# Patient Record
Sex: Male | Born: 1965 | Race: White | Hispanic: No | Marital: Married | Smoking: Never smoker
Health system: Southern US, Community
[De-identification: ages and names within clinical notes are randomized; demographics above are authoritative.]

## PROBLEM LIST (undated history)

## (undated) DIAGNOSIS — I4891 Unspecified atrial fibrillation: Secondary | ICD-10-CM

## (undated) DIAGNOSIS — I639 Cerebral infarction, unspecified: Secondary | ICD-10-CM

## (undated) HISTORY — PX: APPENDECTOMY: SHX54

## (undated) HISTORY — PX: ELBOW FRACTURE SURGERY: SHX616

## (undated) HISTORY — PX: FEMUR FRACTURE SURGERY: SHX633

---

## 2008-03-05 ENCOUNTER — Encounter: Payer: Self-pay | Admitting: Emergency Medicine

## 2008-03-05 ENCOUNTER — Observation Stay (HOSPITAL_COMMUNITY): Admission: EM | Admit: 2008-03-05 | Discharge: 2008-03-06 | Payer: Self-pay | Admitting: Cardiology

## 2010-09-21 NOTE — Discharge Summary (Signed)
Carlos Nolan, FOXWORTH NO.:  [id number]   MEDICAL RECORD NO.:  [id number]          PATIENT TYPE:  INP   LOCATION:  4703                         FACILITY:  [facility]   PHYSICIAN:  Cristy Hilts. Jacinto Halim, MD       DATE OF BIRTH:  [birth year]   DATE OF ADMISSION:  03/05/2008  DATE OF DISCHARGE:  03/06/2008                               DISCHARGE SUMMARY   DISCHARGE DIAGNOSES:  1. Paroxysmal atrial fibrillation, direct current cardioversion to      sinus rhythm this admission.  2. Dyslipidemia with an HDL of 23 and LDL of 138.  3. 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   HOSPITAL COURSE:  The patient is a 45 year old who had a history of  atrial fibrillation related to a virus when he was 45 years old.  He  has had no recurrence.  He has had no other medical problems or serious  cardiac issues.  On Wednesday March 04, 2008, around 10:00 p.m., he  felt that his heart go out of rhythm.  He presented on March 05, 2008  to Urgent Care at [facility].  He was admitted and started on IV  heparin.  He did not require medicines for rate control as his rate was  controlled.  The patient was seen by Dr. Garen Lah on March 06, 2008.  He was cardioverted in the cath lab to sinus rhythm.  It was decided he  did not need a transesophageal echo.  He will be discharged on March 06, 2008, and will have an echocardiogram as an outpatient and then see  Dr. Garen Lah back.   His discharge medications were coated aspirin once a day.   LABORATORY DATA:  TSH is pending.  White count 7.7, hemoglobin 16.2,  hematocrit 46.3, and platelets 212.  Troponin is negative.  D-dimers  0.36.  Sodium 143, potassium 4.0, BUN 19, creatinine 0.9, HDL 23, and  LDL 138.  EKG shows atrial fibrillation, controlled ventricular  response.   DISPOSITION:  The patient is discharged in stable condition and I will  have an  echo next week and follow up with Dr. Garen Lah after that.      Abelino Derrick, P.A.      Cristy Hilts. Jacinto Halim, MD  Electronically Signed    LKK/MEDQ  D:  03/06/2008  T:  03/07/2008  Job:  [id number]   cc:   Sheliah Mends, MD

## 2010-09-24 NOTE — H&P (Signed)
Carlos Nolan, Carlos Nolan NO.:  [id number]   MEDICAL RECORD NO.:  [id number]          PATIENT TYPE:  INP   LOCATION:  4703                         FACILITY:  [facility]   PHYSICIAN:  Sheliah Mends, MD      DATE OF BIRTH:  [birth year]   DATE OF ADMISSION:  03/05/2008  DATE OF DISCHARGE:  03/06/2008                              HISTORY & PHYSICAL   INDICATION:  Paroxysmal atrial fibrillation.   PROCEDURE:  After informed consent was obtained, the patient was  scheduled for cardioversion.  He was started on conscious sedation using  fentanyl and Versed.  Subsequently, he received a direct synchronized  biphasic shock at 120 joules resulting in successful restoration of  sinus rhythm.   The procedure was completed without complications.      Sheliah Mends, MD  Electronically Signed     JE/MEDQ  D:  04/17/2008  T:  04/17/2008  Job:  [id number]

## 2011-02-08 LAB — DIFFERENTIAL
Basophils Absolute: 0
Basophils Absolute: 0.1
Basophils Relative: 1
Eosinophils Absolute: 0.6
Eosinophils Relative: 7 — ABNORMAL HIGH
Eosinophils Relative: 8 — ABNORMAL HIGH
Lymphocytes Relative: 24
Lymphocytes Relative: 32
Lymphs Abs: 1.9
Lymphs Abs: 2.3
Monocytes Absolute: 0.6
Monocytes Relative: 8
Neutro Abs: 3.6
Neutro Abs: 4.5
Neutrophils Relative %: 60

## 2011-02-08 LAB — BASIC METABOLIC PANEL
BUN: 19
CO2: 26
Sodium: 143

## 2011-02-08 LAB — COMPREHENSIVE METABOLIC PANEL
AST: 24
Albumin: 3.4 — ABNORMAL LOW
BUN: 13
CO2: 29
Calcium: 9.2
Creatinine, Ser: 0.99
GFR calc Af Amer: >60
GFR calc non Af Amer: >60

## 2011-02-08 LAB — POCT CARDIAC MARKERS
CKMB, poc: 1.2
Myoglobin, poc: 45.2
Troponin i, poc: <0.05

## 2011-02-08 LAB — LIPID PANEL
Cholesterol: 171
HDL: 23 — ABNORMAL LOW

## 2011-02-08 LAB — CBC
HCT: 44.6
HCT: 46.3
Hemoglobin: 16.2
MCHC: 34.7
MCHC: 35.1
MCV: 88.6
MCV: 90.6
Platelets: 175
Platelets: 212
RBC: 5.23
RDW: 12
WBC: 7.7

## 2011-02-08 LAB — CARDIAC PANEL(CRET KIN+CKTOT+MB+TROPI)
CK, MB: 1.2
Relative Index: INVALID
Relative Index: INVALID
Total CK: 43
Troponin I: 0.01
Troponin I: 0.01

## 2011-02-08 LAB — B-NATRIURETIC PEPTIDE (CONVERTED LAB): Pro B Natriuretic peptide (BNP): 160 — ABNORMAL HIGH

## 2011-02-08 LAB — BASIC METABOLIC PANEL WITH GFR
Calcium: 10.2
Chloride: 104
Creatinine, Ser: 0.9
GFR calc Af Amer: >60
GFR calc non Af Amer: >60
Glucose, Bld: 94
Potassium: 4

## 2011-02-08 LAB — TSH: TSH: 4.73 — ABNORMAL HIGH

## 2011-02-08 LAB — APTT: aPTT: 152 — ABNORMAL HIGH

## 2011-02-08 LAB — PROTIME-INR
INR: 1.2
Prothrombin Time: 15.1

## 2011-02-08 LAB — D-DIMER, QUANTITATIVE: D-Dimer, Quant: 0.36

## 2011-02-08 LAB — HEPARIN LEVEL (UNFRACTIONATED): Heparin Unfractionated: 0.59

## 2012-11-16 ENCOUNTER — Emergency Department: Payer: Self-pay | Admitting: Emergency Medicine

## 2012-11-16 LAB — CBC
HGB: 16.1 g/dL (ref 13.0–18.0)
MCHC: 34.4 g/dL (ref 32.0–36.0)
MCV: 90 fL (ref 80–100)
Platelet: 169 10*3/uL (ref 150–440)
RDW: 12.7 % (ref 11.5–14.5)

## 2012-11-16 LAB — BASIC METABOLIC PANEL
Chloride: 105 mmol/L (ref 98–107)
EGFR (African American): >60
EGFR (Non-African Amer.): >60
Glucose: 98 mg/dL (ref 65–99)

## 2012-11-16 LAB — TROPONIN I: Troponin-I: <0.02 ng/mL

## 2012-11-16 LAB — CK TOTAL AND CKMB (NOT AT ARMC): CK, Total: 71 U/L (ref 35–232)

## 2012-12-27 ENCOUNTER — Ambulatory Visit: Payer: Self-pay | Admitting: Cardiology

## 2012-12-27 LAB — PROTIME-INR: INR: 2.3

## 2013-01-14 ENCOUNTER — Ambulatory Visit: Payer: Self-pay | Admitting: Cardiology

## 2013-08-23 ENCOUNTER — Other Ambulatory Visit: Payer: Self-pay | Admitting: Cardiology

## 2015-04-24 HISTORY — PX: CARDIAC ELECTROPHYSIOLOGY STUDY AND ABLATION: SHX1294

## 2016-01-10 ENCOUNTER — Emergency Department
Admission: EM | Admit: 2016-01-10 | Discharge: 2016-01-10 | Payer: 59 | Attending: Emergency Medicine | Admitting: Emergency Medicine

## 2016-01-10 ENCOUNTER — Ambulatory Visit (HOSPITAL_COMMUNITY)
Admission: EM | Admit: 2016-01-10 | Discharge: 2016-01-11 | Disposition: A | Discharge status: Home / Self Care | Payer: 59 | Attending: Gastroenterology | Admitting: Gastroenterology

## 2016-01-10 ENCOUNTER — Encounter: Payer: Self-pay | Admitting: Emergency Medicine

## 2016-01-10 DIAGNOSIS — Y999 Unspecified external cause status: Secondary | ICD-10-CM | POA: Diagnosis not present

## 2016-01-10 DIAGNOSIS — I4891 Unspecified atrial fibrillation: Secondary | ICD-10-CM | POA: Insufficient documentation

## 2016-01-10 DIAGNOSIS — Y9389 Activity, other specified: Secondary | ICD-10-CM | POA: Insufficient documentation

## 2016-01-10 DIAGNOSIS — Y92511 Restaurant or cafe as the place of occurrence of the external cause: Secondary | ICD-10-CM | POA: Diagnosis not present

## 2016-01-10 DIAGNOSIS — T18128A Food in esophagus causing other injury, initial encounter: Secondary | ICD-10-CM | POA: Diagnosis not present

## 2016-01-10 DIAGNOSIS — K222 Esophageal obstruction: Secondary | ICD-10-CM | POA: Diagnosis not present

## 2016-01-10 DIAGNOSIS — X58XXXA Exposure to other specified factors, initial encounter: Secondary | ICD-10-CM | POA: Insufficient documentation

## 2016-01-10 DIAGNOSIS — Z79899 Other long term (current) drug therapy: Secondary | ICD-10-CM | POA: Diagnosis not present

## 2016-01-10 DIAGNOSIS — T189XXA Foreign body of alimentary tract, part unspecified, initial encounter: Secondary | ICD-10-CM | POA: Diagnosis present

## 2016-01-10 HISTORY — DX: Unspecified atrial fibrillation: I48.91

## 2016-01-10 LAB — CBC WITH DIFFERENTIAL/PLATELET
Basophils Absolute: 0 10*3/uL (ref 0–0.1)
Basophils Relative: 1 %
EOS ABS: 0.5 10*3/uL (ref 0–0.7)
EOS PCT: 8 %
HCT: 43.8 % (ref 40.0–52.0)
Hemoglobin: 15.5 g/dL (ref 13.0–18.0)
LYMPHS ABS: 1.7 10*3/uL (ref 1.0–3.6)
Lymphocytes Relative: 24 %
MCH: 31.8 pg (ref 26.0–34.0)
MCHC: 35.4 g/dL (ref 32.0–36.0)
MCV: 89.8 fL (ref 80.0–100.0)
MONOS PCT: 7 %
Monocytes Absolute: 0.5 10*3/uL (ref 0.2–1.0)
Neutro Abs: 4.4 10*3/uL (ref 1.4–6.5)
Neutrophils Relative %: 62 %
PLATELETS: 164 10*3/uL (ref 150–440)
RBC: 4.88 MIL/uL (ref 4.40–5.90)
RDW: 12.7 % (ref 11.5–14.5)
WBC: 7.2 10*3/uL (ref 3.8–10.6)

## 2016-01-10 LAB — BASIC METABOLIC PANEL
Anion gap: 5 (ref 5–15)
BUN: 21 mg/dL — AB (ref 6–20)
CO2: 28 mmol/L (ref 22–32)
CREATININE: 1.04 mg/dL (ref 0.61–1.24)
Calcium: 9.2 mg/dL (ref 8.9–10.3)
Chloride: 107 mmol/L (ref 101–111)
GFR calc Af Amer: >60 mL/min (ref >60)
Glucose, Bld: 150 mg/dL — ABNORMAL HIGH (ref 65–99)
Potassium: 3.6 mmol/L (ref 3.5–5.1)
SODIUM: 140 mmol/L (ref 135–145)

## 2016-01-10 MED ORDER — NITROGLYCERIN 0.4 MG SL SUBL
0.4000 mg | SUBLINGUAL_TABLET | Freq: Once | SUBLINGUAL | Status: AC
  Administered 2016-01-10: 0.4 mg via SUBLINGUAL
  Filled 2016-01-10: qty 1

## 2016-01-10 MED ORDER — GLUCAGON HCL RDNA (DIAGNOSTIC) 1 MG IJ SOLR
1.0000 mg | Freq: Once | INTRAMUSCULAR | Status: AC
  Administered 2016-01-10: 1 mg via INTRAVENOUS
  Filled 2016-01-10: qty 1

## 2016-01-10 NOTE — ED Notes (Signed)
Pt transported to [facility]

## 2016-01-10 NOTE — ED Notes (Signed)
Bed: [id number] Expected date:  Expected time:  Means of arrival:  Comments: Tx from [facility] impaction

## 2016-01-10 NOTE — ED Notes (Signed)
Pt states was eating at a restaurant and believes he has either a piece of steak or shrimp in his throat. Dr attempted to have pt drink warm coke to help it go down without success. Iv ordered.

## 2016-01-10 NOTE — ED Provider Notes (Signed)
[facility] Emergency Department Provider Note   ____________________________________________   First MD Initiated Contact with Patient 01/10/16 2044     (approximate)  I have reviewed the triage vital signs and the nursing notes.   HISTORY  Chief Complaint Foreign Body   HPI NAFTALI BIELENBERG is a 50 y.o. male with a history of atrial fibrillation who is presenting to the emergency department today after feeling of a steak chunk being stuck in his throat. He says he was eating at the [employer] in the hospital when he was swallowing a piece of steak and he felt like he became lodged in his neck. He says that he has tried water as well as cold Coca-Cola and has vomited both up.  Denies being on a blood thinner despite being a patient with atrial fibrillation. Says that he has vomited every time he tries to drink something.   Past Medical History:  Diagnosis Date  . Atrial fibrillation ([place])     There are no active problems to display for this patient.   Past Surgical History:  Procedure Laterality Date  . APPENDECTOMY    . ELBOW FRACTURE SURGERY    . FEMUR FRACTURE SURGERY      Prior to Admission medications   Not on File    Allergies Penicillins  No family history on file.  Social History Social History  Substance Use Topics  . Smoking status: Never Smoker  . Smokeless tobacco: Never Used  . Alcohol use Yes    Review of Systems Constitutional: No fever/chills Eyes: No visual changes. ENT: No sore throat. Cardiovascular: Denies chest pain. Respiratory: Denies shortness of breath. Gastrointestinal: No abdominal pain.   No diarrhea.  No constipation. Genitourinary: Negative for dysuria. Musculoskeletal: Negative for back pain. Skin: Negative for rash. Neurological: Negative for headaches, focal weakness or numbness.  10-point ROS otherwise negative.  ____________________________________________   PHYSICAL  EXAM:  VITAL SIGNS: ED Triage Vitals  Enc Vitals Group     BP 01/10/16 2028 128/85     Pulse Rate 01/10/16 2028 72     Resp 01/10/16 2028 18     Temp 01/10/16 2028 98 F (36.7 C)     Temp Source 01/10/16 2028 Oral     SpO2 01/10/16 2028 97 %     Weight 01/10/16 2028 235 lb (106.6 kg)     Height 01/10/16 2028 6\' 1"  (1.854 m)     Head Circumference --      Peak Flow --      Pain Score 01/10/16 2032 6     Pain Loc --      Pain Edu? --      Excl. in [place]? --     Constitutional: Alert and oriented. Well appearing and in no acute distress.Patient is controlling his secretions. Eyes: Conjunctivae are normal. PERRL. EOMI. Head: Atraumatic. Nose: No congestion/rhinnorhea. Mouth/Throat: Mucous membranes are moist.  Oropharynx non-erythematous. Neck: No stridor.   Cardiovascular: Normal rate, regular rhythm. Grossly normal heart sounds.   Respiratory: Normal respiratory effort.  No retractions. Lungs CTAB. Gastrointestinal: Soft and nontender. No distention.  Musculoskeletal: No lower extremity tenderness nor edema.  No joint effusions. Neurologic:  Normal speech and language. No gross focal neurologic deficits are appreciated. No gait instability. Skin:  Skin is warm, dry and intact. No rash noted. Psychiatric: Mood and affect are normal. Speech and behavior are normal.  ____________________________________________   LABS (all labs ordered are listed, but only abnormal results are displayed)  Labs Reviewed  CBC WITH DIFFERENTIAL/PLATELET  BASIC METABOLIC PANEL  TYPE AND SCREEN   ____________________________________________  EKG   ____________________________________________  RADIOLOGY   ____________________________________________   PROCEDURES  Procedure(s) performed:   Procedures  Critical Care performed:   ____________________________________________   INITIAL IMPRESSION / ASSESSMENT AND PLAN / ED COURSE  Pertinent labs & imaging results that were  available during my care of the patient were reviewed by me and considered in my medical decision making (see chart for details).  ----------------------------------------- 8:56 PM on 01/10/2016 -----------------------------------------  Patient admitted to drink warm Coca-Cola without success. Vomited back up forcefully. No GI coverage here at [facility] today. Discussed the case with the ENT doctor Dr. Linna Darner, who says that the patient would have greater benefit with GI and did procedure that he would have to do for the patient would incur much more risk and would be under general anesthesia. ----------------------------------------- 9:48 PM on 01/10/2016 -----------------------------------------  After glucagon and nitroglycerin the patient is still having the sensation of the foreign bite. I also tried having a repeat drinking diet: After the glucose and nitroglycerin without any resolution of the symptoms. He again forcefully vomited up this liquid. I discussed the case with Dr. Arvid Right at [facility] in [place] who says that the unassigned gastroenterologist for the evening for transfer is Dr. Silverio Decamp.  The latter document except for the patient for transfer to the [facility] emergency department. Dr. Leonette Monarch of the [facility] emergency department except the patient for transfer. I splinted the need for transfer to the patient as well as his wife was in the room as well. They understand the plan and are willing to comply. [place] the need for transfer. He spent them that we do not have gastric neurology at [facility] at this time.  Clinical Course     ____________________________________________   FINAL CLINICAL IMPRESSION(S) / ED DIAGNOSES  Esophageal impaction.    NEW MEDICATIONS STARTED DURING THIS VISIT:  New Prescriptions   No medications on file     Note:  This document was prepared using Dragon voice recognition software and may include unintentional dictation  errors.    Orbie Pyo, MD 01/10/16 2149

## 2016-01-10 NOTE — ED Notes (Signed)
Medications have not relieved symptoms. Pt to be transferred.

## 2016-01-10 NOTE — ED Triage Notes (Signed)
Pt presents to ED with possible foreign body in his throat. Pt states he was eating dinner at [facility] around 1800 and feels like there is a piece of steak stuck in his throat. Pt states he attempted to drink water to wash it down but had difficulty swallowing the water and ended up vomiting instead. Pt states he has vomited 4-5 times since onset. Pt states piece of meat has not moved. Pt currently speaking clearly with no increased work of breathing noted at this time. States he has to spit out his saliva. Appears uncomfortable.

## 2016-01-10 NOTE — ED Notes (Signed)
Report called to [place] - awaiting transport

## 2016-01-11 ENCOUNTER — Encounter (HOSPITAL_COMMUNITY): Payer: Self-pay | Admitting: Emergency Medicine

## 2016-01-11 ENCOUNTER — Encounter (HOSPITAL_COMMUNITY): Admission: EM | Disposition: A | Discharge status: Home / Self Care | Payer: Self-pay | Source: Home / Self Care

## 2016-01-11 DIAGNOSIS — T18128A Food in esophagus causing other injury, initial encounter: Secondary | ICD-10-CM | POA: Diagnosis not present

## 2016-01-11 DIAGNOSIS — W44F3XA Food entering into or through a natural orifice, initial encounter: Secondary | ICD-10-CM | POA: Insufficient documentation

## 2016-01-11 HISTORY — PX: ESOPHAGOGASTRODUODENOSCOPY: SHX5428

## 2016-01-11 SURGERY — EGD (ESOPHAGOGASTRODUODENOSCOPY)
Anesthesia: Moderate Sedation

## 2016-01-11 MED ORDER — MIDAZOLAM HCL 5 MG/ML IJ SOLN
INTRAMUSCULAR | Status: AC
Start: 2016-01-11 — End: 2016-01-11
  Filled 2016-01-11: qty 2

## 2016-01-11 MED ORDER — DIPHENHYDRAMINE HCL 50 MG/ML IJ SOLN
INTRAMUSCULAR | Status: AC
  Filled 2016-01-11: qty 1

## 2016-01-11 MED ORDER — BUTAMBEN-TETRACAINE-BENZOCAINE 2-2-14 % EX AERO
INHALATION_SPRAY | CUTANEOUS | Status: DC | PRN
  Administered 2016-01-11: 2 via TOPICAL

## 2016-01-11 MED ORDER — FENTANYL CITRATE (PF) 100 MCG/2ML IJ SOLN
INTRAMUSCULAR | Status: AC
  Filled 2016-01-11: qty 2

## 2016-01-11 MED ORDER — MIDAZOLAM HCL 10 MG/2ML IJ SOLN
INTRAMUSCULAR | Status: DC | PRN
  Administered 2016-01-11: 1 mg via INTRAVENOUS
  Administered 2016-01-11 (×3): 2 mg via INTRAVENOUS
  Administered 2016-01-11: 1 mg via INTRAVENOUS

## 2016-01-11 MED ORDER — DIPHENHYDRAMINE HCL 50 MG/ML IJ SOLN
INTRAMUSCULAR | Status: DC | PRN
  Administered 2016-01-11: 12.5 mg via INTRAVENOUS

## 2016-01-11 MED ORDER — FENTANYL CITRATE (PF) 100 MCG/2ML IJ SOLN
INTRAMUSCULAR | Status: DC | PRN
  Administered 2016-01-11 (×4): 25 ug via INTRAVENOUS

## 2016-01-11 NOTE — ED Triage Notes (Signed)
Pt transferred from [facility] for further evaluation and treatment of his food impaction in throat.  Pt reports that he has not been able to take anything down since "food" is stuck in his throat.  Arrived to ED A/Ox4, in no s/s acute respiratory distress noted.

## 2016-01-11 NOTE — ED Notes (Signed)
Upper Endoscopy completed at this time.

## 2016-01-11 NOTE — H&P (Signed)
Consultation  Referring Provider:     ER physician Primary Care Physician:  No PCP Per Patient Primary Gastroenterologist:     [facility]    Reason for Consultation:     Food impaction            HPI:   Carlos Nolan is a 50 y.o. male is transferred here from [facility] ER after he presented with episode of food impaction after eating steak this evening around 6 PM. He is not able to swallow anything since then. .Denies chest pain, abd pain, nausea or vomiting. No prior episodes  Past Medical History:  Diagnosis Date  . Atrial fibrillation [facility])     Past Surgical History:  Procedure Laterality Date  . APPENDECTOMY    . ELBOW FRACTURE SURGERY    . FEMUR FRACTURE SURGERY      History reviewed. No pertinent family history.   Social History  Substance Use Topics  . Smoking status: Never Smoker  . Smokeless tobacco: Never Used  . Alcohol use Yes    Prior to Admission medications   Medication Sig Start Date End Date Taking? Authorizing Provider  cefdinir (OMNICEF) 300 MG capsule Take 300 mg by mouth 2 (two) times daily. Last Monday one dose today only 10 days 01/04/16  Yes Historical Provider, MD  dofetilide (TIKOSYN) 500 MCG capsule Take 500 mcg by mouth 2 (two) times daily. 01/10/16  Yes Historical Provider, MD  HYDROcodone-homatropine (HYCODAN) 5-1.5 MG/5ML syrup Take 5 mLs by mouth every 4 (four) hours as needed for cough. 01/04/16  Yes Historical Provider, MD  magnesium gluconate (MAGONATE) 500 MG tablet Take 500 mg by mouth 2 (two) times daily.   Yes Historical Provider, MD    No current facility-administered medications for this encounter.    Current Outpatient Prescriptions  Medication Sig Dispense Refill  . cefdinir (OMNICEF) 300 MG capsule Take 300 mg by mouth 2 (two) times daily. Last Monday one dose today only 10 days    . dofetilide (TIKOSYN) 500 MCG capsule Take 500 mcg by mouth 2 (two) times daily.    Marland Kitchen HYDROcodone-homatropine (HYCODAN) 5-1.5 MG/5ML syrup Take 5 mLs  by mouth every 4 (four) hours as needed for cough.    . magnesium gluconate (MAGONATE) 500 MG tablet Take 500 mg by mouth 2 (two) times daily.      Allergies as of 01/10/2016 - Review Complete 01/10/2016  Allergen Reaction Noted  . Penicillins Rash 01/10/2016     Review of Systems:    This is positive for those things mentioned in the HPI All other review of systems are negative.       Physical Exam:  Vital signs in last 24 hours: Temp:  [98 F (36.7 C)-98.7 F (37.1 C)] 98 F (36.7 C) (09/04 0005) Pulse Rate:  [64-78] 73 (09/04 0130) Resp:  [16-20] 20 (09/04 0005) BP: (140-155)/(78-96) 155/91 (09/04 0138) SpO2:  [96 %-100 %] 99 % (09/04 0138) Weight:  [235 lb (106.6 kg)] 235 lb (106.6 kg) (09/04 0004)    General:  Well-developed, well-nourished and in no acute distress Eyes:  anicteric. Lungs: Clear to auscultation bilaterally. Heart:  S1S2, no rubs, murmurs, gallops. Abdomen:  soft, non-tender, and BS+.  Extremities:   no edema Neuro:  A&O x 3.  Psych:  appropriate mood and  Affect.   Data Reviewed:   LAB RESULTS:  Recent Labs  01/10/16 2147  WBC 7.2  HGB 15.5  HCT 43.8  PLT 164   BMET  Recent Labs  01/10/16 2147  NA 140  K 3.6  CL 107  CO2 28  GLUCOSE 150*  BUN 21*  CREATININE 1.04  CALCIUM 9.2    PREVIOUS ENDOSCOPIES:            None    Impression / Plan:   62 yr M here for acute episodes of food impaction Will proceed with emergent EGD The risks and benefits as well as alternatives of endoscopic procedure(s) have been discussed and reviewed. All questions answered. The patient agrees to proceed.       Damaris Hippo , MD [phone] Mon-Fri 8a-5p [phone] after 5p, weekends, holidays  @  01/11/2016, 2:06 AM

## 2016-01-11 NOTE — ED Notes (Signed)
Dr. Palumbo at bedside. 

## 2016-01-11 NOTE — ED Notes (Signed)
Gastroenterologist at bedside at this time.

## 2016-01-11 NOTE — ED Provider Notes (Signed)
MSE was initiated and I personally evaluated the patient and placed orders (if any) at  12:09 AM on January 11, 2016.  The patient appears stable so that the remainder of the MSE may be completed by another provider.  NCAT PERRL MMM, no visible impaction RRR CTAB NABS FROMX4  GI called   Keene Gilkey, MD 01/11/16 0010

## 2016-01-11 NOTE — ED Notes (Signed)
Bed: [id number] Expected date:  Expected time:  Means of arrival:  Comments: Pt in endo.Marland KitchenMarland Kitchen

## 2016-01-11 NOTE — Op Note (Signed)
[facility] Patient Name: Carlos Nolan Procedure Date: 01/11/2016 MRN: [id number] Attending MD: Mauri Pole , MD Date of Birth: Jan 23, 1966 CSN: [id number] Age: 50 Admit Type: Outpatient Procedure:                Upper GI endoscopy Indications:              Dysphagia, Removal of foreign body in the esophagus Providers:                Mauri Pole, MD, Sarah Monday RN, RN, Alfonso Patten, Technician Referring MD:              Medicines:                Fentanyl 200 micrograms IV, Midazolam 8 mg IV,                            Diphenhydramine [id number] mg IV Complications:            No immediate complications. Estimated Blood Loss:     Estimated blood loss was minimal. Procedure:                Pre-Anesthesia Assessment:                           - Prior to the procedure, a History and Physical                            was performed, and patient medications and                            allergies were reviewed. The patient's tolerance of                            previous anesthesia was also reviewed. The risks                            and benefits of the procedure and the sedation                            options and risks were discussed with the patient.                            All questions were answered, and informed consent                            was obtained. Prior Anticoagulants: The patient has                            taken no previous anticoagulant or antiplatelet                            agents. ASA Grade Assessment: II - A patient with  mild systemic disease. After reviewing the risks                            and benefits, the patient was deemed in                            satisfactory condition to undergo the procedure.                           After obtaining informed consent, the endoscope was                            passed under direct vision. Throughout the                 procedure, the patient's blood pressure, pulse, and                            oxygen saturations were monitored continuously. The                            Endoscope was introduced through the mouth, and                            advanced to the body of the stomach. The upper GI                            endoscopy was accomplished without difficulty. The                            patient tolerated the procedure well. Scope In: Scope Out: Findings:      Food was found in the impacted in the distal esophagus. Was able to       remove in piecemeal with [place] and roth net and subsequently was able       to gently push some part of the bolus down into stomach. Successful       removal of food was accomplished. Noted possible stricture ~16-17 mm at       EG junction, with minimal oozing of blood      A large amount of food (residue) was found in the gastric body and scope       was not advanced further. Impression:               - Food in the distal esophagus. Removal was                            successful. Possible distal esophageal stricture                           - A large amount of food (residue) in the stomach. Moderate Sedation:      Moderate (conscious) sedation was administered by the endoscopy nurse       and supervised by the endoscopist. The following parameters were       monitored: oxygen saturation, heart rate, blood pressure, and response       to care. Total physician intraservice time was  20 minutes. Recommendation:           - Patient has a contact number available for                            emergencies. The signs and symptoms of potential                            delayed complications were discussed with the                            patient. Return to normal activities tomorrow.                            Written discharge instructions were provided to the                            patient.                           - Mechanical soft diet  for 2 weeks.                           - Chopped diet indefinitely.                           - No ibuprofen, naproxen, or other non-steroidal                            anti-inflammatory drugs.                           - Continue present medications.                           - Repeat upper endoscopy in 2-4 weeks for                            evaluation and dilation of the possible distal                            esophageal stricture.                           - Return to GI clinic PRN. Procedure Code(s):        --- Professional ---                           [phone], 50, Esophagogastroduodenoscopy, flexible,                            transoral; with removal of foreign body(s)                           G0500, Moderate sedation services provided by the  same physician or other qualified health care                            professional performing a gastrointestinal                            endoscopic service that sedation supports,                            requiring the presence of an independent trained                            observer to assist in the monitoring of the                            patient's level of consciousness and physiological                            status; initial 15 minutes of intra-service time;                            patient age 19 years or older (additional time may                            be reported with [phone], as appropriate) Diagnosis Code(s):        --- Professional ---                           [phone], Food in esophagus causing other injury,                            initial encounter                           T18.2XXA, Foreign body in stomach, initial encounter                           R13.10, Dysphagia, unspecified                           T18.108A, Unspecified foreign body in esophagus                            causing other injury, initial encounter CPT copyright 2016 [employer].  All rights reserved. The codes documented in this report are preliminary and upon coder review may  be revised to meet current compliance requirements. Mauri Pole, MD 01/11/2016 2:45:01 AM This report has been signed electronically. Number of Addenda: 0

## 2016-01-12 ENCOUNTER — Encounter (HOSPITAL_COMMUNITY): Payer: Self-pay | Admitting: Gastroenterology

## 2016-01-19 ENCOUNTER — Ambulatory Visit (INDEPENDENT_AMBULATORY_CARE_PROVIDER_SITE_OTHER): Payer: 59 | Admitting: Gastroenterology

## 2016-01-19 ENCOUNTER — Encounter: Payer: Self-pay | Admitting: Gastroenterology

## 2016-01-19 VITALS — BP 120/80 | HR 60 | Ht 73.0 in | Wt 238.2 lb

## 2016-01-19 DIAGNOSIS — R1310 Dysphagia, unspecified: Secondary | ICD-10-CM

## 2016-01-19 DIAGNOSIS — T18128D Food in esophagus causing other injury, subsequent encounter: Secondary | ICD-10-CM | POA: Diagnosis not present

## 2016-01-19 NOTE — Patient Instructions (Signed)
You have been scheduled for an endoscopy. Please follow written instructions given to you at your visit today. If you use inhalers (even only as needed), please bring them with you on the day of your procedure. Your physician has requested that you go to www.startemmi.com and enter the access code given to you at your visit today. This web site gives a general overview about your procedure. However, you should still follow specific instructions given to you by our office regarding your preparation for the procedure.  Follow up as needed

## 2016-01-19 NOTE — Progress Notes (Signed)
Carlos Nolan    [id number]    [birth year]  Primary Care Physician:No PCP Per Patient  Referring Physician: No referring provider defined for this encounter.  Chief complaint:  H/o food impaction  HPI: 50 year old male here for follow-up visit after recent episode of acute food impaction on September 4 that was removed from distal esophagus,  noted to have a possible distal esophageal stricture near the EG junction. He has been very careful with his diet since that episode and has been avoiding meat. Denies any further episodes, no heartburn, regurgitation or reflux symptoms. Denies any nausea, vomiting, abdominal pain, melena or bright red blood per rectum     Outpatient Encounter Prescriptions as of 01/19/2016  Medication Sig  . dofetilide (TIKOSYN) 500 MCG capsule Take 500 mcg by mouth 2 (two) times daily.  . magnesium gluconate (MAGONATE) 500 MG tablet Take 500 mg by mouth 2 (two) times daily.  . [DISCONTINUED] cefdinir (OMNICEF) 300 MG capsule Take 300 mg by mouth 2 (two) times daily. Last Monday one dose today only 10 days  . [DISCONTINUED] HYDROcodone-homatropine (HYCODAN) 5-1.5 MG/5ML syrup Take 5 mLs by mouth every 4 (four) hours as needed for cough.   No facility-administered encounter medications on file as of 01/19/2016.     Allergies as of 01/19/2016 - Review Complete 01/11/2016  Allergen Reaction Noted  . Penicillins Rash 01/10/2016  . Quinidine Rash 03/26/2015    Past Medical History:  Diagnosis Date  . Atrial fibrillation [facility])     Past Surgical History:  Procedure Laterality Date  . APPENDECTOMY    . ELBOW FRACTURE SURGERY    . ESOPHAGOGASTRODUODENOSCOPY N/A 01/11/2016   Procedure: ESOPHAGOGASTRODUODENOSCOPY (EGD);  Surgeon: Mauri Pole, MD;  Location: [facility];  Service: Endoscopy;  Laterality: N/A;  . FEMUR FRACTURE SURGERY      History reviewed. No pertinent family history.  Social History   Social History  . Marital  status: Married    Spouse name: N/A  . Number of children: N/A  . Years of education: N/A   Occupational History  . Not on file.   Social History Main Topics  . Smoking status: Never Smoker  . Smokeless tobacco: Never Used  . Alcohol use Yes  . Drug use: No  . Sexual activity: Not on file   Other Topics Concern  . Not on file   Social History Narrative  . No narrative on file      Review of systems: Review of Systems  Constitutional: Negative for fever and chills.  HENT: Negative.   Eyes: Negative for blurred vision.  Respiratory: Negative for cough, shortness of breath and wheezing.   Cardiovascular: Negative for chest pain and palpitations.  Gastrointestinal: as per HPI Genitourinary: Negative for dysuria, urgency, frequency and hematuria.  Musculoskeletal: Negative for myalgias, back pain and joint pain.  Skin: Negative for itching and rash.  Neurological: Negative for dizziness, tremors, focal weakness, seizures and loss of consciousness.  Endo/Heme/Allergies: Positive for seasonal allergies.  Psychiatric/Behavioral: Negative for depression, suicidal ideas and hallucinations.  All other systems reviewed and are negative.   Physical Exam: Vitals:   01/19/16 1533  BP: 120/80  Pulse: 60   Body mass index is 31.43 kg/m. Gen:      No acute distress HEENT:  EOMI, sclera anicteric Neck:     No masses; no thyromegaly Lungs:    Clear to auscultation bilaterally; normal respiratory effort CV:  Regular rate and rhythm; no murmurs Abd:      + bowel sounds; soft, non-tender; no palpable masses, no distension Ext:    No edema; adequate peripheral perfusion Skin:      Warm and dry; no rash Neuro: alert and oriented x 3 Psych: normal mood and affect  Data Reviewed:  Reviewed chart in epic   Assessment and Plan/Recommendations:  50 year old male here for follow-up status post recent episode of acute food impaction that was dislodged with EGD. We will  proceed with scheduling repeat EGD with biopsies and possible dilation. Will need to exclude eosinophilic esophagitis Advise patient to continue to avoid meat, chew food well and small bites He is up-to-date with screening colonoscopy, normal per patient, was done with his local gastroenterologist last year 15 minutes was spent face-to-face with the patient. Greater than 50% of the time used for counseling as well as treatment plan and follow-up. She had multiple questions which were answered to her satisfaction  K. Denzil Magnuson , MD [phone] Mon-Fri 8a-5p [phone] after 5p, weekends, holidays  CC: No ref. provider found

## 2016-01-21 ENCOUNTER — Encounter: Payer: Self-pay | Admitting: Gastroenterology

## 2016-01-25 ENCOUNTER — Encounter: Payer: 59 | Admitting: Gastroenterology

## 2016-01-27 ENCOUNTER — Encounter: Payer: Self-pay | Admitting: Gastroenterology

## 2016-01-27 ENCOUNTER — Ambulatory Visit (AMBULATORY_SURGERY_CENTER): Payer: 59 | Admitting: Gastroenterology

## 2016-01-27 VITALS — BP 144/90 | HR 58 | Temp 98.7°F | Resp 15 | Ht 73.0 in | Wt 238.0 lb

## 2016-01-27 DIAGNOSIS — R1310 Dysphagia, unspecified: Secondary | ICD-10-CM

## 2016-01-27 DIAGNOSIS — K200 Eosinophilic esophagitis: Secondary | ICD-10-CM | POA: Diagnosis not present

## 2016-01-27 DIAGNOSIS — K222 Esophageal obstruction: Secondary | ICD-10-CM | POA: Diagnosis not present

## 2016-01-27 MED ORDER — LIDOCAINE VISCOUS 2 % MT SOLN: 20.0000 mL | OROMUCOSAL | 1 refills | Status: DC | PRN

## 2016-01-27 MED ORDER — SODIUM CHLORIDE 0.9 % IV SOLN: 500.0000 mL | INTRAVENOUS | Status: DC

## 2016-01-27 MED ORDER — OMEPRAZOLE 40 MG PO CPDR: 40.0000 mg | DELAYED_RELEASE_CAPSULE | Freq: Every day | ORAL | 11 refills | Status: DC

## 2016-01-27 NOTE — Progress Notes (Signed)
Called to room to assist during endoscopic procedure.  Patient ID and intended procedure confirmed with present staff. Received instructions for my participation in the procedure from the performing physician.  

## 2016-01-27 NOTE — Patient Instructions (Addendum)
Biopsies taken today. Result letter in your mail in 2-3 weeks. NO aspirin, ibuprofen, naproxen, or any other non-steriodal anti-inflammatory medications. Clear liquids for 1 hour, then soft diet rest of today. May resume your previous diet tomorrow. Call us with any questions or concerns. Thank you!!   YOU HAD AN ENDOSCOPIC PROCEDURE TODAY AT [facility]:   Refer to the procedure report that was given to you for any specific questions about what was found during the examination.  If the procedure report does not answer your questions, please call your gastroenterologist to clarify.  If you requested that your care partner not be given the details of your procedure findings, then the procedure report has been included in a sealed envelope for you to review at your convenience later.  YOU SHOULD EXPECT: Some feelings of bloating in the abdomen. Passage of more gas than usual.  Walking can help get rid of the air that was put into your GI tract during the procedure and reduce the bloating. If you had a lower endoscopy (such as a colonoscopy or flexible sigmoidoscopy) you may notice spotting of blood in your stool or on the toilet paper. If you underwent a bowel prep for your procedure, you may not have a normal bowel movement for a few days.  Please Note:  You might notice some irritation and congestion in your nose or some drainage.  This is from the oxygen used during your procedure.  There is no need for concern and it should clear up in a day or so.  SYMPTOMS TO REPORT IMMEDIATELY:    Following upper endoscopy (EGD)  Vomiting of blood or coffee ground material  New chest pain or pain under the shoulder blades  Painful or persistently difficult swallowing  New shortness of breath  Fever of 100F or higher  Black, tarry-looking stools  For urgent or emergent issues, a gastroenterologist can be reached at any hour by calling [phone].   DIET: dilation diet today, see  handout given. Drink plenty of fluids but you should avoid alcoholic beverages for 24 hours.  ACTIVITY:  You should plan to take it easy for the rest of today and you should NOT DRIVE or use heavy machinery until tomorrow (because of the sedation medicines used during the test).    FOLLOW UP: Our staff will call the number listed on your records the next business day following your procedure to check on you and address any questions or concerns that you may have regarding the information given to you following your procedure. If we do not reach you, we will leave a message.  However, if you are feeling well and you are not experiencing any problems, there is no need to return our call.  We will assume that you have returned to your regular daily activities without incident.  If any biopsies were taken you will be contacted by phone or by letter within the next 1-3 weeks.  Please call us at [phone] if you have not heard about the biopsies in 3 weeks.    SIGNATURES/CONFIDENTIALITY: You and/or your care partner have signed paperwork which will be entered into your electronic medical record.  These signatures attest to the fact that that the information above on your After Visit Summary has been reviewed and is understood.  Full responsibility of the confidentiality of this discharge information lies with you and/or your care-partner.

## 2016-01-27 NOTE — Op Note (Addendum)
[place] Patient Name: Carlos Nolan Procedure Date: 01/27/2016 1:15 PM MRN: [id number] Endoscopist: Mauri Pole , MD Age: 50 Referring MD:  Date of Birth: [birth year] Gender: Male Account #: [id number] Procedure:                Upper GI endoscopy Indications:              Dysphagia Medicines:                Monitored Anesthesia Care Procedure:                Pre-Anesthesia Assessment:                           - Prior to the procedure, a History and Physical                            was performed, and patient medications and                            allergies were reviewed. The patient's tolerance of                            previous anesthesia was also reviewed. The risks                            and benefits of the procedure and the sedation                            options and risks were discussed with the patient.                            All questions were answered, and informed consent                            was obtained. Prior Anticoagulants: The patient has                            taken no previous anticoagulant or antiplatelet                            agents. ASA Grade Assessment: II - A patient with                            mild systemic disease. After reviewing the risks                            and benefits, the patient was deemed in                            satisfactory condition to undergo the procedure.                           After obtaining informed consent, the endoscope was  passed under direct vision. Throughout the                            procedure, the patient's blood pressure, pulse, and                            oxygen saturations were monitored continuously. The                            Model GIF-HQ190 [id number] scope was introduced                            through the mouth, and advanced to the second part                            of duodenum. The upper GI endoscopy was                             accomplished without difficulty. The patient                            tolerated the procedure well. Scope In: Scope Out: Findings:                 Mucosal changes including ringed esophagus,                            longitudinal furrows and white plaques were found                            in the entire esophagus. Esophageal findings were                            graded using the Eosinophilic Esophagitis                            Endoscopic Reference Score (EoE-EREFS) as: Edema                            Grade 1 Present (decreased clarity or absence of                            vascular markings), Rings Grade 1 Mild (subtle                            circumferential ridges seen on esophageal                            distension), Exudates Grade 1 Mild (scattered white                            lesions involving less than 10 percent of the                            esophageal surface area), Furrows  Grade 1 Present                            (vertical lines with or without visible depth) and                            Stricture present (15 mm luminal diameter).                            Biopsies were obtained from the proximal and distal                            esophagus with cold forceps for histology of                            suspected eosinophilic esophagitis. A TTS dilator                            was passed through the scope. Dilation with an                            18-19-20 mm balloon dilator was performed to 20 mm.                            The dilation site was examined following endoscope                            reinsertion and showed moderate improvement in                            luminal narrowing. Estimated blood loss was minimal.                           The stomach was normal.                           The examined duodenum was normal. Complications:            No immediate complications. Estimated Blood Loss:      Estimated blood loss was minimal. Impression:               - Esophageal mucosal changes suspicious for                            eosinophilic esophagitis. Biopsied. Dilated.                           - Normal stomach.                           - Normal examined duodenum. Recommendation:           - Patient has a contact number available for                            emergencies. The signs and symptoms of potential  delayed complications were discussed with the                            patient. Return to normal activities tomorrow.                            Written discharge instructions were provided to the                            patient.                           - Resume previous diet.                           - Continue present medications.                           - Await pathology results.                           - No aspirin, ibuprofen, naproxen, or other                            non-steroidal anti-inflammatory drugs.                           - Repeat upper endoscopy for surveillance based on                            pathology results.                           -Omeprazole 40mg  daily, 30 minutes before breakfast                           - Return to GI clinic PRN. Mauri Pole, MD 01/27/2016 1:51:27 PM This report has been signed electronically.

## 2016-01-28 ENCOUNTER — Telehealth: Payer: Self-pay | Admitting: *Deleted

## 2016-01-28 NOTE — Telephone Encounter (Signed)
  Follow up Call-  Call back number 01/27/2016  Post procedure Call Back phone  # [phone]  Permission to leave phone message Yes  Some recent data might be hidden     Patient questions:  Do you have a fever, pain , or abdominal swelling? No. Pain Score  0 *  Have you tolerated food without any problems? Yes.    Have you been able to return to your normal activities? Yes.    Do you have any questions about your discharge instructions: Diet   No. Medications  No. Follow up visit  No.  Do you have questions or concerns about your Care? No.  Actions: * If pain score is 4 or above: No action needed, pain <4.  Patient c/o "soreness to upper stomach meets rib cage area". Patient denies any vomiting, or coughing up blood,pain or any other problems. Encouraged patient to call us back if symptoms don't improve. Pt understands.

## 2016-02-15 ENCOUNTER — Other Ambulatory Visit: Payer: Self-pay

## 2016-02-15 ENCOUNTER — Telehealth: Payer: Self-pay | Admitting: Gastroenterology

## 2016-02-15 MED ORDER — OMEPRAZOLE 40 MG PO CPDR: 40.0000 mg | DELAYED_RELEASE_CAPSULE | Freq: Two times a day (BID) | ORAL | 3 refills | Status: DC

## 2016-02-15 MED ORDER — FLUTICASONE PROPIONATE HFA 220 MCG/ACT IN AERO: INHALATION_SPRAY | RESPIRATORY_TRACT | 3 refills | Status: DC

## 2016-02-15 NOTE — Telephone Encounter (Signed)
Called pharmacy back, They wanted verification that the omeprazole script was increased to 40 mg twice a day, Called Beth to verify and she said from Path report Omeprazole was increased to 40 mg twice a day

## 2016-04-09 ENCOUNTER — Encounter: Payer: Self-pay | Admitting: Emergency Medicine

## 2016-04-09 ENCOUNTER — Emergency Department
Admission: EM | Admit: 2016-04-09 | Discharge: 2016-04-10 | Disposition: A | Discharge status: Home / Self Care | Payer: 59 | Attending: Student in an Organized Health Care Education/Training Program | Admitting: Student in an Organized Health Care Education/Training Program

## 2016-04-09 DIAGNOSIS — Z79899 Other long term (current) drug therapy: Secondary | ICD-10-CM | POA: Insufficient documentation

## 2016-04-09 DIAGNOSIS — M25511 Pain in right shoulder: Secondary | ICD-10-CM | POA: Insufficient documentation

## 2016-04-09 MED ORDER — KETOROLAC TROMETHAMINE 60 MG/2ML IM SOLN
60.0000 mg | Freq: Once | INTRAMUSCULAR | Status: AC
  Administered 2016-04-09: 60 mg via INTRAMUSCULAR
  Filled 2016-04-09: qty 2

## 2016-04-09 NOTE — ED Triage Notes (Signed)
Pt presents to ED c/o R shoulder pain. Has had similar 3-4 weeks ago and was successfully treated with steroids. States pain has returned and is worse today. Joint is tender to palpation, no injury that pt is aware of. 10/10 pain.

## 2016-04-09 NOTE — ED Provider Notes (Signed)
[facility] Emergency Department Provider Note  ____________________________________________  Time seen: Approximately 10:55 PM  I have reviewed the triage vital signs and the nursing notes.   HISTORY  Chief Complaint Shoulder Pain    HPI Carlos Nolan is a 50 y.o. male that presents with right shoulder pain that worsened today. Patient states he was having similar pain 1 month ago and went to urgent care. He was given steroids and felt significantly better. Patient states that he is unable to move his arm in any direction and pain as a 10 out of 10. Patient denies trauma. She denies any swelling over her shoulder. Patient denies any sensation changes in fingers. Patient works in an office at a computer.   Past Medical History:  Diagnosis Date  . Atrial fibrillation [facility])     Patient Active Problem List   Diagnosis Date Noted  . Food impaction of esophagus     Past Surgical History:  Procedure Laterality Date  . APPENDECTOMY    . ELBOW FRACTURE SURGERY    . ESOPHAGOGASTRODUODENOSCOPY N/A 01/11/2016   Procedure: ESOPHAGOGASTRODUODENOSCOPY (EGD);  Surgeon: Mauri Pole, MD;  Location: [facility];  Service: Endoscopy;  Laterality: N/A;  . FEMUR FRACTURE SURGERY      Prior to Admission medications   Medication Sig Start Date End Date Taking? Authorizing Provider  dofetilide (TIKOSYN) 500 MCG capsule Take 500 mcg by mouth 2 (two) times daily. 01/10/16   Historical Provider, MD  fluticasone (FLOVENT HFA) 220 MCG/ACT inhaler Use 2 puffs twice daily . Do not breathe in until the medicine is swallowed 02/15/16   Mauri Pole, MD  lidocaine (XYLOCAINE) 2 % solution Use as directed 20 mLs in the mouth or throat every 4 (four) hours as needed for mouth pain. 01/27/16   Mauri Pole, MD  magnesium gluconate (MAGONATE) 500 MG tablet Take 500 mg by mouth 2 (two) times daily.    Historical Provider, MD  omeprazole (PRILOSEC) 40 MG capsule Take 1  capsule (40 mg total) by mouth 2 (two) times daily. 02/15/16   Mauri Pole, MD    Allergies Penicillins and Quinidine  History reviewed. No pertinent family history.  Social History Social History  Substance Use Topics  . Smoking status: Never Smoker  . Smokeless tobacco: Never Used  . Alcohol use Yes     Comment: occasionally     Review of Systems  Constitutional: No fever/chills ENT: No upper respiratory complaints. Cardiovascular: no chest pain. Respiratory: no cough. No SOB. Gastrointestinal: No abdominal pain.  No nausea, no vomiting.   Musculoskeletal: Positive for right shoulder pain. Skin: Negative for rash, abrasions, lacerations, ecchymosis.   ____________________________________________   PHYSICAL EXAM:  VITAL SIGNS: ED Triage Vitals  Enc Vitals Group     BP 04/09/16 2229 (!) 158/87     Pulse Rate 04/09/16 2229 82     Resp 04/09/16 2229 20     Temp 04/09/16 2229 99.3 F (37.4 C)     Temp Source 04/09/16 2229 Oral     SpO2 04/09/16 2229 98 %     Weight 04/09/16 2231 240 lb (108.9 kg)     Height 04/09/16 2231 6\' 1"  (1.854 m)     Head Circumference --      Peak Flow --      Pain Score 04/09/16 2230 10     Pain Loc --      Pain Edu? --      Excl. in [place]? --  Constitutional: Alert and oriented. Well appearing and in no acute distress. Eyes: Conjunctivae are normal. PERRL. EOMI. Head: Atraumatic. ENT:      Ears:       Nose: No congestion/rhinnorhea.      Mouth/Throat: Mucous membranes are moist.  Neck: No stridor.  Cardiovascular: Normal rate, regular rhythm. Good peripheral circulation. 2+ radial pulses bilaterally. Respiratory: Normal respiratory effort without tachypnea or retractions. Lungs CTAB.  Musculoskeletal: Limited ROM of right shoulder. Patient unable to actively abduct, extend, or flex shoulder. Able to passively abduct, extend, and flex shoulder. Strength limited due to pain. Tenderness to palpation over deltoid and over  biceps tendon. Full range of motion of neck. No tenderness over the cervical vertebrae. Neurologic:  Normal speech and language. No gross focal neurologic deficits are appreciated.  Skin:  Skin is warm, dry and intact. No rash noted. Psychiatric: Mood and affect are normal. Speech and behavior are normal. Patient exhibits appropriate insight and judgement.   ____________________________________________   LABS (all labs ordered are listed, but only abnormal results are displayed)  Labs Reviewed - No data to display ____________________________________________  EKG   ____________________________________________  RADIOLOGY  ____________________________________________    PROCEDURES  Procedure(s) performed:    Procedures    Medications  ketorolac (TORADOL) injection 60 mg (60 mg Intramuscular Given 04/09/16 2249)     ____________________________________________   INITIAL IMPRESSION / ASSESSMENT AND PLAN / ED COURSE  Pertinent labs & imaging results that were available during my care of the patient were reviewed by me and considered in my medical decision making (see chart for details).  Review of the [place] CSRS was performed in accordance of the [place] prior to dispensing any controlled drugs.  Clinical Course     My assessment is that patient likely has a rotator cuff tear. Patient has significant pain with range of motion. Patient also could have impingement, tendinitis, bursitis. Patient was referred to orthopedics for evaluation. Patient will be discharged home with prescriptions for tramadol. Patient was advised to use ice and ibuprofen as needed. Patient was given sling for shoulder support. Patient is given ED precautions to return to the ED for any worsening or new symptoms.     ____________________________________________  FINAL CLINICAL IMPRESSION(S) / ED DIAGNOSES  Final diagnoses:  None      NEW MEDICATIONS STARTED DURING THIS VISIT:  New  Prescriptions   No medications on file        This chart was dictated using voice recognition software/Dragon. Despite best efforts to proofread, errors can occur which can change the meaning. Any change was purely unintentional.   Laban Emperor, PA-C 04/10/16 0013    Merlyn Lot, MD 04/10/16 [phone]

## 2016-04-10 MED ORDER — TRAMADOL HCL 50 MG PO TABS: 50.0000 mg | ORAL_TABLET | Freq: Four times a day (QID) | ORAL | 0 refills | Status: DC | PRN

## 2016-04-10 NOTE — Discharge Instructions (Signed)
Take ibuprofen as needed for pain.

## 2016-04-15 ENCOUNTER — Other Ambulatory Visit: Payer: Self-pay | Admitting: Family Medicine

## 2016-04-15 DIAGNOSIS — M778 Other enthesopathies, not elsewhere classified: Secondary | ICD-10-CM

## 2016-04-15 DIAGNOSIS — M7581 Other shoulder lesions, right shoulder: Principal | ICD-10-CM

## 2016-04-26 ENCOUNTER — Ambulatory Visit
Admission: RE | Admit: 2016-04-26 | Discharge: 2016-04-26 | Disposition: A | Discharge status: Home / Self Care | Payer: 59 | Source: Ambulatory Visit | Attending: Family Medicine | Admitting: Family Medicine

## 2016-04-26 DIAGNOSIS — M7581 Other shoulder lesions, right shoulder: Principal | ICD-10-CM

## 2016-04-26 DIAGNOSIS — M778 Other enthesopathies, not elsewhere classified: Secondary | ICD-10-CM

## 2016-05-05 ENCOUNTER — Encounter: Payer: 59 | Admitting: Gastroenterology

## 2016-05-06 ENCOUNTER — Ambulatory Visit
Admission: RE | Admit: 2016-05-06 | Discharge: 2016-05-06 | Disposition: A | Discharge status: Home / Self Care | Payer: 59 | Source: Ambulatory Visit | Attending: Family Medicine | Admitting: Family Medicine

## 2016-05-06 DIAGNOSIS — M7551 Bursitis of right shoulder: Secondary | ICD-10-CM | POA: Insufficient documentation

## 2016-05-06 DIAGNOSIS — M7581 Other shoulder lesions, right shoulder: Secondary | ICD-10-CM | POA: Insufficient documentation

## 2016-05-06 DIAGNOSIS — M75101 Unspecified rotator cuff tear or rupture of right shoulder, not specified as traumatic: Secondary | ICD-10-CM | POA: Diagnosis not present

## 2016-06-07 ENCOUNTER — Encounter: Payer: Self-pay | Admitting: *Deleted

## 2016-06-15 ENCOUNTER — Ambulatory Visit: Payer: 59 | Admitting: Anesthesiology

## 2016-06-15 ENCOUNTER — Ambulatory Visit
Admission: RE | Admit: 2016-06-15 | Discharge: 2016-06-15 | Disposition: A | Discharge status: Home / Self Care | Payer: 59 | Source: Ambulatory Visit | Attending: Surgery | Admitting: Surgery

## 2016-06-15 ENCOUNTER — Encounter
Admission: RE | Disposition: A | Discharge status: Home / Self Care | Payer: Self-pay | Source: Ambulatory Visit | Attending: Surgery

## 2016-06-15 DIAGNOSIS — S43431A Superior glenoid labrum lesion of right shoulder, initial encounter: Secondary | ICD-10-CM | POA: Insufficient documentation

## 2016-06-15 DIAGNOSIS — Z79899 Other long term (current) drug therapy: Secondary | ICD-10-CM | POA: Diagnosis not present

## 2016-06-15 DIAGNOSIS — M7581 Other shoulder lesions, right shoulder: Secondary | ICD-10-CM | POA: Insufficient documentation

## 2016-06-15 DIAGNOSIS — M7541 Impingement syndrome of right shoulder: Secondary | ICD-10-CM | POA: Insufficient documentation

## 2016-06-15 DIAGNOSIS — Z791 Long term (current) use of non-steroidal anti-inflammatories (NSAID): Secondary | ICD-10-CM | POA: Insufficient documentation

## 2016-06-15 DIAGNOSIS — M75111 Incomplete rotator cuff tear or rupture of right shoulder, not specified as traumatic: Secondary | ICD-10-CM | POA: Diagnosis present

## 2016-06-15 DIAGNOSIS — X58XXXA Exposure to other specified factors, initial encounter: Secondary | ICD-10-CM | POA: Diagnosis not present

## 2016-06-15 HISTORY — PX: SHOULDER ARTHROSCOPY: SHX128

## 2016-06-15 SURGERY — ARTHROSCOPY, SHOULDER
Anesthesia: Monitor Anesthesia Care | Site: Shoulder | Laterality: Right | Wound class: Clean

## 2016-06-15 MED ORDER — FENTANYL CITRATE (PF) 100 MCG/2ML IJ SOLN
INTRAMUSCULAR | Status: DC | PRN
  Administered 2016-06-15: 100 ug via INTRAVENOUS

## 2016-06-15 MED ORDER — MIDAZOLAM HCL 2 MG/2ML IJ SOLN
INTRAMUSCULAR | Status: DC | PRN
  Administered 2016-06-15: 2 mg via INTRAVENOUS

## 2016-06-15 MED ORDER — DEXTROSE 5 % IV SOLN: 900.0000 mg | Freq: Once | INTRAVENOUS | Status: DC

## 2016-06-15 MED ORDER — BUPIVACAINE-EPINEPHRINE 0.5% -1:200000 IJ SOLN
INTRAMUSCULAR | Status: DC | PRN
  Administered 2016-06-15: 30 mL

## 2016-06-15 MED ORDER — LIDOCAINE HCL (CARDIAC) 20 MG/ML IV SOLN
INTRAVENOUS | Status: DC | PRN
  Administered 2016-06-15: 40 mg via INTRAVENOUS

## 2016-06-15 MED ORDER — SODIUM CHLORIDE 0.9 % IV SOLN
900.0000 mg | Freq: Once | INTRAVENOUS | Status: AC
  Administered 2016-06-15: 900 mg via INTRAVENOUS

## 2016-06-15 MED ORDER — LACTATED RINGERS IV SOLN
INTRAVENOUS | Status: DC
  Administered 2016-06-15: 12:00:00 via INTRAVENOUS

## 2016-06-15 MED ORDER — ROPIVACAINE HCL 5 MG/ML IJ SOLN
INTRAMUSCULAR | Status: DC | PRN
  Administered 2016-06-15: 35 mL via PERINEURAL

## 2016-06-15 MED ORDER — PROPOFOL 500 MG/50ML IV EMUL
INTRAVENOUS | Status: DC | PRN
  Administered 2016-06-15: 100 ug/kg/min via INTRAVENOUS

## 2016-06-15 MED ORDER — OXYCODONE HCL 5 MG PO TABS: 5.0000 mg | ORAL_TABLET | ORAL | 0 refills | Status: DC | PRN

## 2016-06-15 SURGICAL SUPPLY — 36 items
BIT DRILL JUGRKNT W/NDL BIT2.9 (DRILL) IMPLANT
BLADE FULL RADIUS 3.5 (BLADE) ×3 IMPLANT
BUR ACROMIONIZER 4.0 (BURR) ×3 IMPLANT
CANNULA SHAVER 8MMX76MM (CANNULA) ×3 IMPLANT
CHLORAPREP W/TINT 26ML (MISCELLANEOUS) ×6 IMPLANT
COVER LIGHT HANDLE UNIVERSAL (MISCELLANEOUS) ×6 IMPLANT
COVER MAYO STAND STRL (DRAPES) ×3 IMPLANT
DRAPE IMP U-DRAPE 54X76 (DRAPES) ×6 IMPLANT
DRILL JUGGERKNOT W/NDL BIT 2.9 (DRILL)
GAUZE PETRO XEROFOAM 1X8 (MISCELLANEOUS) ×3 IMPLANT
GAUZE SPONGE 4X4 12PLY STRL (GAUZE/BANDAGES/DRESSINGS) ×3 IMPLANT
GLOVE BIO SURGEON STRL SZ8 (GLOVE) ×6 IMPLANT
GLOVE INDICATOR 8.0 STRL GRN (GLOVE) ×3 IMPLANT
GOWN STRL REUS W/ TWL LRG LVL3 (GOWN DISPOSABLE) ×1 IMPLANT
GOWN STRL REUS W/ TWL XL LVL3 (GOWN DISPOSABLE) ×1 IMPLANT
GOWN STRL REUS W/TWL LRG LVL3 (GOWN DISPOSABLE) ×2
GOWN STRL REUS W/TWL XL LVL3 (GOWN DISPOSABLE) ×2
IV LACTATED RINGER IRRG 3000ML (IV SOLUTION) ×4
IV LR IRRIG 3000ML ARTHROMATIC (IV SOLUTION) ×2 IMPLANT
MANIFOLD 4PT FOR NEPTUNE1 (MISCELLANEOUS) ×3 IMPLANT
MAT BLUE FLOOR 46X72 FLO (MISCELLANEOUS) ×3 IMPLANT
NEEDLE HYPO 21X1.5 SAFETY (NEEDLE) ×3 IMPLANT
NEEDLE REVERSE CUT 1/2 CRC (NEEDLE) ×3 IMPLANT
PACK ARTHROSCOPY SHOULDER (MISCELLANEOUS) ×3 IMPLANT
PAD GROUND ADULT SPLIT (MISCELLANEOUS) IMPLANT
SLING ARM LRG DEEP (SOFTGOODS) ×3 IMPLANT
STAPLER SKIN PROX 35W (STAPLE) ×3 IMPLANT
STRAP BODY AND KNEE 60X3 (MISCELLANEOUS) ×6 IMPLANT
SUT ETHIBOND 0 MO6 C/R (SUTURE) ×3 IMPLANT
SUT VIC AB 2-0 CT1 27 (SUTURE) ×2
SUT VIC AB 2-0 CT1 TAPERPNT 27 (SUTURE) ×1 IMPLANT
TAPE MICROFOAM 4IN (TAPE) ×3 IMPLANT
TUBING ARTHRO INFLOW-ONLY STRL (TUBING) ×3 IMPLANT
TUBING CONNECTING 10 (TUBING) ×2 IMPLANT
TUBING CONNECTING 10' (TUBING) ×1
WAND HAND CNTRL MULTIVAC 90 (MISCELLANEOUS) ×3 IMPLANT

## 2016-06-15 NOTE — Anesthesia Procedure Notes (Signed)
Anesthesia Regional Block:  Interscalene brachial plexus block  Pre-Anesthetic Checklist: ,, timeout performed, Correct Patient, Correct Site, Correct Laterality, Correct Procedure, Correct Position, site marked, Risks and benefits discussed,  Surgical consent,  Pre-op evaluation,  At surgeon's request and post-op pain management  Laterality: Right  Prep: chloraprep       Needles:  Injection technique: Single-shot  Needle Type: Stimiplex     Needle Length: 10cm 10 cm Needle Gauge: 21 and 21 G    Additional Needles:  Procedures: ultrasound guided (picture in chart) Interscalene brachial plexus block Narrative:  Start time: 06/15/2016 12:26 PM End time: 06/15/2016 12:32 PM Injection made incrementally with aspirations every 5 mL.  Performed by: Personally  Anesthesiologist: Ronelle Nigh  Additional Notes: Functioning IV was confirmed and monitors applied. Ultrasound guidance: relevant anatomy identified, needle position confirmed, local anesthetic spread visualized around nerve(s)., vascular puncture avoided.  Image printed for medical record.  Negative aspiration and no paresthesias; incremental administration of local anesthetic. The patient tolerated the procedure well. Vitals signes recorded in RN notes.

## 2016-06-15 NOTE — Anesthesia Preprocedure Evaluation (Signed)
Anesthesia Evaluation  Patient identified by MRN, date of birth, ID band Patient awake    Reviewed: Allergy & Precautions, H&P , NPO status , Patient's Chart, lab work & pertinent test results  Airway Mallampati: II  TM Distance: >3 FB Neck ROM: full    Dental no notable dental hx.    Pulmonary    Pulmonary exam normal        Cardiovascular Normal cardiovascular exam     Neuro/Psych    GI/Hepatic   Endo/Other    Renal/GU      Musculoskeletal   Abdominal   Peds  Hematology   Anesthesia Other Findings   Reproductive/Obstetrics                             Anesthesia Physical Anesthesia Plan  ASA: II  Anesthesia Plan: MAC and Regional   Post-op Pain Management:    Induction:   Airway Management Planned:   Additional Equipment:   Intra-op Plan:   Post-operative Plan:   Informed Consent: I have reviewed the patients History and Physical, chart, labs and discussed the procedure including the risks, benefits and alternatives for the proposed anesthesia with the patient or authorized representative who has indicated his/her understanding and acceptance.     Plan Discussed with:   Anesthesia Plan Comments:         Anesthesia Quick Evaluation

## 2016-06-15 NOTE — Anesthesia Postprocedure Evaluation (Signed)
Anesthesia Post Note  Patient: Carlos Nolan  Procedure(s) Performed: Procedure(s) (LRB): Arthroscopic labral debridement, arthroscopic subacromial decompression, and mini-open bursectomy with exploration of rotator cuff, right shoulder (Right)  Patient location during evaluation: PACU Anesthesia Type: Regional Level of consciousness: awake and alert and oriented Pain management: satisfactory to patient Vital Signs Assessment: post-procedure vital signs reviewed and stable Respiratory status: spontaneous breathing, nonlabored ventilation and respiratory function stable Cardiovascular status: blood pressure returned to baseline and stable Postop Assessment: Adequate PO intake and No signs of nausea or vomiting Anesthetic complications: no    Raliegh Ip

## 2016-06-15 NOTE — Progress Notes (Signed)
Assisted Mike Stella ANMD with right, ultrasound guided, supraclavicular block. Side rails up, monitors on throughout procedure. See vital signs in flow sheet. Tolerated Procedure well.  

## 2016-06-15 NOTE — Anesthesia Procedure Notes (Signed)
Procedure Name: MAC Date/Time: 06/15/2016 2:22 PM Performed by: Janna Arch Pre-anesthesia Checklist: Patient identified, Emergency Drugs available, Suction available and Patient being monitored Patient Re-evaluated:Patient Re-evaluated prior to induction

## 2016-06-15 NOTE — Transfer of Care (Signed)
Immediate Anesthesia Transfer of Care Note  Patient: Carlos Nolan  Procedure(s) Performed: Procedure(s): ARTHROSCOPY SHOULDER debridement and decompression with possible rotator cuff repair (Right)  Patient Location: PACU  Anesthesia Type: MAC, Regional  Level of Consciousness: awake, alert  and patient cooperative  Airway and Oxygen Therapy: Patient Spontanous Breathing and Patient connected to supplemental oxygen  Post-op Assessment: Post-op Vital signs reviewed, Patient's Cardiovascular Status Stable, Respiratory Function Stable, Patent Airway and No signs of Nausea or vomiting  Post-op Vital Signs: Reviewed and stable  Complications: No apparent anesthesia complications

## 2016-06-15 NOTE — Op Note (Signed)
06/15/2016  3:39 PM  Patient:   Carlos Nolan  Pre-Op Diagnosis:   Painful/tendinopathy with partial-thickness rotator cuff tear, right shoulder.  Postoperative diagnosis: Impingement/tendinopathy with Type I SLAP tear, right shoulder.  Procedure: Arthroscopic labral debridement, arthroscopic subacromial decompression, and mini-open bursectomy with exploration of rotator cuff, right shoulder.  Anesthesia: IV sedation with interscalene block placed preoperatively by the anesthesiologist.  Surgeon:   Pascal Lux, MD  Assistant:   Ilda Foil, PA-S  Findings: As above. The rotator cuff demonstrated some mild "blistering" on the bursal surface anterior insertional fibers of the supraspinatus but otherwise was in excellent condition, as was the biceps tendon. The labrum demonstrated moderate fraying superiorly from approximately the 10:00 position to the 1:00 position without frank detachment from the glenoid. The articular surfaces of the glenoid and humerus both were in excellent condition.  Complications: None  Fluids:   700 cc  Estimated blood loss: 5 cc  Tourniquet time: None  Drains: None  Closure: Staples   Brief clinical note: The patient is a 51 year old male with a history of right shoulder pain. The patient's symptoms have progressed despite medications, activity modification, etc. The patient's history and examination are consistent with impingement/tendinopathy with a possible rotator cuff tear. An MRI scan demonstrated severe high-grade tendinopathy with a possible bursal surface tear of the anterior insertional fibers of the supraspinatus. The patient presents at this time for definitive management of these shoulder symptoms.  Procedure: The patient underwent placement of an interscalene block by the anesthesiologist in the preoperative holding area before he was brought into the operating room and lain in the supine position. The patient  then underwent IV sedation before being repositioned in the beach chair position using the beach chair positioner. The right shoulder and upper extremity were prepped with ChloraPrep solution before being draped sterilely. Preoperative antibiotics were administered. A timeout was performed to confirm the proper surgical site before the expected portal sites and incision site were injected with 0.5% Sensorcaine with epinephrine. A posterior portal was created and the glenohumeral joint thoroughly inspected with the findings as described above. An anterior portal was created using an outside-in technique. The labrum and rotator cuff were further probed, again confirming the above-noted findings. The areas of labral fraying were debrided back to stable margins using the full-radius resector. The ArthroCare wand was inserted and used to obtain hemostasis as well as to "anneal" the labrum superiorly and anteriorly. The labrum again was probed, confirming the above-noted findings. In addition, the biceps was drawn into the joint and inspected carefully to be sure that there was no tendinopathic changes which might necessitate a biceps tenodesis. None were identified, so the instruments were removed from the joint after suctioning the excess fluid.  The camera was repositioned through the posterior portal into the subacromial space. A separate lateral portal was created using an outside-in technique. The 3.5 mm full-radius resector was introduced and used to perform a subtotal bursectomy. The ArthroCare wand was then inserted and used to remove the periosteal tissue off the undersurface of the anterior third of the acromion as well as to recess the coracoacromial ligament from its attachment along the anterior and lateral margins of the acromion. The 4.0 mm acromionizing bur was introduced and used to complete the decompression by removing the undersurface of the anterior third of the acromion. The full radius resector  was reintroduced to remove any residual bony debris before the ArthroCare wand was reintroduced to obtain hemostasis. The rotator cuff was  carefully inspected arthroscopically. There appeared to be an area of blistering on the bursal side of the anterior insertional fibers of the supraspinatus. Given the MRI findings and this finding, it was felt best to proceed with an open exploration. The instruments were then removed from the subacromial space after suctioning the excess fluid.  An approximately 4-5 cm incision was made over the anterolateral aspect of the shoulder beginning at the anterolateral corner of the acromion and extending distally in line with the bicipital groove. This incision was carried down through the subcutaneous tissues to expose the deltoid fascia. The raphae between the anterior and middle thirds was identified and this plane developed to provide access into the subacromial space. Additional bursal tissues were debrided sharply using Metzenbaum scissors. The rotator cuff was carefully inspected both visually and by palpation. A small area of blistering was again identified. Rather than ellipsing it out, it was felt best to proceed with a needle fenestration of the area to stimulate healing. This was accomplished with a 25-gauge needle.  The wound was copiously irrigated with sterile saline solution before the deltoid raphae was reapproximated using 2-0 Vicryl interrupted sutures. The subcutaneous tissues were closed in two layers using 2-0 Vicryl interrupted sutures before the skin was closed using staples. The portal sites also were closed using staples. A sterile bulky dressing was applied to the shoulder before the arm was placed into a standard shoulder sling. The patient was then awakened and returned to the recovery room in satisfactory condition after tolerating the procedure well.

## 2016-06-15 NOTE — H&P (Signed)
Paper H&P to be scanned into permanent record. H&P reviewed. No changes. 

## 2016-06-16 ENCOUNTER — Encounter: Payer: Self-pay | Admitting: Surgery

## 2019-01-08 DIAGNOSIS — U071 COVID-19: Secondary | ICD-10-CM

## 2019-01-08 HISTORY — DX: COVID-19: U07.1

## 2019-01-30 ENCOUNTER — Other Ambulatory Visit: Payer: Self-pay | Admitting: *Deleted

## 2019-01-30 DIAGNOSIS — Z20822 Contact with and (suspected) exposure to covid-19: Secondary | ICD-10-CM

## 2019-01-31 LAB — NOVEL CORONAVIRUS, NAA: SARS-CoV-2, NAA: DETECTED — AB

## 2019-08-06 ENCOUNTER — Other Ambulatory Visit: Payer: Self-pay | Admitting: Surgery

## 2019-08-15 ENCOUNTER — Encounter
Admission: RE | Admit: 2019-08-15 | Discharge: 2019-08-15 | Disposition: A | Discharge status: Home / Self Care | Payer: 59 | Source: Ambulatory Visit | Attending: Surgery | Admitting: Surgery

## 2019-08-15 ENCOUNTER — Other Ambulatory Visit: Payer: Self-pay

## 2019-08-15 NOTE — Patient Instructions (Signed)
Your procedure is scheduled on: 08/22/19 Report to [place]. To find out your arrival time please call [phone] between 1PM - 3PM on 08/21/19.  Remember: Instructions that are not followed completely may result in serious medical risk, up to and including death, or upon the discretion of your surgeon and anesthesiologist your surgery may need to be rescheduled.     _X__ 1. Do not eat food after midnight the night before your procedure.                 No gum chewing or hard candies. You may drink clear liquids up to 2 hours                 before you are scheduled to arrive for your surgery- DO not drink clear                 liquids within 2 hours of the start of your surgery.                 Clear Liquids include:  water, apple juice without pulp, clear carbohydrate                 drink such as Clearfast or Gatorade, Black Coffee or Tea (Do not add                 anything to coffee or tea). Diabetics water only  __X__2.  On the morning of surgery brush your teeth with toothpaste and water, you                 may rinse your mouth with mouthwash if you wish.  Do not swallow any              toothpaste of mouthwash.     _X__ 3.  No Alcohol for 24 hours before or after surgery.   _X__ 4.  Do Not Smoke or use e-cigarettes For 24 Hours Prior to Your Surgery.                 Do not use any chewable tobacco products for at least 6 hours prior to                 surgery.  ____  5.  Bring all medications with you on the day of surgery if instructed.   __X__  6.  Notify your doctor if there is any change in your medical condition      (cold, fever, infections).     Do not wear jewelry, make-up, hairpins, clips or nail polish. Do not wear lotions, powders, or perfumes.  Do not shave 48 hours prior to surgery. Men may shave face and neck. Do not bring valuables to the hospital.    [facility] is not responsible for any belongings or  valuables.  Contacts, dentures/partials or body piercings may not be worn into surgery. Bring a case for your contacts, glasses or hearing aids, a denture cup will be supplied. Leave your suitcase in the car. After surgery it may be brought to your room. For patients admitted to the hospital, discharge time is determined by your treatment team.   Patients discharged the day of surgery will not be allowed to drive home.   Please read over the following fact sheets that you were given:   MRSA Information  __X__ Take these medicines the morning of surgery with A SIP OF WATER:  1. NONE  2.   3.   4.  5.  6.  ____ Fleet Enema (as directed)   __X__ Use CHG Soap/SAGE wipes as directed  ____ Use inhalers on the day of surgery  ____ Stop metformin/Janumet/Farxiga 2 days prior to surgery    ____ Take 1/2 of usual insulin dose the night before surgery. No insulin the morning          of surgery.   ____ Stop Blood Thinners Coumadin/Plavix/Xarelto/Pleta/Pradaxa/Eliquis/Effient/Aspirin  on   Or contact your Surgeon, Cardiologist or Medical Doctor regarding  ability to stop your blood thinners  __X__ Stop Anti-inflammatories 7 days before surgery such as Advil, Ibuprofen, Motrin,  BC or Goodies Powder, Naprosyn, Naproxen, Aleve, Aspirin    __X__ Stop all herbal supplements, fish oil or vitamin E until after surgery.    ____ Bring C-Pap to the hospital.    ENSURE PRE SURGERY DRINK TO BE COMPLETED 2 HOURS PRIOR TO YOUR ARRIVAL THE DAY OF SURGERY  REVIEW THE INCENTIVE SPIROMETER INSTRUCTIONS

## 2019-08-20 ENCOUNTER — Encounter
Admission: RE | Admit: 2019-08-20 | Discharge: 2019-08-20 | Disposition: A | Discharge status: Home / Self Care | Payer: 59 | Source: Ambulatory Visit | Attending: Surgery | Admitting: Surgery

## 2019-08-20 ENCOUNTER — Other Ambulatory Visit: Payer: Self-pay

## 2019-08-20 DIAGNOSIS — Z01818 Encounter for other preprocedural examination: Secondary | ICD-10-CM | POA: Insufficient documentation

## 2019-08-20 DIAGNOSIS — Z20822 Contact with and (suspected) exposure to covid-19: Secondary | ICD-10-CM | POA: Diagnosis not present

## 2019-08-20 DIAGNOSIS — I4891 Unspecified atrial fibrillation: Secondary | ICD-10-CM | POA: Insufficient documentation

## 2019-08-20 LAB — CBC
HCT: 42.3 % (ref 39.0–52.0)
Hemoglobin: 15 g/dL (ref 13.0–17.0)
MCH: 31.3 pg (ref 26.0–34.0)
MCHC: 35.5 g/dL (ref 30.0–36.0)
MCV: 88.3 fL (ref 80.0–100.0)
Platelets: 198 10*3/uL (ref 150–400)
RBC: 4.79 MIL/uL (ref 4.22–5.81)
RDW: 12.1 % (ref 11.5–15.5)
WBC: 6.4 10*3/uL (ref 4.0–10.5)
nRBC: 0 % (ref 0.0–0.2)

## 2019-08-20 LAB — SARS CORONAVIRUS 2 (TAT 6-24 HRS): SARS Coronavirus 2: NEGATIVE

## 2019-08-21 MED ORDER — CLINDAMYCIN PHOSPHATE 900 MG/50ML IV SOLN
900.0000 mg | INTRAVENOUS | Status: AC
  Administered 2019-08-22: 900 mg via INTRAVENOUS

## 2019-08-22 ENCOUNTER — Ambulatory Visit: Payer: 59 | Admitting: Certified Registered"

## 2019-08-22 ENCOUNTER — Encounter: Payer: Self-pay | Admitting: Surgery

## 2019-08-22 ENCOUNTER — Encounter
Admission: RE | Disposition: A | Discharge status: Home / Self Care | Payer: Self-pay | Source: Home / Self Care | Attending: Surgery

## 2019-08-22 ENCOUNTER — Ambulatory Visit
Admission: RE | Admit: 2019-08-22 | Discharge: 2019-08-22 | Disposition: A | Discharge status: Home / Self Care | Payer: 59 | Attending: Surgery | Admitting: Surgery

## 2019-08-22 ENCOUNTER — Other Ambulatory Visit: Payer: Self-pay

## 2019-08-22 DIAGNOSIS — Z833 Family history of diabetes mellitus: Secondary | ICD-10-CM | POA: Diagnosis not present

## 2019-08-22 DIAGNOSIS — T8484XA Pain due to internal orthopedic prosthetic devices, implants and grafts, initial encounter: Secondary | ICD-10-CM | POA: Insufficient documentation

## 2019-08-22 DIAGNOSIS — Z888 Allergy status to other drugs, medicaments and biological substances status: Secondary | ICD-10-CM | POA: Diagnosis not present

## 2019-08-22 DIAGNOSIS — X58XXXA Exposure to other specified factors, initial encounter: Secondary | ICD-10-CM | POA: Diagnosis not present

## 2019-08-22 DIAGNOSIS — M25522 Pain in left elbow: Secondary | ICD-10-CM | POA: Diagnosis not present

## 2019-08-22 DIAGNOSIS — Z8249 Family history of ischemic heart disease and other diseases of the circulatory system: Secondary | ICD-10-CM | POA: Insufficient documentation

## 2019-08-22 DIAGNOSIS — Z91018 Allergy to other foods: Secondary | ICD-10-CM | POA: Insufficient documentation

## 2019-08-22 DIAGNOSIS — Z79899 Other long term (current) drug therapy: Secondary | ICD-10-CM | POA: Diagnosis not present

## 2019-08-22 DIAGNOSIS — I4891 Unspecified atrial fibrillation: Secondary | ICD-10-CM | POA: Diagnosis not present

## 2019-08-22 DIAGNOSIS — Z823 Family history of stroke: Secondary | ICD-10-CM | POA: Insufficient documentation

## 2019-08-22 DIAGNOSIS — Y939 Activity, unspecified: Secondary | ICD-10-CM | POA: Diagnosis not present

## 2019-08-22 DIAGNOSIS — Z880 Allergy status to penicillin: Secondary | ICD-10-CM | POA: Diagnosis not present

## 2019-08-22 DIAGNOSIS — F419 Anxiety disorder, unspecified: Secondary | ICD-10-CM | POA: Diagnosis not present

## 2019-08-22 HISTORY — PX: HARDWARE REMOVAL: SHX979

## 2019-08-22 SURGERY — REMOVAL, HARDWARE
Anesthesia: General | Site: Elbow | Laterality: Left

## 2019-08-22 MED ORDER — DEXAMETHASONE SODIUM PHOSPHATE 10 MG/ML IJ SOLN
INTRAMUSCULAR | Status: DC | PRN
  Administered 2019-08-22: 5 mg via INTRAVENOUS

## 2019-08-22 MED ORDER — GLYCOPYRROLATE 0.2 MG/ML IJ SOLN
INTRAMUSCULAR | Status: DC | PRN
  Administered 2019-08-22: .2 mg via INTRAVENOUS

## 2019-08-22 MED ORDER — CLINDAMYCIN PHOSPHATE 900 MG/50ML IV SOLN
INTRAVENOUS | Status: AC
  Filled 2019-08-22: qty 50

## 2019-08-22 MED ORDER — BUPIVACAINE HCL (PF) 0.5 % IJ SOLN
INTRAMUSCULAR | Status: AC
  Filled 2019-08-22: qty 30

## 2019-08-22 MED ORDER — LACTATED RINGERS IV SOLN
INTRAVENOUS | Status: DC
  Administered 2019-08-22: 125 mL/h via INTRAVENOUS

## 2019-08-22 MED ORDER — MIDAZOLAM HCL 5 MG/5ML IJ SOLN
INTRAMUSCULAR | Status: DC | PRN
  Administered 2019-08-22: 2 mg via INTRAVENOUS

## 2019-08-22 MED ORDER — HYDROCODONE-ACETAMINOPHEN 5-325 MG PO TABS: 1.0000 | ORAL_TABLET | Freq: Four times a day (QID) | ORAL | 0 refills | Status: DC | PRN

## 2019-08-22 MED ORDER — GLYCOPYRROLATE 0.2 MG/ML IJ SOLN
INTRAMUSCULAR | Status: AC
  Filled 2019-08-22: qty 1

## 2019-08-22 MED ORDER — FENTANYL CITRATE (PF) 100 MCG/2ML IJ SOLN: 25.0000 ug | INTRAMUSCULAR | Status: DC | PRN

## 2019-08-22 MED ORDER — DEXMEDETOMIDINE HCL IN NACL 200 MCG/50ML IV SOLN
INTRAVENOUS | Status: DC | PRN
  Administered 2019-08-22: 12 ug via INTRAVENOUS
  Administered 2019-08-22: 8 ug via INTRAVENOUS

## 2019-08-22 MED ORDER — DEXAMETHASONE SODIUM PHOSPHATE 10 MG/ML IJ SOLN
INTRAMUSCULAR | Status: AC
  Filled 2019-08-22: qty 1

## 2019-08-22 MED ORDER — FENTANYL CITRATE (PF) 100 MCG/2ML IJ SOLN
INTRAMUSCULAR | Status: DC | PRN
  Administered 2019-08-22: 100 ug via INTRAVENOUS

## 2019-08-22 MED ORDER — ACETAMINOPHEN 10 MG/ML IV SOLN
INTRAVENOUS | Status: DC | PRN
  Administered 2019-08-22: 1000 mg via INTRAVENOUS

## 2019-08-22 MED ORDER — FENTANYL CITRATE (PF) 100 MCG/2ML IJ SOLN
INTRAMUSCULAR | Status: AC
  Filled 2019-08-22: qty 2

## 2019-08-22 MED ORDER — MIDAZOLAM HCL 2 MG/2ML IJ SOLN
INTRAMUSCULAR | Status: AC
  Filled 2019-08-22: qty 2

## 2019-08-22 MED ORDER — ONDANSETRON HCL 4 MG/2ML IJ SOLN: 4.0000 mg | Freq: Once | INTRAMUSCULAR | Status: DC | PRN

## 2019-08-22 MED ORDER — PROPOFOL 10 MG/ML IV BOLUS
INTRAVENOUS | Status: DC | PRN
  Administered 2019-08-22: 200 mg via INTRAVENOUS

## 2019-08-22 MED ORDER — ONDANSETRON HCL 4 MG/2ML IJ SOLN
INTRAMUSCULAR | Status: AC
  Filled 2019-08-22: qty 2

## 2019-08-22 MED ORDER — BUPIVACAINE HCL (PF) 0.5 % IJ SOLN
INTRAMUSCULAR | Status: DC | PRN
  Administered 2019-08-22: 10 mL

## 2019-08-22 MED ORDER — FAMOTIDINE 20 MG PO TABS: 20.0000 mg | ORAL_TABLET | Freq: Once | ORAL | Status: AC

## 2019-08-22 MED ORDER — FAMOTIDINE 20 MG PO TABS
ORAL_TABLET | ORAL | Status: AC
  Administered 2019-08-22: 20 mg via ORAL
  Filled 2019-08-22: qty 1

## 2019-08-22 MED ORDER — KETOROLAC TROMETHAMINE 30 MG/ML IJ SOLN
INTRAMUSCULAR | Status: AC
  Filled 2019-08-22: qty 1

## 2019-08-22 MED ORDER — ACETAMINOPHEN 10 MG/ML IV SOLN
INTRAVENOUS | Status: AC
  Filled 2019-08-22: qty 100

## 2019-08-22 MED ORDER — ONDANSETRON HCL 4 MG/2ML IJ SOLN
INTRAMUSCULAR | Status: DC | PRN
  Administered 2019-08-22: 4 mg via INTRAVENOUS

## 2019-08-22 SURGICAL SUPPLY — 37 items
BLADE SURG 15 STRL LF DISP TIS (BLADE) IMPLANT
BLADE SURG 15 STRL SS (BLADE) ×3
BNDG COHESIVE 4X5 TAN STRL (GAUZE/BANDAGES/DRESSINGS) ×3 IMPLANT
BNDG ELASTIC 4X5.8 VLCR STR LF (GAUZE/BANDAGES/DRESSINGS) ×3 IMPLANT
BNDG ESMARK 4X12 TAN STRL LF (GAUZE/BANDAGES/DRESSINGS) ×3 IMPLANT
CANISTER SUCT 1200ML W/VALVE (MISCELLANEOUS) ×3 IMPLANT
CHLORAPREP W/TINT 26 (MISCELLANEOUS) ×4 IMPLANT
COVER WAND RF STERILE (DRAPES) ×3 IMPLANT
CUFF TOURN SGL QUICK 18X4 (TOURNIQUET CUFF) ×2 IMPLANT
CUFF TOURN SGL QUICK 24 (TOURNIQUET CUFF)
CUFF TRNQT CYL 24X4X16.5-23 (TOURNIQUET CUFF) IMPLANT
DRAPE FLUOR MINI C-ARM 54X84 (DRAPES) ×3 IMPLANT
DRAPE INCISE IOBAN 66X45 STRL (DRAPES) ×3 IMPLANT
DRAPE U-SHAPE 47X51 STRL (DRAPES) ×5 IMPLANT
ELECT CAUTERY BLADE 6.4 (BLADE) ×3 IMPLANT
ELECT REM PT RETURN 9FT ADLT (ELECTROSURGICAL) ×3
ELECTRODE REM PT RTRN 9FT ADLT (ELECTROSURGICAL) ×1 IMPLANT
GAUZE SPONGE 4X4 12PLY STRL (GAUZE/BANDAGES/DRESSINGS) ×3 IMPLANT
GAUZE XEROFORM 1X8 LF (GAUZE/BANDAGES/DRESSINGS) ×3 IMPLANT
GLOVE BIO SURGEON STRL SZ8 (GLOVE) ×6 IMPLANT
GLOVE INDICATOR 8.0 STRL GRN (GLOVE) ×3 IMPLANT
GOWN STRL REUS W/ TWL LRG LVL3 (GOWN DISPOSABLE) ×2 IMPLANT
GOWN STRL REUS W/ TWL XL LVL3 (GOWN DISPOSABLE) ×1 IMPLANT
GOWN STRL REUS W/TWL LRG LVL3 (GOWN DISPOSABLE) ×6
GOWN STRL REUS W/TWL XL LVL3 (GOWN DISPOSABLE) ×3
KIT TURNOVER KIT A (KITS) ×3 IMPLANT
NDL FILTER BLUNT 18X1 1/2 (NEEDLE) ×1 IMPLANT
NEEDLE FILTER BLUNT 18X 1/2SAF (NEEDLE) ×2
NEEDLE FILTER BLUNT 18X1 1/2 (NEEDLE) ×1 IMPLANT
NS IRRIG 1000ML POUR BTL (IV SOLUTION) ×3 IMPLANT
PACK EXTREMITY (MISCELLANEOUS) ×3 IMPLANT
STAPLER SKIN PROX 35W (STAPLE) ×5 IMPLANT
STOCKINETTE M/LG 89821 (MISCELLANEOUS) ×3 IMPLANT
SUT PROLENE 4 0 PS 2 18 (SUTURE) ×6 IMPLANT
SUT VIC AB 2-0 SH 27 (SUTURE) ×6
SUT VIC AB 2-0 SH 27XBRD (SUTURE) ×2 IMPLANT
SYR 10ML LL (SYRINGE) ×3 IMPLANT

## 2019-08-22 NOTE — Discharge Instructions (Addendum)
Orthopedic discharge instructions: Keep dressing dry and intact. Keep hand elevated above heart level. May shower after dressing removed on postop day 4 (Monday). Cover staples with Band-Aids after drying off. Apply ice to affected area frequently. Take ibuprofen 600-800 mg TID with meals for 7-10 days, then as necessary.    TID = three times per day (every 8 hours) Take ES Tylenol or pain medication as prescribed when needed.  Return for follow-up in 10-14 days or as scheduled.    AMBULATORY SURGERY  DISCHARGE INSTRUCTIONS   1) The drugs that you were given will stay in your system until tomorrow so for the next 24 hours you should not:  A) Drive an automobile B) Make any legal decisions C) Drink any alcoholic beverage   2) You may resume regular meals tomorrow.  Today it is better to start with liquids and gradually work up to solid foods.  You may eat anything you prefer, but it is better to start with liquids, then soup and crackers, and gradually work up to solid foods.   3) Please notify your doctor immediately if you have any unusual bleeding, trouble breathing, redness and pain at the surgery site, drainage, fever, or pain not relieved by medication.    4) Additional Instructions:        Please contact your physician with any problems or Same Day Surgery at [phone], Monday through Friday 6 am to 4 pm, or [place] [facility] number at [phone].

## 2019-08-22 NOTE — Anesthesia Preprocedure Evaluation (Addendum)
Anesthesia Evaluation  Patient identified by MRN, date of birth, ID band Patient awake    Reviewed: Allergy & Precautions, NPO status , Patient's Chart, lab work & pertinent test results  History of Anesthesia Complications Negative for: history of anesthetic complications  Airway Mallampati: III       Dental   Pulmonary neg sleep apnea, neg COPD, Not current smoker,           Cardiovascular (-) hypertension(-) Past MI and (-) CHF + dysrhythmias (s/p ablation) Atrial Fibrillation (-) Valvular Problems/Murmurs     Neuro/Psych neg Seizures    GI/Hepatic Neg liver ROS, neg GERD  ,  Endo/Other  neg diabetes  Renal/GU negative Renal ROS     Musculoskeletal   Abdominal   Peds  Hematology   Anesthesia Other Findings   Reproductive/Obstetrics                             Anesthesia Physical Anesthesia Plan  ASA: II  Anesthesia Plan: General   Post-op Pain Management:    Induction: Intravenous  PONV Risk Score and Plan: 2 and Ondansetron and Dexamethasone  Airway Management Planned: LMA  Additional Equipment:   Intra-op Plan:   Post-operative Plan:   Informed Consent: I have reviewed the patients History and Physical, chart, labs and discussed the procedure including the risks, benefits and alternatives for the proposed anesthesia with the patient or authorized representative who has indicated his/her understanding and acceptance.       Plan Discussed with:   Anesthesia Plan Comments:         Anesthesia Quick Evaluation

## 2019-08-22 NOTE — Op Note (Signed)
08/22/2019  2:36 PM  Patient:   Carlos Nolan  Pre-Op Diagnosis:   Painful retained surgical hardware, left elbow.  Post-Op Diagnosis:   Same  Procedure:   Hardware removal, left elbow.  Surgeon:   Pascal Lux, MD  Assistant:   None  Anesthesia:   General LMA  Findings:   As above.  Complications:   None  Fluids:   500 cc crystalloid  EBL:   0 cc  UOP:   None  TT:   30 minutes at 250 mmHg  Drains:   None  Closure:   Staples  Brief Clinical Note:   The patient is a 54 year old male with a several month history of progressively worsening posterior left elbow pain.  The patient is now 36 years status post an open reduction and internal fixation of a displaced olecranon fracture using K wires and tension band wire technique.  X-rays of the left elbow demonstrate that the fracture is well-healed, but suggest that the K wires are beginning to back out, causing the patient's symptoms.  The patient presents at this time for removal of the tension band wire and K wires from his left elbow.   Procedure:   The patient was brought into the operating room and lain in the supine position.  After adequate general laryngeal mask anesthesia was obtained, the patient's left upper extremity was prepped with ChloraPrep solution before being draped sterilely.  Preoperative antibiotics were administered.  A timeout was performed to verify the appropriate surgical site before the limb was exsanguinated with an Esmarch and the tourniquet inflated to 250 mmHg.  The proximal 3/4 of the previous posterior midline incision was reopened and carried down through the subcutaneous tissues to expose the posterior aspect of the proximal ulna.  The hardware was readily identified.  Each of the 2 K wires was removed easily before the tension band wire was cut in two places and removed in two pieces.  The wound was copiously irrigated with sterile saline solution before the subcutaneous tissues were closed  in two layers using 2-0 Vicryl interrupted sutures.  The skin was closed using staples before a total of 10 cc of 0.5% plain Sensorcaine was injected in and around the incision.  A sterile bulky dressing was applied to the elbow before the patient was awakened, extubated, and returned to the recovery room in satisfactory condition after tolerating the procedure well.

## 2019-08-22 NOTE — Anesthesia Postprocedure Evaluation (Signed)
Anesthesia Post Note  Patient: Carlos Nolan  Procedure(s) Performed: HARDWARE REMOVAL FROM LEFT ELBOW (Left Elbow)  Patient location during evaluation: PACU Anesthesia Type: General Level of consciousness: awake and alert Pain management: pain level controlled Vital Signs Assessment: post-procedure vital signs reviewed and stable Respiratory status: spontaneous breathing and respiratory function stable Cardiovascular status: stable Anesthetic complications: no     Last Vitals:  Vitals:   08/22/19 1451 08/22/19 1506  BP: 112/84 112/87  Pulse: 72 81  Resp: 10 16  Temp:    SpO2: 97% 94%    Last Pain:  Vitals:   08/22/19 1506  TempSrc:   PainSc: 0-No pain                 KEPHART,WILLIAM K

## 2019-08-22 NOTE — Transfer of Care (Signed)
Immediate Anesthesia Transfer of Care Note  Patient: Carlos Nolan  Procedure(s) Performed: HARDWARE REMOVAL FROM LEFT ELBOW (Left Elbow)  Patient Location: PACU  Anesthesia Type:General  Level of Consciousness: awake  Airway & Oxygen Therapy: Patient Spontanous Breathing and Patient connected to face mask oxygen  Post-op Assessment: Report given to RN and Post -op Vital signs reviewed and stable  Post vital signs: Reviewed  Last Vitals:  Vitals Value Taken Time  BP 90/61 08/22/19 1436  Temp    Pulse 86 08/22/19 1437  Resp 14 08/22/19 1437  SpO2 97 % 08/22/19 1437  Vitals shown include unvalidated device data.  Last Pain:  Vitals:   08/22/19 1150  TempSrc: Tympanic  PainSc: 0-No pain         Complications: No apparent anesthesia complications

## 2019-08-22 NOTE — Anesthesia Procedure Notes (Signed)
Procedure Name: LMA Insertion Performed by: Evaan Tidwell, CRNA Pre-anesthesia Checklist: Patient identified, Patient being monitored, Timeout performed, Emergency Drugs available and Suction available Patient Re-evaluated:Patient Re-evaluated prior to induction Oxygen Delivery Method: Circle system utilized Preoxygenation: Pre-oxygenation with 100% oxygen Induction Type: IV induction Ventilation: Mask ventilation without difficulty LMA: LMA inserted LMA Size: 4.5 Tube type: Oral Number of attempts: 1 Placement Confirmation: positive ETCO2 and breath sounds checked- equal and bilateral Tube secured with: Tape Dental Injury: Teeth and Oropharynx as per pre-operative assessment        

## 2019-08-22 NOTE — H&P (Signed)
Paper H&P to be scanned into permanent record. H&P reviewed and patient re-examined. No changes. 

## 2019-12-24 ENCOUNTER — Emergency Department
Admission: EM | Admit: 2019-12-24 | Discharge: 2019-12-24 | Disposition: A | Discharge status: Home / Self Care | Payer: 59 | Source: Home / Self Care | Attending: Emergency Medicine | Admitting: Emergency Medicine

## 2019-12-24 ENCOUNTER — Other Ambulatory Visit: Payer: Self-pay

## 2019-12-24 ENCOUNTER — Emergency Department: Payer: 59

## 2019-12-24 DIAGNOSIS — R202 Paresthesia of skin: Secondary | ICD-10-CM | POA: Insufficient documentation

## 2019-12-24 DIAGNOSIS — R42 Dizziness and giddiness: Secondary | ICD-10-CM | POA: Insufficient documentation

## 2019-12-24 LAB — URINALYSIS, COMPLETE (UACMP) WITH MICROSCOPIC
Bacteria, UA: NONE SEEN
Bilirubin Urine: NEGATIVE
Glucose, UA: NEGATIVE mg/dL
Hgb urine dipstick: NEGATIVE
Ketones, ur: NEGATIVE mg/dL
Leukocytes,Ua: NEGATIVE
Nitrite: NEGATIVE
Protein, ur: NEGATIVE mg/dL
Specific Gravity, Urine: 1.019 (ref 1.005–1.030)
Squamous Epithelial / HPF: NONE SEEN (ref 0–5)
pH: 6 (ref 5.0–8.0)

## 2019-12-24 LAB — BASIC METABOLIC PANEL
Anion gap: 8 (ref 5–15)
BUN: 20 mg/dL (ref 6–20)
CO2: 26 mmol/L (ref 22–32)
Calcium: 8.9 mg/dL (ref 8.9–10.3)
Chloride: 105 mmol/L (ref 98–111)
Creatinine, Ser: 0.93 mg/dL (ref 0.61–1.24)
GFR calc Af Amer: >60 mL/min (ref >60)
GFR calc non Af Amer: >60 mL/min (ref >60)
Glucose, Bld: 105 mg/dL — ABNORMAL HIGH (ref 70–99)
Potassium: 3.9 mmol/L (ref 3.5–5.1)
Sodium: 139 mmol/L (ref 135–145)

## 2019-12-24 LAB — CBC
HCT: 41.8 % (ref 39.0–52.0)
Hemoglobin: 15.2 g/dL (ref 13.0–17.0)
MCH: 31.5 pg (ref 26.0–34.0)
MCHC: 36.4 g/dL — ABNORMAL HIGH (ref 30.0–36.0)
MCV: 86.5 fL (ref 80.0–100.0)
Platelets: 186 10*3/uL (ref 150–400)
RBC: 4.83 MIL/uL (ref 4.22–5.81)
RDW: 11.9 % (ref 11.5–15.5)
WBC: 7.6 10*3/uL (ref 4.0–10.5)
nRBC: 0 % (ref 0.0–0.2)

## 2019-12-24 LAB — TROPONIN I (HIGH SENSITIVITY): Troponin I (High Sensitivity): 4 ng/L (ref <18)

## 2019-12-24 MED ORDER — DIAZEPAM 5 MG PO TABS
5.0000 mg | ORAL_TABLET | Freq: Once | ORAL | Status: AC
  Administered 2019-12-24: 5 mg via ORAL
  Filled 2019-12-24: qty 1

## 2019-12-24 MED ORDER — HYDROCHLOROTHIAZIDE 25 MG PO TABS: 25.0000 mg | ORAL_TABLET | Freq: Every day | ORAL | 2 refills | Status: DC

## 2019-12-24 MED ORDER — BUTALBITAL-APAP-CAFFEINE 50-325-40 MG PO TABS
2.0000 | ORAL_TABLET | Freq: Once | ORAL | Status: AC
  Administered 2019-12-24: 2 via ORAL
  Filled 2019-12-24: qty 2

## 2019-12-24 MED ORDER — BUTALBITAL-APAP-CAFFEINE 50-325-40 MG PO TABS: 1.0000 | ORAL_TABLET | Freq: Four times a day (QID) | ORAL | 0 refills | Status: AC | PRN

## 2019-12-24 NOTE — ED Provider Notes (Addendum)
[facility] Emergency Department Provider Note  Time seen: 8:50 PM  I have reviewed the triage vital signs and the nursing notes.   HISTORY  Chief Complaint Dizziness   HPI Carlos Nolan is a 54 y.o. male with a past medical history of migraines, presents emergency department for intermittent dizziness and tingling of his left leg.  According to the patient over the past 2 days or so he has been feeling intermittent episodes of dizziness/lightheadedness.  States they mostly occur upon standing.  He states also yesterday at times he was feeling some mild tingling in his left leg.  Patient states he is also been experiencing a migraine for the past 2 days but is not atypical for him.  Patient states he has in the past experience migraines that have caused some tingling.  Currently the patient appears well.  Denies any recent illnesses fever cough congestion shortness of breath nausea vomiting diarrhea.  Overall the patient appears quite well.  Past Medical History:  Diagnosis Date  . Atrial fibrillation [facility])     Patient Active Problem List   Diagnosis Date Noted  . Food impaction of esophagus     Past Surgical History:  Procedure Laterality Date  . APPENDECTOMY    . CARDIAC ELECTROPHYSIOLOGY STUDY AND ABLATION  04/24/2015  . ELBOW FRACTURE SURGERY    . ESOPHAGOGASTRODUODENOSCOPY N/A 01/11/2016   Procedure: ESOPHAGOGASTRODUODENOSCOPY (EGD);  Surgeon: Mauri Pole, MD;  Location: [facility];  Service: Endoscopy;  Laterality: N/A;  . FEMUR FRACTURE SURGERY    . HARDWARE REMOVAL Left 08/22/2019   Procedure: HARDWARE REMOVAL FROM LEFT ELBOW;  Surgeon: Corky Mull, MD;  Location: [facility] ORS;  Service: Orthopedics;  Laterality: Left;  . SHOULDER ARTHROSCOPY Right 06/15/2016   Procedure: Arthroscopic labral debridement, arthroscopic subacromial decompression, and mini-open bursectomy with exploration of rotator cuff, right shoulder;  Surgeon: Corky Mull, MD;   Location: [place];  Service: Orthopedics;  Laterality: Right;    Prior to Admission medications   Medication Sig Start Date End Date Taking? Authorizing Provider  HYDROcodone-acetaminophen (NORCO) 5-325 MG tablet Take 1-2 tablets by mouth every 6 (six) hours as needed for moderate pain. MAXIMUM TOTAL ACETAMINOPHEN DOSE IS 4000 MG PER DAY 08/22/19   Poggi, Marshall Cork, MD    Allergies  Allergen Reactions  . Banana Swelling    throat  . Penicillins Rash    Has patient had a PCN reaction causing immediate rash, facial/tongue/throat swelling, SOB or lightheadedness with hypotension:unsure Has patient had a PCN reaction causing severe rash involving mucus membranes or skin necrosis:No Has patient had a PCN reaction that required hospitalization:No Has patient had a PCN reaction occurring within the last 10 years:No If all of the above answers are "NO", then may proceed with Cephalosporin use.   . Quinidine Rash    History reviewed. No pertinent family history.  Social History Social History   Tobacco Use  . Smoking status: Never Smoker  . Smokeless tobacco: Never Used  Vaping Use  . Vaping Use: Never used  Substance Use Topics  . Alcohol use: Yes    Alcohol/week: 2.0 standard drinks    Types: 2 Cans of beer per week    Comment: occasionally  . Drug use: No    Review of Systems Constitutional: Negative for fever Cardiovascular: Negative for chest pain. Respiratory: Negative for shortness of breath. Gastrointestinal: Negative for abdominal pain, vomiting and diarrhea. Musculoskeletal: Negative for musculoskeletal complaints Neurological: Moderate headache.  Tingling of  his left lower extremity yesterday but none today. All other ROS negative  ____________________________________________   PHYSICAL EXAM:  VITAL SIGNS: ED Triage Vitals [12/24/19 1727]  Enc Vitals Group     BP (!) 156/96     Pulse Rate 74     Resp 18     Temp 99.4 F (37.4 C)     Temp Source  Oral     SpO2 98 %     Weight 240 lb (108.9 kg)     Height      Head Circumference      [place] Flow      Pain Score 3     Pain Loc      Pain Edu?      Excl. in [place]?     Constitutional: Alert and oriented. Well appearing and in no distress. Eyes: Normal exam ENT      Head: Normocephalic and atraumatic.      Mouth/Throat: Mucous membranes are moist. Cardiovascular: Normal rate, regular rhythm. Respiratory: Normal respiratory effort without tachypnea nor retractions. Breath sounds are clear  Gastrointestinal: Soft and nontender. No distention.   Musculoskeletal: Nontender with normal range of motion in all extremities.  Neurologic:  Normal speech and language. No gross focal neurologic deficits Skin:  Skin is warm, dry and intact.  Psychiatric: Mood and affect are normal.  ____________________________________________    EKG  EKG viewed and interpreted by myself shows a sinus rhythm at 73 bpm with a narrow QRS, normal axis, normal intervals, nonspecific but no concerning ST changes.    INITIAL IMPRESSION / ASSESSMENT AND PLAN / ED COURSE  Pertinent labs & imaging results that were available during my care of the patient were reviewed by me and considered in my medical decision making (see chart for details).   Patient presents emergency department for intermittent dizziness and lightheadedness especially upon standing for the last 2 days.  Overall the patient appears well.  He does state occasional tingling in his left lower extremity yesterday but none today.  Patient does state moderate headache over the past 2 days.  History of migraines in the past.  Patient states he has had tingling at times in the past with his migraines.  Differential would include complex/complicated migraine, dehydration, orthostatic hypotension.  We will obtain a CT scan of the head, add on a cardiac enzyme and continue to closely monitor.  Patient agreeable to plan of care  Patient's lab work thus far is  overall reassuring.  EKG reassuring.  Patient CT scan shows age-indeterminate ischemic changes. We will proceed with MRI to further evaluate. Patient agreeable to plan of care. We will dose Valium as the patient states he is claustrophobic.  MRI is negative for acute abnormality.  Patient will follow up with his PCP.  Carlos Nolan was evaluated in Emergency Department on 12/24/2019 for the symptoms described in the history of present illness. He was evaluated in the context of the global COVID-19 pandemic, which necessitated consideration that the patient might be at risk for infection with the SARS-CoV-2 virus that causes COVID-19. Institutional protocols and algorithms that pertain to the evaluation of patients at risk for COVID-19 are in a state of rapid change based on information released by regulatory bodies including the CDC and federal and state organizations. These policies and algorithms were followed during the patient's care in the ED.  ____________________________________________   FINAL CLINICAL IMPRESSION(S) / ED DIAGNOSES  Dizziness   Harvest Dark, MD 12/24/19 2244  Harvest Dark, MD 12/24/19 2314

## 2019-12-24 NOTE — ED Triage Notes (Signed)
Pt arrives [facility] for c/o dizziness and lightheadedness that started mid day yesterday. Pt reports dizziness worse when standing up. Pt speech clear, NAD, skin warm and dry. Pt reports left foot felt like it was tingling yesterday but states this has subsided. PT hand grips strong and equal, face symmetrical, gait steady from lobby.

## 2019-12-24 NOTE — ED Notes (Signed)
Pt presents w/ dizziness w/ standing since yesterday. Pt states he is light headed w/ sitting at bedside now. Pt also c/o intermittent left sided weakness. Pt denies any med hx other than orthopedic surgeries.

## 2019-12-25 ENCOUNTER — Inpatient Hospital Stay
Admission: EM | Admit: 2019-12-25 | Discharge: 2019-12-26 | DRG: 066 | Disposition: A | Discharge status: Home Health | Payer: 59 | Attending: Hospitalist | Admitting: Hospitalist

## 2019-12-25 ENCOUNTER — Encounter: Payer: Self-pay | Admitting: Emergency Medicine

## 2019-12-25 ENCOUNTER — Emergency Department: Payer: 59

## 2019-12-25 ENCOUNTER — Other Ambulatory Visit: Payer: Self-pay

## 2019-12-25 DIAGNOSIS — Z888 Allergy status to other drugs, medicaments and biological substances status: Secondary | ICD-10-CM

## 2019-12-25 DIAGNOSIS — G43909 Migraine, unspecified, not intractable, without status migrainosus: Secondary | ICD-10-CM | POA: Diagnosis present

## 2019-12-25 DIAGNOSIS — Z79891 Long term (current) use of opiate analgesic: Secondary | ICD-10-CM

## 2019-12-25 DIAGNOSIS — R519 Headache, unspecified: Secondary | ICD-10-CM

## 2019-12-25 DIAGNOSIS — I639 Cerebral infarction, unspecified: Principal | ICD-10-CM | POA: Diagnosis present

## 2019-12-25 DIAGNOSIS — I4891 Unspecified atrial fibrillation: Secondary | ICD-10-CM | POA: Diagnosis present

## 2019-12-25 DIAGNOSIS — Z91018 Allergy to other foods: Secondary | ICD-10-CM

## 2019-12-25 DIAGNOSIS — Z880 Allergy status to penicillin: Secondary | ICD-10-CM

## 2019-12-25 DIAGNOSIS — Z9889 Other specified postprocedural states: Secondary | ICD-10-CM

## 2019-12-25 DIAGNOSIS — R200 Anesthesia of skin: Secondary | ICD-10-CM

## 2019-12-25 DIAGNOSIS — I6381 Other cerebral infarction due to occlusion or stenosis of small artery: Secondary | ICD-10-CM | POA: Diagnosis present

## 2019-12-25 DIAGNOSIS — I10 Essential (primary) hypertension: Secondary | ICD-10-CM | POA: Diagnosis not present

## 2019-12-25 DIAGNOSIS — Z8679 Personal history of other diseases of the circulatory system: Secondary | ICD-10-CM

## 2019-12-25 DIAGNOSIS — Z20822 Contact with and (suspected) exposure to covid-19: Secondary | ICD-10-CM | POA: Diagnosis present

## 2019-12-25 DIAGNOSIS — Z79899 Other long term (current) drug therapy: Secondary | ICD-10-CM

## 2019-12-25 LAB — URINALYSIS, COMPLETE (UACMP) WITH MICROSCOPIC
Bacteria, UA: NONE SEEN
Bilirubin Urine: NEGATIVE
Glucose, UA: NEGATIVE mg/dL
Hgb urine dipstick: NEGATIVE
Ketones, ur: NEGATIVE mg/dL
Leukocytes,Ua: NEGATIVE
Nitrite: NEGATIVE
Protein, ur: NEGATIVE mg/dL
Specific Gravity, Urine: 1.017 (ref 1.005–1.030)
WBC, UA: NONE SEEN WBC/hpf (ref 0–5)
pH: 5 (ref 5.0–8.0)

## 2019-12-25 LAB — COMPREHENSIVE METABOLIC PANEL
ALT: 26 U/L (ref 0–44)
AST: 20 U/L (ref 15–41)
Albumin: 4.2 g/dL (ref 3.5–5.0)
Alkaline Phosphatase: 101 U/L (ref 38–126)
Anion gap: 10 (ref 5–15)
BUN: 19 mg/dL (ref 6–20)
CO2: 24 mmol/L (ref 22–32)
Calcium: 8.9 mg/dL (ref 8.9–10.3)
Chloride: 99 mmol/L (ref 98–111)
Creatinine, Ser: 1.06 mg/dL (ref 0.61–1.24)
GFR calc Af Amer: >60 mL/min (ref >60)
GFR calc non Af Amer: >60 mL/min (ref >60)
Glucose, Bld: 120 mg/dL — ABNORMAL HIGH (ref 70–99)
Potassium: 3.5 mmol/L (ref 3.5–5.1)
Sodium: 133 mmol/L — ABNORMAL LOW (ref 135–145)
Total Bilirubin: 1.1 mg/dL (ref 0.3–1.2)
Total Protein: 7.6 g/dL (ref 6.5–8.1)

## 2019-12-25 LAB — CBC WITH DIFFERENTIAL/PLATELET
Abs Immature Granulocytes: 0.02 10*3/uL (ref 0.00–0.07)
Basophils Absolute: 0.1 10*3/uL (ref 0.0–0.1)
Basophils Relative: 1 %
Eosinophils Absolute: 0.5 10*3/uL (ref 0.0–0.5)
Eosinophils Relative: 7 %
HCT: 42.9 % (ref 39.0–52.0)
Hemoglobin: 15.4 g/dL (ref 13.0–17.0)
Immature Granulocytes: 0 %
Lymphocytes Relative: 22 %
Lymphs Abs: 1.5 10*3/uL (ref 0.7–4.0)
MCH: 32 pg (ref 26.0–34.0)
MCHC: 35.9 g/dL (ref 30.0–36.0)
MCV: 89 fL (ref 80.0–100.0)
Monocytes Absolute: 0.6 10*3/uL (ref 0.1–1.0)
Monocytes Relative: 9 %
Neutro Abs: 4.1 10*3/uL (ref 1.7–7.7)
Neutrophils Relative %: 61 %
Platelets: 172 10*3/uL (ref 150–400)
RBC: 4.82 MIL/uL (ref 4.22–5.81)
RDW: 12 % (ref 11.5–15.5)
WBC: 6.7 10*3/uL (ref 4.0–10.5)
nRBC: 0 % (ref 0.0–0.2)

## 2019-12-25 LAB — SARS CORONAVIRUS 2 BY RT PCR (HOSPITAL ORDER, PERFORMED IN ~~LOC~~ HOSPITAL LAB): SARS Coronavirus 2: NEGATIVE

## 2019-12-25 LAB — URINE DRUG SCREEN, QUALITATIVE (ARMC ONLY)
Amphetamines, Ur Screen: NOT DETECTED
Barbiturates, Ur Screen: POSITIVE — AB
Benzodiazepine, Ur Scrn: NOT DETECTED
Cannabinoid 50 Ng, Ur ~~LOC~~: NOT DETECTED
Cocaine Metabolite,Ur ~~LOC~~: NOT DETECTED
MDMA (Ecstasy)Ur Screen: NOT DETECTED
Methadone Scn, Ur: NOT DETECTED
Opiate, Ur Screen: NOT DETECTED
Phencyclidine (PCP) Ur S: NOT DETECTED
Tricyclic, Ur Screen: NOT DETECTED

## 2019-12-25 LAB — SEDIMENTATION RATE: Sed Rate: 15 mm/hr (ref 0–20)

## 2019-12-25 LAB — TROPONIN I (HIGH SENSITIVITY)
Troponin I (High Sensitivity): 5 ng/L (ref <18)
Troponin I (High Sensitivity): 4 ng/L (ref <18)

## 2019-12-25 IMAGING — MR MR MRA NECK WO/W CM
1 series · 41 of 48 positions shown · IV contrast (Contrast agent)
Comparison: Brain MRI and head CT [DATE].

CLINICAL DATA: 54-year-old male with dizziness, left lower
extremity tingling which has now progressed to entire left side
numbness since the MRI yesterday.

EXAM:
MRI HEAD WITHOUT CONTRAST
MRA HEAD WITHOUT CONTRAST
MRA NECK WITHOUT AND WITH CONTRAST
TECHNIQUE: Multiplanar, multiecho pulse sequences of the brain and surrounding
structures were obtained without contrast. Angiographic images of
the Circle of Willis were obtained using MRA technique without
intravenous contrast. Angiographic images of the neck were obtained
using MRA technique without and with intravenous contrast. Carotid
stenosis measurements (when applicable) are obtained utilizing
NASCET criteria, using the distal internal carotid diameter as the
denominator.
CONTRAST:  10mL GADAVIST GADOBUTROL 1 MMOL/ML IV SOLN

[Series 38: angio_fl3d_cor_post_ttc=2.0s_moco-adv_sub · coronal · 0.9mm · 0.85mm/px · 41 of 95 slices shown]
[im 1/95]
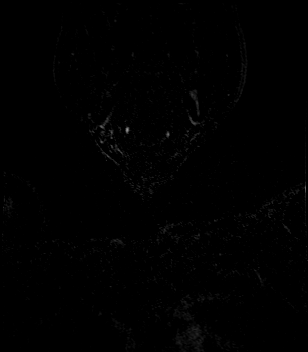
[im 3/95]
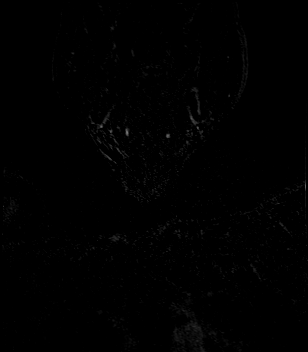
[im 5/95]
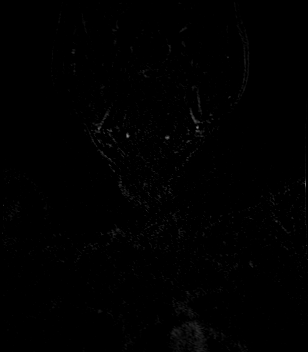
[im 7/95]
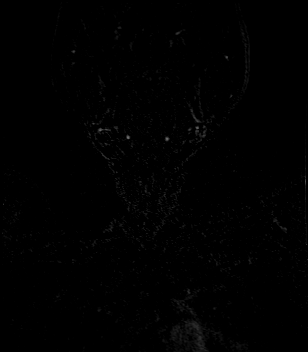
[im 9/95]
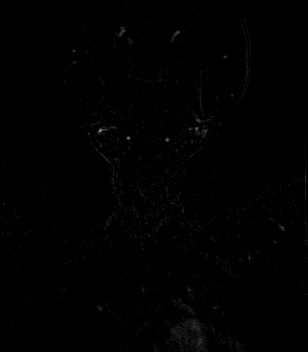
[im 11/95]
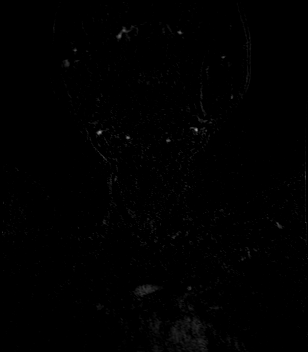
[im 13/95]
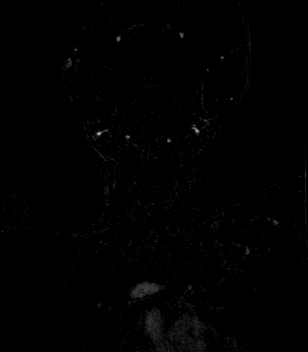
[im 15/95]
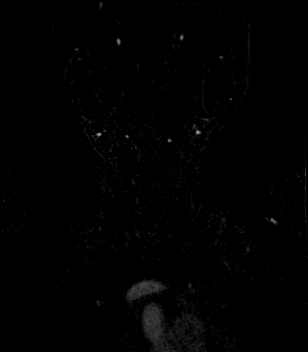
[im 17/95]
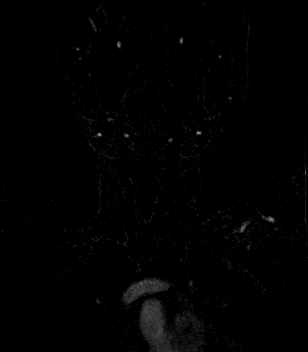
[im 19/95]
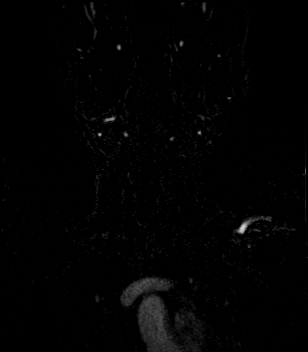
[im 21/95]
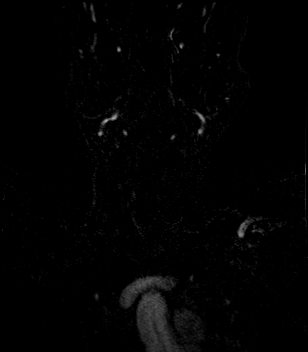
[im 23/95]
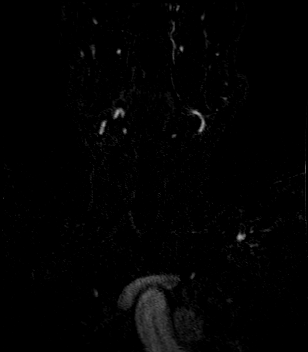
[im 25/95]
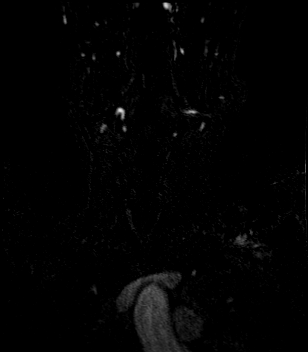
[im 27/95]
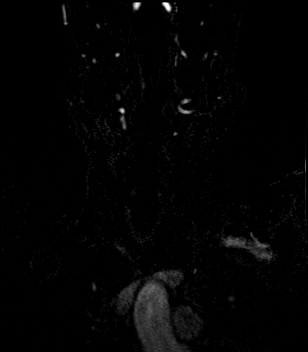
[im 29/95]
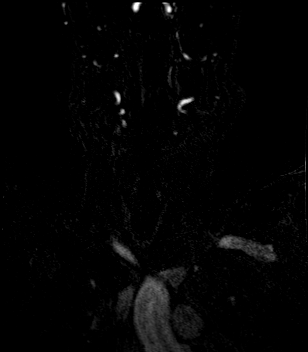
[im 31/95]
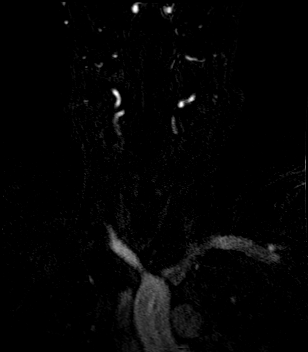
[im 33/95]
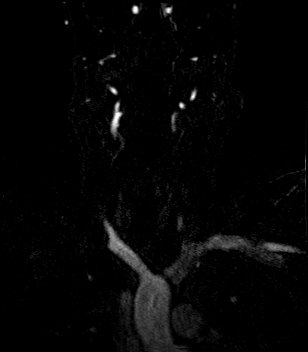
[im 35/95]
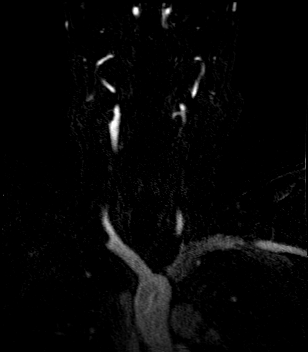
[im 37/95]
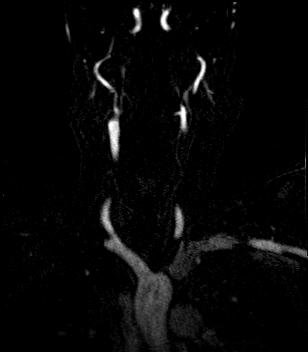
[im 39/95]
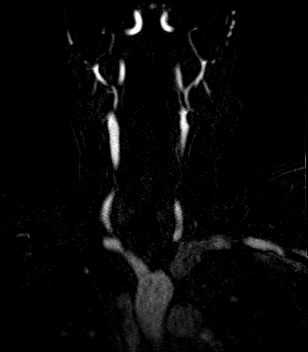
[im 41/95]
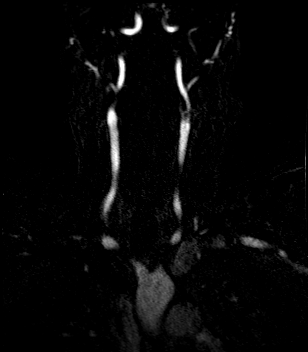
[im 43/95]
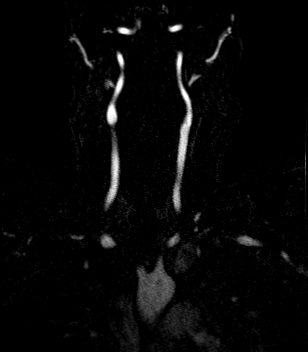
[im 45/95]
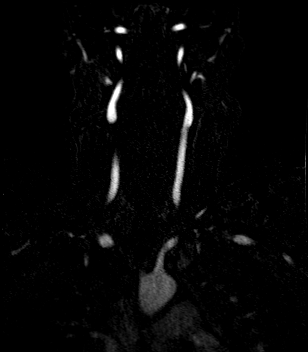
[im 47/95]
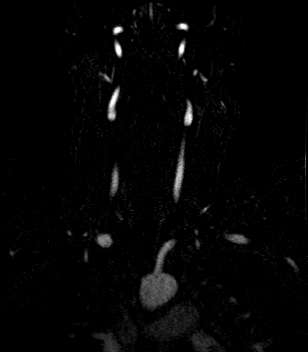
[im 49/95]
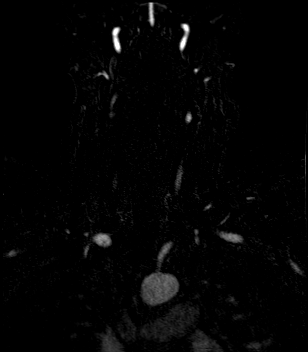
[im 51/95]
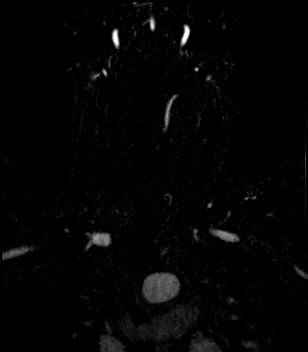
[im 53/95]
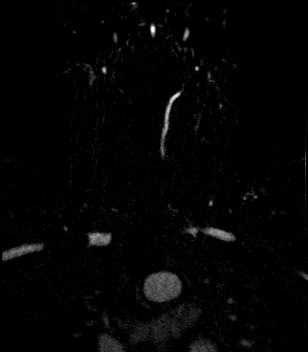
[im 55/95]
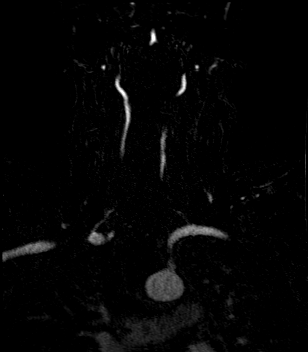
[im 57/95]
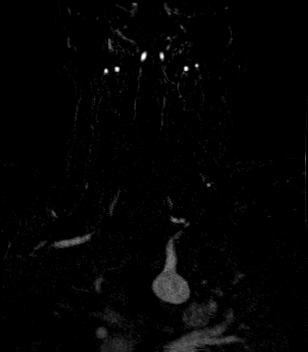
[im 59/95]
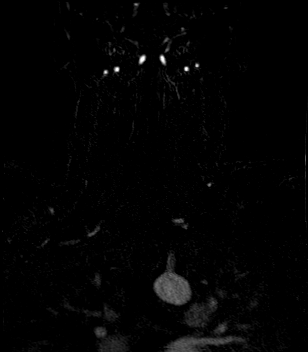
[im 61/95]
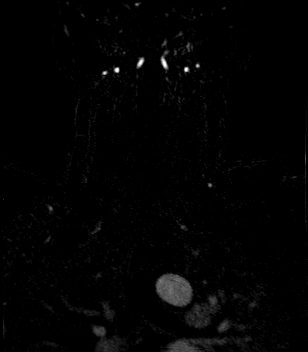
[im 63/95]
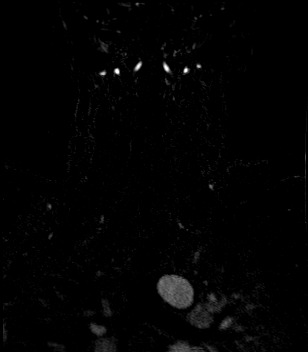
[im 65/95]
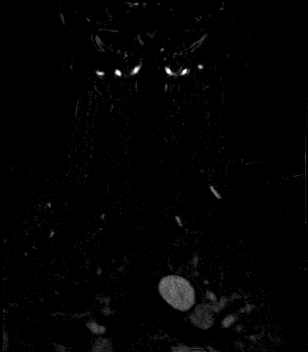
[im 67/95]
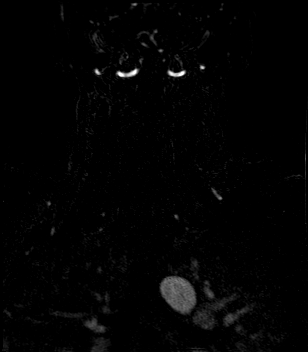
[im 69/95]
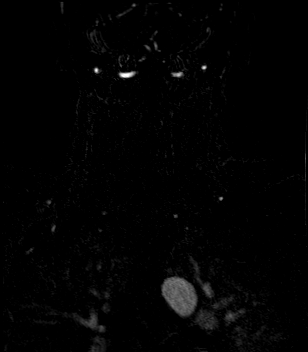
[im 71/95]
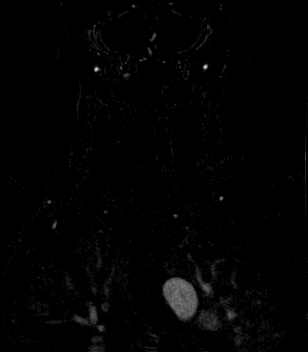
[im 73/95]
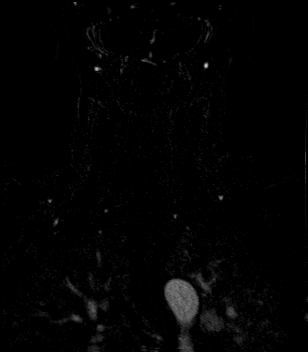
[im 75/95]
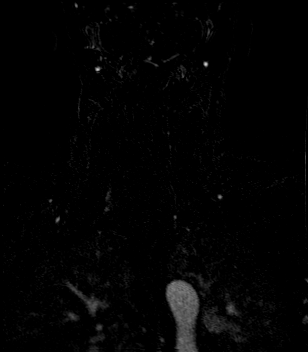
[im 79/95]
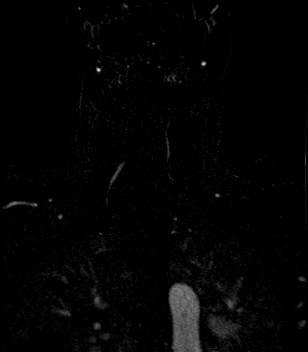
[im 81/95]
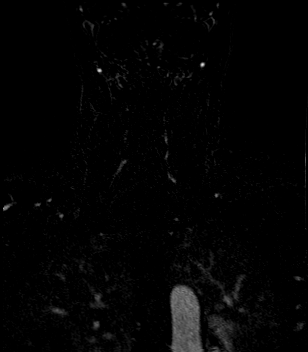
[im 91/95]
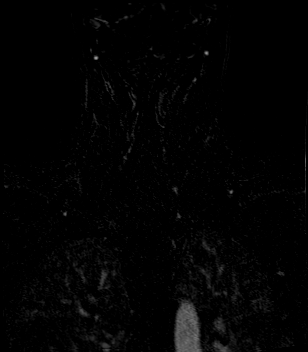

[41 of 48 positions shown; findings below may reference images not displayed]

FINDINGS: Study is intermittently degraded by motion artifact despite repeated
imaging attempts.

MRI HEAD FINDINGS

Brain: Subtle restricted diffusion suspected in the lateral right
thalamus now, near the posterior limb right external capsule (series
27, image 25). Faint T2 and FLAIR hyperintensity. No associated
hemorrhage or mass effect.

No other restricted diffusion.

No midline shift, mass effect, evidence of mass lesion,
ventriculomegaly, extra-axial collection or acute intracranial
hemorrhage. Cervicomedullary junction and pituitary are within
normal limits.

Advanced bilateral cerebral white matter T2 and FLAIR hyperintensity
redemonstrated in both hemispheres,, and in a nonspecific
configuration. Some of these areas most resemble chronic white
matter lacunar infarcts, although demyelinating disease would also
be a consideration. On T2* imaging there is evidence of at least 1
chronic microhemorrhage in the right hemisphere (series 24, image
20). No cortical encephalomalacia identified.

Vascular: Major intracranial vascular flow voids appear stable since
yesterday.

Skull and upper cervical spine: Negative.

Sinuses/Orbits: Stable, negative.

Other: Mastoids remain clear. Grossly normal visible internal
auditory structures.

MRA NECK FINDINGS

Precontrast time-of-flight neck MRA images reveal antegrade flow in
both cervical carotid and vertebral arteries. Both carotid
bifurcations appear patent and normal.

Post-contrast neck MRA images reveal a 3 vessel arch configuration.
The proximal great vessels appear normal.

The right CCA, right carotid bifurcation and cervical right ICA
appear normal.

The left CCA appears normal aside from mild tortuosity. The left
carotid bifurcation and cervical left ICA appear normal.

Both proximal subclavian arteries and vertebral artery origins are
within normal limits. Mildly tortuous left V1 segment. Codominant
vertebral arteries appear normal to the skull base.

MRA HEAD FINDINGS

Antegrade flow in the posterior circulation with codominant distal
vertebral arteries. Patent PICA origins. Patent vertebrobasilar
junction. No distal vertebral or basilar stenosis. Patent SCA and
PCA origins. Fetal type right PCA origin. The left posterior
communicating artery is diminutive or absent. Bilateral PCA branches
appear patent. There is mild irregularity and stenosis in both PCA
P2 segments.

Antegrade flow in both ICA siphons. No siphon stenosis. Ophthalmic
and right posterior communicating artery origins appear normal.
Patent carotid termini. Normal MCA and ACA origins. Right ACA A1
segment is mildly dominant. Anterior communicating artery and
visible ACA branches are within normal limits. MCA M1 segments and
MCA bifurcations are patent. Visible bilateral MCA branches are
within normal limits.
IMPRESSION: 1. Subtle acute lacunar infarct in the lateral right thalamus, near
the posterior limb right external capsule and concordant with left
side symptoms. No associated hemorrhage or mass effect.

2. No other acute intracranial abnormality. Advanced but nonspecific
cerebral white matter disease re-demonstrated. Top differential
considerations are accelerated small-vessel disease and
demyelinating disease.

3. Negative Neck MRA.

4. Intracranial MRA is negative aside from some evidence of
bilateral PCA P2 segment atherosclerosis, with only mild stenosis.

## 2019-12-25 MED ORDER — LORAZEPAM 2 MG/ML IJ SOLN: 1.0000 mg | Freq: Once | INTRAMUSCULAR | Status: DC

## 2019-12-25 MED ORDER — ATORVASTATIN CALCIUM 20 MG PO TABS
40.0000 mg | ORAL_TABLET | Freq: Every day | ORAL | Status: DC
  Administered 2019-12-25: 40 mg via ORAL
  Filled 2019-12-25: qty 2

## 2019-12-25 MED ORDER — SODIUM CHLORIDE 0.9 % IV BOLUS
1000.0000 mL | Freq: Once | INTRAVENOUS | Status: AC
  Administered 2019-12-25: 1000 mL via INTRAVENOUS

## 2019-12-25 MED ORDER — ENOXAPARIN SODIUM 40 MG/0.4ML ~~LOC~~ SOLN: 40.0000 mg | SUBCUTANEOUS | Status: DC

## 2019-12-25 MED ORDER — GADOBUTROL 1 MMOL/ML IV SOLN
10.0000 mL | Freq: Once | INTRAVENOUS | Status: AC | PRN
  Administered 2019-12-25: 10 mL via INTRAVENOUS

## 2019-12-25 MED ORDER — ASPIRIN EC 325 MG PO TBEC
325.0000 mg | DELAYED_RELEASE_TABLET | Freq: Every day | ORAL | Status: DC
  Administered 2019-12-25 – 2019-12-26 (×2): 325 mg via ORAL
  Filled 2019-12-25 (×2): qty 1

## 2019-12-25 MED ORDER — ACETAMINOPHEN 325 MG PO TABS: 650.0000 mg | ORAL_TABLET | ORAL | Status: DC | PRN

## 2019-12-25 MED ORDER — PROCHLORPERAZINE EDISYLATE 10 MG/2ML IJ SOLN
10.0000 mg | Freq: Once | INTRAMUSCULAR | Status: AC
  Administered 2019-12-25: 10 mg via INTRAVENOUS
  Filled 2019-12-25: qty 2

## 2019-12-25 MED ORDER — DEXAMETHASONE SODIUM PHOSPHATE 10 MG/ML IJ SOLN
6.0000 mg | Freq: Once | INTRAMUSCULAR | Status: AC
  Administered 2019-12-25: 6 mg via INTRAVENOUS
  Filled 2019-12-25: qty 1

## 2019-12-25 MED ORDER — STROKE: EARLY STAGES OF RECOVERY BOOK: Freq: Once | Status: DC

## 2019-12-25 MED ORDER — LORAZEPAM 2 MG/ML IJ SOLN
4.0000 mg | Freq: Once | INTRAMUSCULAR | Status: AC
  Administered 2019-12-25: 4 mg via INTRAVENOUS
  Filled 2019-12-25: qty 2

## 2019-12-25 MED ORDER — LORAZEPAM 2 MG/ML IJ SOLN
1.0000 mg | Freq: Once | INTRAMUSCULAR | Status: AC
  Administered 2019-12-25: 1 mg via INTRAVENOUS
  Filled 2019-12-25: qty 1

## 2019-12-25 MED ORDER — ACETAMINOPHEN 160 MG/5ML PO SOLN
650.0000 mg | ORAL | Status: DC | PRN
  Filled 2019-12-25: qty 20.3

## 2019-12-25 MED ORDER — ACETAMINOPHEN 650 MG RE SUPP: 650.0000 mg | RECTAL | Status: DC | PRN

## 2019-12-25 NOTE — ED Provider Notes (Addendum)
[facility] Emergency Department Provider Note   ____________________________________________   First MD Initiated Contact with Patient 12/25/19 [phone]     (approximate)  I have reviewed the triage vital signs and the nursing notes.   HISTORY  Chief Complaint Numbness and Dizziness    HPI Carlos Nolan is a 54 y.o. male who was seen yesterday for wellness and on his left side with some dizziness and chest pain had a CT and MRI which were negative.  He woke up this morning and complained of a little bit of chest pain dizziness and numbness on his left arm and leg this time somewhat worse numbness than last time.  He does not have a headache as he did yesterday.  As I understand it dizziness acute disease not spinning or lightheaded.  Chest tightness was brief.  Is gone now.         Past Medical History:  Diagnosis Date  . Atrial fibrillation [facility])     Patient Active Problem List   Diagnosis Date Noted  . Food impaction of esophagus     Past Surgical History:  Procedure Laterality Date  . APPENDECTOMY    . CARDIAC ELECTROPHYSIOLOGY STUDY AND ABLATION  04/24/2015  . ELBOW FRACTURE SURGERY    . ESOPHAGOGASTRODUODENOSCOPY N/A 01/11/2016   Procedure: ESOPHAGOGASTRODUODENOSCOPY (EGD);  Surgeon: Mauri Pole, MD;  Location: [facility];  Service: Endoscopy;  Laterality: N/A;  . FEMUR FRACTURE SURGERY    . HARDWARE REMOVAL Left 08/22/2019   Procedure: HARDWARE REMOVAL FROM LEFT ELBOW;  Surgeon: Corky Mull, MD;  Location: [facility];  Service: Orthopedics;  Laterality: Left;  . SHOULDER ARTHROSCOPY Right 06/15/2016   Procedure: Arthroscopic labral debridement, arthroscopic subacromial decompression, and mini-open bursectomy with exploration of rotator cuff, right shoulder;  Surgeon: Corky Mull, MD;  Location: [facility];  Service: Orthopedics;  Laterality: Right;    Prior to Admission medications   Medication Sig Start Date End Date  Taking? Authorizing Provider  butalbital-acetaminophen-caffeine (FIORICET) 50-325-40 MG tablet Take 1-2 tablets by mouth every 6 (six) hours as needed for headache. 12/24/19 12/23/20  Harvest Dark, MD  hydrochlorothiazide (HYDRODIURIL) 25 MG tablet Take 1 tablet (25 mg total) by mouth daily. 12/24/19   Harvest Dark, MD  HYDROcodone-acetaminophen (NORCO) 5-325 MG tablet Take 1-2 tablets by mouth every 6 (six) hours as needed for moderate pain. MAXIMUM TOTAL ACETAMINOPHEN DOSE IS 4000 MG PER DAY 08/22/19   Poggi, Marshall Cork, MD    Allergies Banana, Penicillins, and Quinidine  No family history on file.  Social History Social History   Tobacco Use  . Smoking status: Never Smoker  . Smokeless tobacco: Never Used  Vaping Use  . Vaping Use: Never used  Substance Use Topics  . Alcohol use: Yes    Alcohol/week: 2.0 standard drinks    Types: 2 Cans of beer per week    Comment: occasionally  . Drug use: No    Review of Systems  Constitutional: No fever/chills Eyes: No visual changes. ENT: No sore throat. Cardiovascular: Currently denies chest pain. Respiratory: Denies shortness of breath. Gastrointestinal: No abdominal pain.  No nausea, no vomiting.  No diarrhea.  No constipation. Genitourinary: Negative for dysuria. Musculoskeletal: Negative for back pain. Skin: Negative for rash. Neurological: Currently negative for headaches, focal weakness   ____________________________________________   PHYSICAL EXAM:  VITAL SIGNS: ED Triage Vitals  Enc Vitals Group     BP 12/25/19 0724 (!) 146/94     Pulse Rate  12/25/19 0724 65     Resp 12/25/19 0724 16     Temp 12/25/19 0724 98 F (36.7 C)     Temp Source 12/25/19 0724 Oral     SpO2 12/25/19 0724 96 %     Weight 12/25/19 0717 240 lb (108.9 kg)     Height 12/25/19 0717 6\' 1"  (1.854 m)     Head Circumference --      [place] Flow --      Pain Score 12/25/19 0717 5     Pain Loc --      Pain Edu? --      Excl. in [place]? --     Constitutional: Alert and oriented. Well appearing and in no acute distress. Eyes: Conjunctivae are normal. PER. EOMI. Head: Atraumatic. Nose: No congestion/rhinnorhea. Mouth/Throat: Mucous membranes are moist.  Oropharynx non-erythematous. Neck: No stridor. Cardiovascular: Normal rate, regular rhythm! Grossly normal heart sounds.  Good peripheral circulation. Respiratory: Normal respiratory effort.  No retractions. Lungs CTAB. Gastrointestinal: Soft and nontender. No distention. No abdominal bruits. No CVA tenderness. Musculoskeletal: No lower extremity tenderness nor edema.   Neurologic:  Normal speech and language. No gross focal neurologic deficits are appreciated.  Cranial nerves II through XII are intact.  Visual fields were checked by confrontation laterally only.  Cerebellar finger-to-nose heel-to-shin and rapid alternating movements in the hands are all normal.  Motor strength is 5/5 throughout.  Patient reports numbness and left arm and leg.  No gait instability. Skin:  Skin is warm, dry and intact. No rash noted   ____________________________________________   LABS (all labs ordered are listed, but only abnormal results are displayed)  Labs Reviewed  COMPREHENSIVE METABOLIC PANEL - Abnormal; Notable for the following components:      Result Value   Sodium 133 (*)    Glucose, Bld 120 (*)    All other components within normal limits  CBC WITH DIFFERENTIAL/PLATELET  SEDIMENTATION RATE  URINALYSIS, COMPLETE (UACMP) WITH MICROSCOPIC  URINE DRUG SCREEN, QUALITATIVE ([facility] ONLY)  TROPONIN I (HIGH SENSITIVITY)  TROPONIN I (HIGH SENSITIVITY)   ____________________________________________  EKG  EKG read interpreted by me shows normal sinus rhythm at a rate of 69 rightward axis no acute ST-T wave changes ____________________________________________  RADIOLOGY  ED MD interpretation: Rest x-ray read by radiology reviewed by me shows no acute disease  Official radiology  report(s): DG Chest 2 View  Result Date: 12/25/2019 CLINICAL DATA:  Chest pain. Additional provided: Left sided numbness. EXAM: CHEST - 2 VIEW COMPARISON:  Prior chest radiographs 05/30/2013 and earlier FINDINGS: Heart size within normal limits. No appreciable airspace consolidation or pulmonary edema. No evidence of pleural effusion or pneumothorax. No acute bony abnormality identified. IMPRESSION: No evidence of active cardiopulmonary disease. Electronically Signed   By: Kellie Simmering DO   On: 12/25/2019 08:09   CT Head Wo Contrast  Result Date: 12/24/2019 CLINICAL DATA:  Dizziness with standing since yesterday, lightheadedness, intermittent left-sided weakness EXAM: CT HEAD WITHOUT CONTRAST TECHNIQUE: Contiguous axial images were obtained from the base of the skull through the vertex without intravenous contrast. COMPARISON:  None. FINDINGS: Brain: Hypodensities are seen within the bilateral parietal periventricular white matter, as well as a focal hypodensity within the right frontal subcortical white matter. These findings are consistent with age indeterminate ischemic change. No other signs of acute infarct or hemorrhage. Lateral ventricles and midline structures are unremarkable. No acute extra-axial fluid collections. No mass effect. Vascular: No hyperdense vessel or unexpected calcification. Skull: Normal. Negative for fracture or  focal lesion. Sinuses/Orbits: No acute finding. Other: None. IMPRESSION: 1. Age-indeterminate small vessel ischemic changes within the right frontal subcortical white matter and bilateral parietal periventricular white matter. 2. No acute hemorrhage. Electronically Signed   By: Randa Ngo M.D.   On: 12/24/2019 21:04   MR BRAIN WO CONTRAST  Result Date: 12/24/2019 CLINICAL DATA:  Dizziness.  Left lower extremity tingling EXAM: MRI HEAD WITHOUT CONTRAST TECHNIQUE: Multiplanar, multiecho pulse sequences of the brain and surrounding structures were obtained without  intravenous contrast. COMPARISON:  None. FINDINGS: BRAIN: No acute infarct, acute hemorrhage or extra-axial collection. Multifocal white matter hyperintensity, most commonly due to chronic ischemic microangiopathy. Normal volume of CSF spaces. No chronic microhemorrhage. Normal midline structures. VASCULAR: Major flow voids are preserved. SKULL AND UPPER CERVICAL SPINE: Normal calvarium and skull base. Visualized upper cervical spine and soft tissues are normal. SINUSES/ORBITS: No paranasal sinus fluid levels or advanced mucosal thickening. No mastoid or middle ear effusion. Normal orbits. IMPRESSION: 1. No acute intracranial abnormality. 2. Multifocal white matter hyperintensity, most commonly due to chronic ischemic microangiopathy. Electronically Signed   By: Ulyses Jarred M.D.   On: 12/24/2019 23:10    ____________________________________________   PROCEDURES  Procedure(s) performed (including Critical Care):  Procedures   ____________________________________________   INITIAL IMPRESSION / ASSESSMENT AND PLAN / ED COURSE  Patient's numbness is now worse today.  We will repeat the MRI.  This may just be a complex migraine with residual numbness. On further questioning patient reports his chest tightness was more numbness in his whole left side arm and leg chest etc. was tight.  Currently not his face though this is all better except his arm and the leg still feel tight and numb.    ----------------------------------------- 11:15 AM on 12/25/2019 -----------------------------------------  Patient seen by neurology.  Dr. Irish Elders also feels the patient should get an MRI and he had his MRA.  We will give him some Decadron 6 mg IV per Dr. Irish Elders and I will add some Compazine.  Hopefully the gentleman's headache will get better and the numbness will go away.   ----------------------------------------- 2:19 PM on 12/25/2019 -----------------------------------------  Patient could not  tolerate MRI in spite of 2 mg of morphine.  He did not seem very sleepy after procedure.  We will try again with 4 mg of morphine.  Patient reports headache is better but not gone and numbness is also better but not gone.  MRI cannot fit him in again until after 3.  I will sign this gentleman out to the oncoming provider.     ____________________________________________   FINAL CLINICAL IMPRESSION(S) / ED DIAGNOSES  Final diagnoses:  Numbness     ED Discharge Orders    None       Note:  This document was prepared using Dragon voice recognition software and may include unintentional dictation errors.    Nena Polio, MD 12/25/19 1419 Signed out to oncoming provider   Nena Polio, MD 12/25/19 1520

## 2019-12-25 NOTE — ED Triage Notes (Signed)
Pt reports had a HA all day long and he was given medications for migraines. Pt had a CT and MRI completed yesterday.

## 2019-12-25 NOTE — ED Notes (Signed)
MRI states that they will be able to scan patient approx 330PM. Waiting to give mediations for anxiety due to MRI at that time.

## 2019-12-25 NOTE — H&P (Signed)
History and Physical    Carlos Nolan WGN:[id number] DOB: 02/11/1966 DOA: 12/25/2019  PCP: Patient, No Pcp Per   Patient coming from: Home  I have personally briefly reviewed patient's old medical records in [place]  Chief Complaint: Numbness left side  HPI: Carlos Nolan is a 54 y.o. male with history of A. fib status post ablation 2017 who presented to the ED for the second time in 2 days with numbness of the left side associated with headache and dizziness.  On his first visit on December 24, 2019 he had associated chest pain and headache.  He had an MRI that showed no acute abnormality.  He was discharged to follow-up with his PCP.  He returned on the morning of 8/18 with recurrent symptoms, associated with dizziness and worsening numbness of the left arm and left leg though with improvement of his headache.  He was evaluated by neurologist, Dr. Irish Elders in the emergency room who recommended MRI/MRA, which returned showing several acute lacunar infarct in the lateral right thalamus near the posterior limb right external capsule and concordant with left-sided symptoms.  Hospitalist consulted for admission. Review of Systems: As per HPI otherwise all other systems on review of systems negative.    Past Medical History:  Diagnosis Date  . Atrial fibrillation [facility])     Past Surgical History:  Procedure Laterality Date  . APPENDECTOMY    . CARDIAC ELECTROPHYSIOLOGY STUDY AND ABLATION  04/24/2015  . ELBOW FRACTURE SURGERY    . ESOPHAGOGASTRODUODENOSCOPY N/A 01/11/2016   Procedure: ESOPHAGOGASTRODUODENOSCOPY (EGD);  Surgeon: Mauri Pole, MD;  Location: [facility];  Service: Endoscopy;  Laterality: N/A;  . FEMUR FRACTURE SURGERY    . HARDWARE REMOVAL Left 08/22/2019   Procedure: HARDWARE REMOVAL FROM LEFT ELBOW;  Surgeon: Corky Mull, MD;  Location: [facility];  Service: Orthopedics;  Laterality: Left;  . SHOULDER ARTHROSCOPY Right 06/15/2016   Procedure: Arthroscopic  labral debridement, arthroscopic subacromial decompression, and mini-open bursectomy with exploration of rotator cuff, right shoulder;  Surgeon: Corky Mull, MD;  Location: [place];  Service: Orthopedics;  Laterality: Right;     reports that he has never smoked. He has never used smokeless tobacco. He reports current alcohol use of about 2.0 standard drinks of alcohol per week. He reports that he does not use drugs.  Allergies  Allergen Reactions  . Banana Swelling    throat  . Penicillins Rash    Has patient had a PCN reaction causing immediate rash, facial/tongue/throat swelling, SOB or lightheadedness with hypotension:unsure Has patient had a PCN reaction causing severe rash involving mucus membranes or skin necrosis:No Has patient had a PCN reaction that required hospitalization:No Has patient had a PCN reaction occurring within the last 10 years:No If all of the above answers are "NO", then may proceed with Cephalosporin use.   . Quinidine Rash    History reviewed. No pertinent family history.    Prior to Admission medications   Medication Sig Start Date End Date Taking? Authorizing Provider  butalbital-acetaminophen-caffeine (FIORICET) 50-325-40 MG tablet Take 1-2 tablets by mouth every 6 (six) hours as needed for headache. 12/24/19 12/23/20  Harvest Dark, MD  hydrochlorothiazide (HYDRODIURIL) 25 MG tablet Take 1 tablet (25 mg total) by mouth daily. 12/24/19   Harvest Dark, MD  HYDROcodone-acetaminophen (NORCO) 5-325 MG tablet Take 1-2 tablets by mouth every 6 (six) hours as needed for moderate pain. MAXIMUM TOTAL ACETAMINOPHEN DOSE IS 4000 MG PER DAY 08/22/19   Poggi, Jenny Reichmann  J, MD    Physical Exam: Vitals:   12/25/19 1216 12/25/19 1635 12/25/19 1837 12/25/19 2113  BP: (!) 144/94 (!) 151/97 (!) 156/91 (!) 146/62  Pulse: 70 81 77 91  Resp: 18 16 14 16   Temp: 98 F (36.7 C)     TempSrc: Oral     SpO2: 95% 97% 97% 95%  Weight:      Height:          Vitals:   12/25/19 1216 12/25/19 1635 12/25/19 1837 12/25/19 2113  BP: (!) 144/94 (!) 151/97 (!) 156/91 (!) 146/62  Pulse: 70 81 77 91  Resp: 18 16 14 16   Temp: 98 F (36.7 C)     TempSrc: Oral     SpO2: 95% 97% 97% 95%  Weight:      Height:          Constitutional: Alert and oriented x 3 . Not in any apparent distress HEENT:      Head: Normocephalic and atraumatic.         Eyes: PERLA, EOMI, Conjunctivae are normal. Sclera is non-icteric.       Mouth/Throat: Mucous membranes are moist.       Neck: Supple with no signs of meningismus. Cardiovascular: Regular rate and rhythm. No murmurs, gallops, or rubs. 2+ symmetrical distal pulses are present . No JVD. No LE edema Respiratory: Respiratory effort normal .Lungs sounds clear bilaterally. No wheezes, crackles, or rhonchi.  Gastrointestinal: Soft, non tender, and non distended with positive bowel sounds. No rebound or guarding. Genitourinary: No CVA tenderness. Musculoskeletal: Nontender with normal range of motion in all extremities. No cyanosis, or erythema of extremities. Neurologic: Normal speech and language. Face is symmetric. Moving all extremities. No gross focal neurologic deficits . Skin: Skin is warm, dry.  No rash or ulcers Psychiatric: Mood and affect are normal Speech and behavior are normal   Labs on Admission: I have personally reviewed following labs and imaging studies  CBC: Recent Labs  Lab 12/24/19 1750 12/25/19 0725  WBC 7.6 6.7  NEUTROABS  --  4.1  HGB 15.2 15.4  HCT 41.8 42.9  MCV 86.5 89.0  PLT 186 833   Basic Metabolic Panel: Recent Labs  Lab 12/24/19 1750 12/25/19 0725  NA 139 133*  K 3.9 3.5  CL 105 99  CO2 26 24  GLUCOSE 105* 120*  BUN 20 19  CREATININE 0.93 1.06  CALCIUM 8.9 8.9   GFR: Estimated Creatinine Clearance: 100.7 mL/min (by C-G formula based on SCr of 1.06 mg/dL). Liver Function Tests: Recent Labs  Lab 12/25/19 0725  AST 20  ALT 26  ALKPHOS 101  BILITOT  1.1  PROT 7.6  ALBUMIN 4.2   No results for input(s): LIPASE, AMYLASE in the last 168 hours. No results for input(s): AMMONIA in the last 168 hours. Coagulation Profile: No results for input(s): INR, PROTIME in the last 168 hours. Cardiac Enzymes: No results for input(s): CKTOTAL, CKMB, CKMBINDEX, TROPONINI in the last 168 hours. BNP (last 3 results) No results for input(s): PROBNP in the last 8760 hours. HbA1C: No results for input(s): HGBA1C in the last 72 hours. CBG: No results for input(s): GLUCAP in the last 168 hours. Lipid Profile: No results for input(s): CHOL, HDL, LDLCALC, TRIG, CHOLHDL, LDLDIRECT in the last 72 hours. Thyroid Function Tests: No results for input(s): TSH, T4TOTAL, FREET4, T3FREE, THYROIDAB in the last 72 hours. Anemia Panel: No results for input(s): VITAMINB12, FOLATE, FERRITIN, TIBC, IRON, RETICCTPCT in the last 72 hours. Urine  analysis:    Component Value Date/Time   COLORURINE YELLOW (A) 12/25/2019 1419   APPEARANCEUR CLEAR (A) 12/25/2019 1419   LABSPEC 1.017 12/25/2019 1419   PHURINE 5.0 12/25/2019 1419   GLUCOSEU NEGATIVE 12/25/2019 1419   HGBUR NEGATIVE 12/25/2019 1419   BILIRUBINUR NEGATIVE 12/25/2019 1419   KETONESUR NEGATIVE 12/25/2019 1419   PROTEINUR NEGATIVE 12/25/2019 1419   NITRITE NEGATIVE 12/25/2019 1419   LEUKOCYTESUR NEGATIVE 12/25/2019 1419    Radiological Exams on Admission: DG Chest 2 View  Result Date: 12/25/2019 CLINICAL DATA:  Chest pain. Additional provided: Left sided numbness. EXAM: CHEST - 2 VIEW COMPARISON:  Prior chest radiographs 05/30/2013 and earlier FINDINGS: Heart size within normal limits. No appreciable airspace consolidation or pulmonary edema. No evidence of pleural effusion or pneumothorax. No acute bony abnormality identified. IMPRESSION: No evidence of active cardiopulmonary disease. Electronically Signed   By: Kellie Simmering DO   On: 12/25/2019 08:09   CT Head Wo Contrast  Result Date: 12/24/2019 CLINICAL  DATA:  Dizziness with standing since yesterday, lightheadedness, intermittent left-sided weakness EXAM: CT HEAD WITHOUT CONTRAST TECHNIQUE: Contiguous axial images were obtained from the base of the skull through the vertex without intravenous contrast. COMPARISON:  None. FINDINGS: Brain: Hypodensities are seen within the bilateral parietal periventricular white matter, as well as a focal hypodensity within the right frontal subcortical white matter. These findings are consistent with age indeterminate ischemic change. No other signs of acute infarct or hemorrhage. Lateral ventricles and midline structures are unremarkable. No acute extra-axial fluid collections. No mass effect. Vascular: No hyperdense vessel or unexpected calcification. Skull: Normal. Negative for fracture or focal lesion. Sinuses/Orbits: No acute finding. Other: None. IMPRESSION: 1. Age-indeterminate small vessel ischemic changes within the right frontal subcortical white matter and bilateral parietal periventricular white matter. 2. No acute hemorrhage. Electronically Signed   By: Randa Ngo M.D.   On: 12/24/2019 21:04   MR ANGIO HEAD WO CONTRAST  Result Date: 12/25/2019 CLINICAL DATA:  54 year old male with dizziness, left lower extremity tingling which has now progressed to entire left side numbness since the MRI yesterday. EXAM: MRI HEAD WITHOUT CONTRAST MRA HEAD WITHOUT CONTRAST MRA NECK WITHOUT AND WITH CONTRAST TECHNIQUE: Multiplanar, multiecho pulse sequences of the brain and surrounding structures were obtained without contrast. Angiographic images of the Circle of Willis were obtained using MRA technique without intravenous contrast. Angiographic images of the neck were obtained using MRA technique without and with intravenous contrast. Carotid stenosis measurements (when applicable) are obtained utilizing NASCET criteria, using the distal internal carotid diameter as the denominator. CONTRAST:  23mL GADAVIST GADOBUTROL 1 MMOL/ML  IV SOLN COMPARISON:  Brain MRI and head CT 12/24/2019. FINDINGS: Study is intermittently degraded by motion artifact despite repeated imaging attempts. MRI HEAD FINDINGS Brain: Subtle restricted diffusion suspected in the lateral right thalamus now, near the posterior limb right external capsule (series 27, image 25). Faint T2 and FLAIR hyperintensity. No associated hemorrhage or mass effect. No other restricted diffusion. No midline shift, mass effect, evidence of mass lesion, ventriculomegaly, extra-axial collection or acute intracranial hemorrhage. Cervicomedullary junction and pituitary are within normal limits. Advanced bilateral cerebral white matter T2 and FLAIR hyperintensity redemonstrated in both hemispheres,, and in a nonspecific configuration. Some of these areas most resemble chronic white matter lacunar infarcts, although demyelinating disease would also be a consideration. On T2* imaging there is evidence of at least 1 chronic microhemorrhage in the right hemisphere (series 24, image 20). No cortical encephalomalacia identified. Vascular: Major intracranial vascular flow voids appear  stable since yesterday. Skull and upper cervical spine: Negative. Sinuses/Orbits: Stable, negative. Other: Mastoids remain clear. Grossly normal visible internal auditory structures. MRA NECK FINDINGS Precontrast time-of-flight neck MRA images reveal antegrade flow in both cervical carotid and vertebral arteries. Both carotid bifurcations appear patent and normal. Post-contrast neck MRA images reveal a 3 vessel arch configuration. The proximal great vessels appear normal. The right CCA, right carotid bifurcation and cervical right ICA appear normal. The left CCA appears normal aside from mild tortuosity. The left carotid bifurcation and cervical left ICA appear normal. Both proximal subclavian arteries and vertebral artery origins are within normal limits. Mildly tortuous left V1 segment. Codominant vertebral arteries  appear normal to the skull base. MRA HEAD FINDINGS Antegrade flow in the posterior circulation with codominant distal vertebral arteries. Patent PICA origins. Patent vertebrobasilar junction. No distal vertebral or basilar stenosis. Patent SCA and PCA origins. Fetal type right PCA origin. The left posterior communicating artery is diminutive or absent. Bilateral PCA branches appear patent. There is mild irregularity and stenosis in both PCA P2 segments. Antegrade flow in both ICA siphons. No siphon stenosis. Ophthalmic and right posterior communicating artery origins appear normal. Patent carotid termini. Normal MCA and ACA origins. Right ACA A1 segment is mildly dominant. Anterior communicating artery and visible ACA branches are within normal limits. MCA M1 segments and MCA bifurcations are patent. Visible bilateral MCA branches are within normal limits. IMPRESSION: 1. 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 No associated hemorrhage or mass effect. 2. No other acute intracranial abnormality. 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 Top differential considerations are accelerated small-vessel disease and demyelinating disease. 3. Negative Neck MRA. 4. Intracranial MRA is negative aside from some evidence of bilateral PCA P2 segment atherosclerosis, with only mild stenosis. Electronically Signed   By: Genevie Ann M.D.   On: 12/25/2019 17:38   MR Angiogram Neck W or Wo Contrast  Result Date: 12/25/2019 CLINICAL DATA:  54 year old male with dizziness, left lower extremity tingling which has now progressed to entire left side numbness since the MRI yesterday. EXAM: MRI HEAD WITHOUT CONTRAST MRA HEAD WITHOUT CONTRAST MRA NECK WITHOUT AND WITH CONTRAST TECHNIQUE: Multiplanar, multiecho pulse sequences of the brain and surrounding structures were obtained without contrast. Angiographic  images of the Circle of Willis were obtained using MRA technique without intravenous contrast. Angiographic images of the neck were obtained using MRA technique without and with intravenous contrast. Carotid stenosis measurements (when applicable) are obtained utilizing NASCET criteria, using the distal internal carotid diameter as the denominator. CONTRAST:  51mL GADAVIST GADOBUTROL 1 MMOL/ML IV SOLN COMPARISON:  Brain MRI and head CT 12/24/2019. FINDINGS: Study is intermittently degraded by motion artifact despite repeated imaging attempts. MRI HEAD FINDINGS Brain: Subtle restricted diffusion suspected in the lateral right thalamus now, near the posterior limb right external capsule (series 27, image 25). Faint T2 and FLAIR hyperintensity. No associated hemorrhage or mass effect. No other restricted diffusion. No midline shift, mass effect, evidence of mass lesion, ventriculomegaly, extra-axial collection or acute intracranial hemorrhage. Cervicomedullary junction and pituitary are within normal limits. Advanced bilateral cerebral white matter T2 and FLAIR hyperintensity redemonstrated in both hemispheres,, and in a nonspecific configuration. Some of these areas most resemble chronic white matter lacunar infarcts, although demyelinating disease would also be a consideration. On T2* imaging there is evidence of at least 1 chronic microhemorrhage in the right hemisphere (series 24, image 20).  No cortical encephalomalacia identified. Vascular: Major intracranial vascular flow voids appear stable since yesterday. Skull and upper cervical spine: Negative. Sinuses/Orbits: Stable, negative. Other: Mastoids remain clear. Grossly normal visible internal auditory structures. MRA NECK FINDINGS Precontrast time-of-flight neck MRA images reveal antegrade flow in both cervical carotid and vertebral arteries. Both carotid bifurcations appear patent and normal. Post-contrast neck MRA images reveal a 3 vessel arch configuration.  The proximal great vessels appear normal. The right CCA, right carotid bifurcation and cervical right ICA appear normal. The left CCA appears normal aside from mild tortuosity. The left carotid bifurcation and cervical left ICA appear normal. Both proximal subclavian arteries and vertebral artery origins are within normal limits. Mildly tortuous left V1 segment. Codominant vertebral arteries appear normal to the skull base. MRA HEAD FINDINGS Antegrade flow in the posterior circulation with codominant distal vertebral arteries. Patent PICA origins. Patent vertebrobasilar junction. No distal vertebral or basilar stenosis. Patent SCA and PCA origins. Fetal type right PCA origin. The left posterior communicating artery is diminutive or absent. Bilateral PCA branches appear patent. There is mild irregularity and stenosis in both PCA P2 segments. Antegrade flow in both ICA siphons. No siphon stenosis. Ophthalmic and right posterior communicating artery origins appear normal. Patent carotid termini. Normal MCA and ACA origins. Right ACA A1 segment is mildly dominant. Anterior communicating artery and visible ACA branches are within normal limits. MCA M1 segments and MCA bifurcations are patent. Visible bilateral MCA branches are within normal limits. IMPRESSION: 1. 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 No associated hemorrhage or mass effect. 2. No other acute intracranial abnormality. 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 Top differential considerations are accelerated small-vessel disease and demyelinating disease. 3. Negative Neck MRA. 4. Intracranial MRA is negative aside from some evidence of bilateral PCA P2 segment atherosclerosis, with only mild stenosis. Electronically Signed   By: Genevie Ann M.D.   On: 12/25/2019 17:38   MR BRAIN WO CONTRAST  Result Date:  12/25/2019 CLINICAL DATA:  54 year old male with dizziness, left lower extremity tingling which has now progressed to entire left side numbness since the MRI yesterday. EXAM: MRI HEAD WITHOUT CONTRAST MRA HEAD WITHOUT CONTRAST MRA NECK WITHOUT AND WITH CONTRAST TECHNIQUE: Multiplanar, multiecho pulse sequences of the brain and surrounding structures were obtained without contrast. Angiographic images of the Circle of Willis were obtained using MRA technique without intravenous contrast. Angiographic images of the neck were obtained using MRA technique without and with intravenous contrast. Carotid stenosis measurements (when applicable) are obtained utilizing NASCET criteria, using the distal internal carotid diameter as the denominator. CONTRAST:  57mL GADAVIST GADOBUTROL 1 MMOL/ML IV SOLN COMPARISON:  Brain MRI and head CT 12/24/2019. FINDINGS: Study is intermittently degraded by motion artifact despite repeated imaging attempts. MRI HEAD FINDINGS Brain: Subtle restricted diffusion suspected in the lateral right thalamus now, near the posterior limb right external capsule (series 27, image 25). Faint T2 and FLAIR hyperintensity. No associated hemorrhage or mass effect. No other restricted diffusion. No midline shift, mass effect, evidence of mass lesion, ventriculomegaly, extra-axial collection or acute intracranial hemorrhage. Cervicomedullary junction and pituitary are within normal limits. Advanced bilateral cerebral white matter T2 and FLAIR hyperintensity redemonstrated in both hemispheres,, and in a nonspecific configuration. Some of these areas most resemble chronic white matter lacunar infarcts, although demyelinating disease would also be a consideration. On T2* imaging there is evidence of at least 1 chronic microhemorrhage  in the right hemisphere (series 24, image 20). No cortical encephalomalacia identified. Vascular: Major intracranial vascular flow voids appear stable since yesterday. Skull and upper  cervical spine: Negative. Sinuses/Orbits: Stable, negative. Other: Mastoids remain clear. Grossly normal visible internal auditory structures. MRA NECK FINDINGS Precontrast time-of-flight neck MRA images reveal antegrade flow in both cervical carotid and vertebral arteries. Both carotid bifurcations appear patent and normal. Post-contrast neck MRA images reveal a 3 vessel arch configuration. The proximal great vessels appear normal. The right CCA, right carotid bifurcation and cervical right ICA appear normal. The left CCA appears normal aside from mild tortuosity. The left carotid bifurcation and cervical left ICA appear normal. Both proximal subclavian arteries and vertebral artery origins are within normal limits. Mildly tortuous left V1 segment. Codominant vertebral arteries appear normal to the skull base. MRA HEAD FINDINGS Antegrade flow in the posterior circulation with codominant distal vertebral arteries. Patent PICA origins. Patent vertebrobasilar junction. No distal vertebral or basilar stenosis. Patent SCA and PCA origins. Fetal type right PCA origin. The left posterior communicating artery is diminutive or absent. Bilateral PCA branches appear patent. There is mild irregularity and stenosis in both PCA P2 segments. Antegrade flow in both ICA siphons. No siphon stenosis. Ophthalmic and right posterior communicating artery origins appear normal. Patent carotid termini. Normal MCA and ACA origins. Right ACA A1 segment is mildly dominant. Anterior communicating artery and visible ACA branches are within normal limits. MCA M1 segments and MCA bifurcations are patent. Visible bilateral MCA branches are within normal limits. IMPRESSION: 1. 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 No associated hemorrhage or mass effect. 2. No other acute intracranial abnormality. Advanced but nonspecific cerebral white  matter disease re-demonstrated. Top differential considerations are accelerated small-vessel disease and demyelinating disease. 3. Negative Neck MRA. 4. Intracranial MRA is negative aside from some evidence of bilateral PCA P2 segment atherosclerosis, with only mild stenosis. Electronically Signed   By: Genevie Ann M.D.   On: 12/25/2019 17:38   MR BRAIN WO CONTRAST  Result Date: 12/24/2019 CLINICAL DATA:  Dizziness.  Left lower extremity tingling EXAM: MRI HEAD WITHOUT CONTRAST TECHNIQUE: Multiplanar, multiecho pulse sequences of the brain and surrounding structures were obtained without intravenous contrast. COMPARISON:  None. FINDINGS: BRAIN: No acute infarct, acute hemorrhage or extra-axial collection. Multifocal white matter hyperintensity, most commonly due to chronic ischemic microangiopathy. Normal volume of CSF spaces. No chronic microhemorrhage. Normal midline structures. VASCULAR: Major flow voids are preserved. SKULL AND UPPER CERVICAL SPINE: Normal calvarium and skull base. Visualized upper cervical spine and soft tissues are normal. SINUSES/ORBITS: No paranasal sinus fluid levels or advanced mucosal thickening. No mastoid or middle ear effusion. Normal orbits. IMPRESSION: 1. No acute intracranial abnormality. 2. Multifocal white matter hyperintensity, most commonly due to chronic ischemic microangiopathy. Electronically Signed   By: Ulyses Jarred M.D.   On: 12/24/2019 23:10    EKG: Independently reviewed. Interpretation : Normal sinus rhythm with no acute ST-T wave changes  Assessment/Plan 54 year old male with a history of atrial fibrillation status post ablation in 2017 no longer on Xarelto or dofetilide, presenting with greater than 24-hour onset left-sided numbness associated with dizziness and lightheadedness.  MRI with small acute thalamic infarct  Acute thalamic infarction [facility]) -Patient presented twice to the emergency room with symptoms of left-sided numbness and dizziness.  First MRI  on 8/17 showed no acute findings -MRI/MRA on 8/18 showed small thalamic infarct -Stroke work-up to  include echo, carotid Doppler and cardiac monitoring -PT speech and OT consults -Aspirin and statin    S/P ablation of atrial fibrillation -Underwent ablation in 2017.  Was previously on Xarelto and dofetilide -EKG continues to show sinus rhythm    DVT prophylaxis: Lovenox  Code Status: full code  Family Communication:  none  Disposition Plan: Back to previous home environment Consults called: Neurology status: Observation    Athena Masse MD [facility]     12/25/2019, 9:33 PM

## 2019-12-25 NOTE — Progress Notes (Signed)
Neurology:  54 y.o. male with a past medical history of migraines, presents emergency department for intermittent dizziness and tingling of his left leg.  According to the patient over the past 2 days or so he has been feeling intermittent episodes of dizziness/lightheadedness.  States they mostly occur upon standing.  He states also yesterday at times he was feeling some mild tingling in his left leg.  Patient states he is also been experiencing a migraine for the past 2 days but is not atypical for him.    - Currently subjective Numbness on LUE and LLE no other focality - MRI/MRA pending - compazine and decadron for the headache - if MRI and MRA head normal and symptoms improve post headache medications, pt can likely be d/c today  Carlos Nolan

## 2019-12-25 NOTE — ED Notes (Signed)
Reports decreased sensation and weakness to left arm, left hand and left foot X 2 days, worsening this AM. States weakness has subsided and that the sensation lost it mild. No obvious stroke like sx, ambulates easily.

## 2019-12-25 NOTE — ED Provider Notes (Signed)
-----------------------------------------   6:04 PM on 12/25/2019 -----------------------------------------  I took over care on this patient from Dr. Cinda Quest.  The patient was pending an MRI to evaluate for dizziness and left-sided numbness.  He required sedation with Ativan to tolerate the study.  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  On reassessment, the patient is still quite somnolent from the Ativan.  He will need admission for stroke work-up.  I will reassess once he is more awake so that I can discuss the results and plan of care with him.  ----------------------------------------- 9:25 PM on 12/25/2019 -----------------------------------------  The patient is alert and oriented.  I discussed the results of the work-up with him.  He reports that the numbness is slightly better but is still present.  I discussed the case with Dr. Damita Dunnings from the hospitalist service for admission.   Arta Silence, MD 12/25/19 [id number]

## 2019-12-25 NOTE — ED Triage Notes (Signed)
Pt reports was here last pm for dizziness and was seen and discharged but this am he awoke and had numbness on the left side of his body. Pt reports he woke up about 15 minutes ago. Pt denies HA, reports some discomfort in his chest. Pt ambulatory able to move all extremities.

## 2019-12-25 NOTE — ED Notes (Signed)
Pt transported to MRI. O2 sats 98% at this time.

## 2019-12-26 ENCOUNTER — Observation Stay: Payer: 59

## 2019-12-26 ENCOUNTER — Inpatient Hospital Stay (HOSPITAL_COMMUNITY)
Admit: 2019-12-26 | Discharge: 2019-12-26 | Disposition: A | Discharge status: Home / Self Care | Payer: 59 | Attending: Internal Medicine | Admitting: Internal Medicine

## 2019-12-26 ENCOUNTER — Encounter: Payer: Self-pay | Admitting: Internal Medicine

## 2019-12-26 DIAGNOSIS — I361 Nonrheumatic tricuspid (valve) insufficiency: Secondary | ICD-10-CM | POA: Diagnosis not present

## 2019-12-26 DIAGNOSIS — G43909 Migraine, unspecified, not intractable, without status migrainosus: Secondary | ICD-10-CM | POA: Diagnosis present

## 2019-12-26 DIAGNOSIS — Z79899 Other long term (current) drug therapy: Secondary | ICD-10-CM | POA: Diagnosis not present

## 2019-12-26 DIAGNOSIS — I10 Essential (primary) hypertension: Secondary | ICD-10-CM | POA: Diagnosis not present

## 2019-12-26 DIAGNOSIS — Z79891 Long term (current) use of opiate analgesic: Secondary | ICD-10-CM | POA: Diagnosis not present

## 2019-12-26 DIAGNOSIS — I4891 Unspecified atrial fibrillation: Secondary | ICD-10-CM | POA: Diagnosis present

## 2019-12-26 DIAGNOSIS — Z880 Allergy status to penicillin: Secondary | ICD-10-CM | POA: Diagnosis not present

## 2019-12-26 DIAGNOSIS — Z20822 Contact with and (suspected) exposure to covid-19: Secondary | ICD-10-CM | POA: Diagnosis present

## 2019-12-26 DIAGNOSIS — Z91018 Allergy to other foods: Secondary | ICD-10-CM | POA: Diagnosis not present

## 2019-12-26 DIAGNOSIS — I639 Cerebral infarction, unspecified: Secondary | ICD-10-CM | POA: Diagnosis present

## 2019-12-26 DIAGNOSIS — Z888 Allergy status to other drugs, medicaments and biological substances status: Secondary | ICD-10-CM | POA: Diagnosis not present

## 2019-12-26 DIAGNOSIS — R42 Dizziness and giddiness: Secondary | ICD-10-CM | POA: Diagnosis not present

## 2019-12-26 LAB — CBC
HCT: 43.3 % (ref 39.0–52.0)
Hemoglobin: 15.4 g/dL (ref 13.0–17.0)
MCH: 31.4 pg (ref 26.0–34.0)
MCHC: 35.6 g/dL (ref 30.0–36.0)
MCV: 88.4 fL (ref 80.0–100.0)
Platelets: 183 10*3/uL (ref 150–400)
RBC: 4.9 MIL/uL (ref 4.22–5.81)
RDW: 11.9 % (ref 11.5–15.5)
WBC: 12.7 10*3/uL — ABNORMAL HIGH (ref 4.0–10.5)
nRBC: 0 % (ref 0.0–0.2)

## 2019-12-26 LAB — TSH: TSH: 2.557 u[IU]/mL (ref 0.350–4.500)

## 2019-12-26 LAB — ECHOCARDIOGRAM COMPLETE
AR max vel: 2.84 cm2
AV Area VTI: 3.51 cm2
AV Area mean vel: 2.9 cm2
AV Mean grad: 4.5 mmHg
AV Peak grad: 7.5 mmHg
Ao pk vel: 1.37 m/s
Area-P 1/2: 4.17 cm2
Height: 73 in
S' Lateral: 3.14 cm
Weight: 3840 oz

## 2019-12-26 LAB — HEMOGLOBIN A1C
Hgb A1c MFr Bld: 5.8 % — ABNORMAL HIGH (ref 4.8–5.6)
Mean Plasma Glucose: 119.76 mg/dL

## 2019-12-26 LAB — BASIC METABOLIC PANEL
Anion gap: 13 (ref 5–15)
BUN: 19 mg/dL (ref 6–20)
CO2: 22 mmol/L (ref 22–32)
Calcium: 9.4 mg/dL (ref 8.9–10.3)
Chloride: 105 mmol/L (ref 98–111)
Creatinine, Ser: 0.95 mg/dL (ref 0.61–1.24)
GFR calc Af Amer: >60 mL/min (ref >60)
GFR calc non Af Amer: >60 mL/min (ref >60)
Glucose, Bld: 108 mg/dL — ABNORMAL HIGH (ref 70–99)
Potassium: 3.8 mmol/L (ref 3.5–5.1)
Sodium: 140 mmol/L (ref 135–145)

## 2019-12-26 LAB — LIPID PANEL
Cholesterol: 198 mg/dL (ref 0–200)
HDL: 37 mg/dL — ABNORMAL LOW (ref >40)
LDL Cholesterol: 145 mg/dL — ABNORMAL HIGH (ref 0–99)
Total CHOL/HDL Ratio: 5.4 RATIO
Triglycerides: 78 mg/dL (ref <150)
VLDL: 16 mg/dL (ref 0–40)

## 2019-12-26 LAB — MAGNESIUM: Magnesium: 1.9 mg/dL (ref 1.7–2.4)

## 2019-12-26 LAB — HIV ANTIBODY (ROUTINE TESTING W REFLEX): HIV Screen 4th Generation wRfx: NONREACTIVE

## 2019-12-26 MED ORDER — ASPIRIN 325 MG PO TBEC
325.0000 mg | DELAYED_RELEASE_TABLET | Freq: Every day | ORAL | 0 refills | Status: DC
Start: 2019-12-27 — End: 2022-03-12

## 2019-12-26 MED ORDER — ATORVASTATIN CALCIUM 40 MG PO TABS: 40.0000 mg | ORAL_TABLET | Freq: Every day | ORAL | 2 refills | Status: AC

## 2019-12-26 MED ORDER — STROKE: EARLY STAGES OF RECOVERY BOOK: 1.0000 | Freq: Once | 0 refills | Status: AC

## 2019-12-26 NOTE — ED Notes (Signed)
Pt walked to restroom w assistance. Unsteady gait due to numbness/weakness in L leg.

## 2019-12-26 NOTE — Progress Notes (Signed)
*  PRELIMINARY RESULTS* Echocardiogram 2D Echocardiogram has been performed.  Sherrie Sport 12/26/2019, 2:33 PM

## 2019-12-26 NOTE — Progress Notes (Signed)
Subjective: Pt is weaker today on LUE and LLE compared to yesterday. He is having pain in LUE.   Objective: Current vital signs: BP 135/89 (BP Location: Right Arm)   Pulse 74   Temp 98 F (36.7 C) (Oral)   Resp 20   Ht 6\' 1"  (1.854 m)   Wt 108.9 kg   SpO2 97%   BMI 31.66 kg/m  Vital signs in last 24 hours: Temp:  [98 F (36.7 C)] 98 F (36.7 C) (08/18 1216) Pulse Rate:  [70-91] 74 (08/19 1103) Resp:  [14-20] 20 (08/19 1103) BP: (116-157)/(62-97) 135/89 (08/19 1103) SpO2:  [95 %-98 %] 97 % (08/19 1103)  Intake/Output from previous day: No intake/output data recorded. Intake/Output this shift: No intake/output data recorded. Nutritional status:  Diet Order            Diet NPO time specified  Diet effective now                 Neurologic Exam:  Neurological Examination   Mental Status: Alert, oriented to date and time Cranial Nerves: II: Discs flat bilaterally; Visual fields grossly normal, pupils equal, round, reactive to light and accommodation III,IV, VI: ptosis not present, extra-ocular motions intact bilaterally V,VII: smile symmetric, facial light touch sensation normal bilaterally VIII: hearing normal bilaterally IX,X: gag reflex present XI: bilateral shoulder shrug XII: midline tongue extension Motor: Right : Upper extremity   5/5    Left:     Upper extremity   4/5  Lower extremity   5/5     Lower extremity   4/5 Tone and bulk:normal tone throughout; no atrophy noted Sensory: Pain and decreased sensation on L side.   Deep Tendon Reflexes: 1+ and symmetric throughout Plantars: Right: downgoing   Left: downgoing Cerebellar: normal finger-to-nose      Lab Results: Results for orders placed or performed during the hospital encounter of 12/25/19 (from the past 48 hour(s))  Comprehensive metabolic panel     Status: Abnormal   Collection Time: 12/25/19  7:25 AM  Result Value Ref Range   Sodium 133 (L) 135 - 145 mmol/L   Potassium 3.5 3.5 - 5.1  mmol/L   Chloride 99 98 - 111 mmol/L   CO2 24 22 - 32 mmol/L   Glucose, Bld 120 (H) 70 - 99 mg/dL    Comment: Glucose reference range applies only to samples taken after fasting for at least 8 hours.   BUN 19 6 - 20 mg/dL   Creatinine, Ser 1.06 0.61 - 1.24 mg/dL   Calcium 8.9 8.9 - 10.3 mg/dL   Total Protein 7.6 6.5 - 8.1 g/dL   Albumin 4.2 3.5 - 5.0 g/dL   AST 20 15 - 41 U/L   ALT 26 0 - 44 U/L   Alkaline Phosphatase 101 38 - 126 U/L   Total Bilirubin 1.1 0.3 - 1.2 mg/dL   GFR calc non Af Amer >60 >60 mL/min   GFR calc Af Amer >60 >60 mL/min   Anion gap 10 5 - 15    Comment: Performed at [facility], [address] [place], [place] [address]  CBC with Differential     Status: None   Collection Time: 12/25/19  7:25 AM  Result Value Ref Range   WBC 6.7 4.0 - 10.5 K/uL   RBC 4.82 4.22 - 5.81 MIL/uL   Hemoglobin 15.4 13.0 - 17.0 g/dL   HCT 42.9 39 - 52 %   MCV 89.0 80.0 - 100.0 fL  MCH 32.0 26.0 - 34.0 pg   MCHC 35.9 30.0 - 36.0 g/dL   RDW 12.0 11.5 - 15.5 %   Platelets 172 150 - 400 K/uL   nRBC 0.0 0.0 - 0.2 %   Neutrophils Relative % 61 %   Neutro Abs 4.1 1.7 - 7.7 K/uL   Lymphocytes Relative 22 %   Lymphs Abs 1.5 0.7 - 4.0 K/uL   Monocytes Relative 9 %   Monocytes Absolute 0.6 0 - 1 K/uL   Eosinophils Relative 7 %   Eosinophils Absolute 0.5 0 - 0 K/uL   Basophils Relative 1 %   Basophils Absolute 0.1 0 - 0 K/uL   Immature Granulocytes 0 %   Abs Immature Granulocytes 0.02 0.00 - 0.07 K/uL    Comment: Performed at [facility], [place], [place] [address]  Troponin I (High Sensitivity)     Status: None   Collection Time: 12/25/19  7:25 AM  Result Value Ref Range   Troponin I (High Sensitivity) 5 <18 ng/L    Comment: (NOTE) Elevated high sensitivity troponin I (hsTnI) values and significant  changes across serial measurements may suggest ACS but many other  chronic and acute conditions are known to elevate hsTnI results.  Refer to  the "Links" section for chest pain algorithms and additional  guidance. Performed at [facility], [address] [place], [place] [address]   Sedimentation rate     Status: None   Collection Time: 12/25/19  7:25 AM  Result Value Ref Range   Sed Rate 15 0 - 20 mm/hr    Comment: Performed at [facility], [place], [place] [address]  Troponin I (High Sensitivity)     Status: None   Collection Time: 12/25/19 11:17 AM  Result Value Ref Range   Troponin I (High Sensitivity) 4 <18 ng/L    Comment: (NOTE) Elevated high sensitivity troponin I (hsTnI) values and significant  changes across serial measurements may suggest ACS but many other  chronic and acute conditions are known to elevate hsTnI results.  Refer to the "Links" section for chest pain algorithms and additional  guidance. Performed at [facility], [address] [place], [place] [address]   Urinalysis, Complete w Microscopic Urine, Clean Catch     Status: Abnormal   Collection Time: 12/25/19  2:19 PM  Result Value Ref Range   Color, Urine YELLOW (A) YELLOW   APPearance CLEAR (A) CLEAR   Specific Gravity, Urine 1.017 1.005 - 1.030   pH 5.0 5.0 - 8.0   Glucose, UA NEGATIVE NEGATIVE mg/dL   Hgb urine dipstick NEGATIVE NEGATIVE   Bilirubin Urine NEGATIVE NEGATIVE   Ketones, ur NEGATIVE NEGATIVE mg/dL   Protein, ur NEGATIVE NEGATIVE mg/dL   Nitrite NEGATIVE NEGATIVE   Leukocytes,Ua NEGATIVE NEGATIVE   WBC, UA NONE SEEN 0 - 5 WBC/hpf   Bacteria, UA NONE SEEN NONE SEEN   Squamous Epithelial / LPF 0-5 0 - 5   Mucus PRESENT     Comment: Performed at [facility], [address] [place], [place] [address]  Urine Drug Screen, Qualitative     Status: Abnormal   Collection Time: 12/25/19  2:19 PM  Result Value Ref Range   Tricyclic, Ur Screen NONE DETECTED NONE DETECTED   Amphetamines, Ur Screen NONE DETECTED NONE DETECTED   MDMA (Ecstasy)Ur Screen NONE DETECTED NONE  DETECTED   Cocaine Metabolite,Ur [place] NONE DETECTED NONE DETECTED   Opiate, Ur Screen  NONE DETECTED NONE DETECTED   Phencyclidine (PCP) Ur S NONE DETECTED NONE DETECTED   Cannabinoid 50 Ng, Ur [place] NONE DETECTED NONE DETECTED   Barbiturates, Ur Screen POSITIVE (A) NONE DETECTED   Benzodiazepine, Ur Scrn NONE DETECTED NONE DETECTED   Methadone Scn, Ur NONE DETECTED NONE DETECTED    Comment: (NOTE) Tricyclics + metabolites, urine    Cutoff 1000 ng/mL Amphetamines + metabolites, urine  Cutoff 1000 ng/mL MDMA (Ecstasy), urine              Cutoff 500 ng/mL Cocaine Metabolite, urine          Cutoff 300 ng/mL Opiate + metabolites, urine        Cutoff 300 ng/mL Phencyclidine (PCP), urine         Cutoff 25 ng/mL Cannabinoid, urine                 Cutoff 50 ng/mL Barbiturates + metabolites, urine  Cutoff 200 ng/mL Benzodiazepine, urine              Cutoff 200 ng/mL Methadone, urine                   Cutoff 300 ng/mL  The urine drug screen provides only a preliminary, unconfirmed analytical test result and should not be used for non-medical purposes. Clinical consideration and professional judgment should be applied to any positive drug screen result due to possible interfering substances. A more specific alternate chemical method must be used in order to obtain a confirmed analytical result. Gas chromatography / mass spectrometry (GC/MS) is the preferred confirm atory method. Performed at [facility], [address] [place], [place] [address]   SARS Coronavirus 2 by RT PCR (hospital order, performed in [facility] hospital lab) Nasopharyngeal Nasopharyngeal Swab     Status: None   Collection Time: 12/25/19  6:03 PM   Specimen: Nasopharyngeal Swab  Result Value Ref Range   SARS Coronavirus 2 NEGATIVE NEGATIVE    Comment: (NOTE) SARS-CoV-2 target nucleic acids are NOT DETECTED.  The SARS-CoV-2 RNA is generally detectable in upper and lower respiratory specimens during the acute  phase of infection. The lowest concentration of SARS-CoV-2 viral copies this assay can detect is 250 copies / mL. A negative result does not preclude SARS-CoV-2 infection and should not be used as the sole basis for treatment or other patient management decisions.  A negative result may occur with improper specimen collection / handling, submission of specimen other than nasopharyngeal swab, presence of viral mutation(s) within the areas targeted by this assay, and inadequate number of viral copies (<250 copies / mL). A negative result must be combined with clinical observations, patient history, and epidemiological information.  Fact Sheet for Patients:   StrictlyIdeas.no  Fact Sheet for Healthcare Providers: BankingDealers.co.za  This test is not yet approved or  cleared by the [place] FDA and has been authorized for detection and/or diagnosis of SARS-CoV-2 by FDA under an Emergency Use Authorization (EUA).  This EUA will remain in effect (meaning this test can be used) for the duration of the COVID-19 declaration under Section 564(b)(1) of the Act, 21 U.S.C. section 360bbb-3(b)(1), unless the authorization is terminated or revoked sooner.  Performed at [facility], [address] [place], [place] [address]   CBC     Status: Abnormal   Collection Time: 12/26/19 10:55 AM  Result Value Ref Range   WBC 12.7 (H) 4.0 - 10.5 K/uL   RBC 4.90 4.22 - 5.81  MIL/uL   Hemoglobin 15.4 13.0 - 17.0 g/dL   HCT 43.3 39 - 52 %   MCV 88.4 80.0 - 100.0 fL   MCH 31.4 26.0 - 34.0 pg   MCHC 35.6 30.0 - 36.0 g/dL   RDW 11.9 11.5 - 15.5 %   Platelets 183 150 - 400 K/uL   nRBC 0.0 0.0 - 0.2 %    Comment: Performed at [facility], [address], [place], [place] [address]    Recent Results (from the past 240 hour(s))  SARS Coronavirus 2 by RT PCR (hospital order, performed in [facility] hospital lab) Nasopharyngeal  Nasopharyngeal Swab     Status: None   Collection Time: 12/25/19  6:03 PM   Specimen: Nasopharyngeal Swab  Result Value Ref Range Status   SARS Coronavirus 2 NEGATIVE NEGATIVE Final    Comment: (NOTE) SARS-CoV-2 target nucleic acids are NOT DETECTED.  The SARS-CoV-2 RNA is generally detectable in upper and lower respiratory specimens during the acute phase of infection. The lowest concentration of SARS-CoV-2 viral copies this assay can detect is 250 copies / mL. A negative result does not preclude SARS-CoV-2 infection and should not be used as the sole basis for treatment or other patient management decisions.  A negative result may occur with improper specimen collection / handling, submission of specimen other than nasopharyngeal swab, presence of viral mutation(s) within the areas targeted by this assay, and inadequate number of viral copies (<250 copies / mL). A negative result must be combined with clinical observations, patient history, and epidemiological information.  Fact Sheet for Patients:   StrictlyIdeas.no  Fact Sheet for Healthcare Providers: BankingDealers.co.za  This test is not yet approved or  cleared by the [place] FDA and has been authorized for detection and/or diagnosis of SARS-CoV-2 by FDA under an Emergency Use Authorization (EUA).  This EUA will remain in effect (meaning this test can be used) for the duration of the COVID-19 declaration under Section 564(b)(1) of the Act, 21 U.S.C. section 360bbb-3(b)(1), unless the authorization is terminated or revoked sooner.  Performed at [facility], [address] [place], [place] [address]     Lipid Panel No results for input(s): CHOL, TRIG, HDL, CHOLHDL, VLDL, LDLCALC in the last 72 hours.  Studies/Results: DG Chest 2 View  Result Date: 12/25/2019 CLINICAL DATA:  Chest pain. Additional provided: Left sided numbness. EXAM: CHEST - 2 VIEW  COMPARISON:  Prior chest radiographs 05/30/2013 and earlier FINDINGS: Heart size within normal limits. No appreciable airspace consolidation or pulmonary edema. No evidence of pleural effusion or pneumothorax. No acute bony abnormality identified. IMPRESSION: No evidence of active cardiopulmonary disease. Electronically Signed   By: Kellie Simmering DO   On: 12/25/2019 08:09   CT Head Wo Contrast  Result Date: 12/24/2019 CLINICAL DATA:  Dizziness with standing since yesterday, lightheadedness, intermittent left-sided weakness EXAM: CT HEAD WITHOUT CONTRAST TECHNIQUE: Contiguous axial images were obtained from the base of the skull through the vertex without intravenous contrast. COMPARISON:  None. FINDINGS: Brain: Hypodensities are seen within the bilateral parietal periventricular white matter, as well as a focal hypodensity within the right frontal subcortical white matter. These findings are consistent with age indeterminate ischemic change. No other signs of acute infarct or hemorrhage. Lateral ventricles and midline structures are unremarkable. No acute extra-axial fluid collections. No mass effect. Vascular: No hyperdense vessel or unexpected calcification. Skull: Normal. Negative for fracture or focal lesion. Sinuses/Orbits: No acute finding. Other: None. IMPRESSION: 1. Age-indeterminate small vessel ischemic  changes within the right frontal subcortical white matter and bilateral parietal periventricular white matter. 2. No acute hemorrhage. Electronically Signed   By: Randa Ngo M.D.   On: 12/24/2019 21:04   MR ANGIO HEAD WO CONTRAST  Result Date: 12/25/2019 CLINICAL DATA:  53 year old male with dizziness, left lower extremity tingling which has now progressed to entire left side numbness since the MRI yesterday. EXAM: MRI HEAD WITHOUT CONTRAST MRA HEAD WITHOUT CONTRAST MRA NECK WITHOUT AND WITH CONTRAST TECHNIQUE: Multiplanar, multiecho pulse sequences of the brain and surrounding structures were  obtained without contrast. Angiographic images of the Circle of Willis were obtained using MRA technique without intravenous contrast. Angiographic images of the neck were obtained using MRA technique without and with intravenous contrast. Carotid stenosis measurements (when applicable) are obtained utilizing NASCET criteria, using the distal internal carotid diameter as the denominator. CONTRAST:  56mL GADAVIST GADOBUTROL 1 MMOL/ML IV SOLN COMPARISON:  Brain MRI and head CT 12/24/2019. FINDINGS: Study is intermittently degraded by motion artifact despite repeated imaging attempts. MRI HEAD FINDINGS Brain: Subtle restricted diffusion suspected in the lateral right thalamus now, near the posterior limb right external capsule (series 27, image 25). Faint T2 and FLAIR hyperintensity. No associated hemorrhage or mass effect. No other restricted diffusion. No midline shift, mass effect, evidence of mass lesion, ventriculomegaly, extra-axial collection or acute intracranial hemorrhage. Cervicomedullary junction and pituitary are within normal limits. Advanced bilateral cerebral white matter T2 and FLAIR hyperintensity redemonstrated in both hemispheres,, and in a nonspecific configuration. Some of these areas most resemble chronic white matter lacunar infarcts, although demyelinating disease would also be a consideration. On T2* imaging there is evidence of at least 1 chronic microhemorrhage in the right hemisphere (series 24, image 20). No cortical encephalomalacia identified. Vascular: Major intracranial vascular flow voids appear stable since yesterday. Skull and upper cervical spine: Negative. Sinuses/Orbits: Stable, negative. Other: Mastoids remain clear. Grossly normal visible internal auditory structures. MRA NECK FINDINGS Precontrast time-of-flight neck MRA images reveal antegrade flow in both cervical carotid and vertebral arteries. Both carotid bifurcations appear patent and normal. Post-contrast neck MRA images  reveal a 3 vessel arch configuration. The proximal great vessels appear normal. The right CCA, right carotid bifurcation and cervical right ICA appear normal. The left CCA appears normal aside from mild tortuosity. The left carotid bifurcation and cervical left ICA appear normal. Both proximal subclavian arteries and vertebral artery origins are within normal limits. Mildly tortuous left V1 segment. Codominant vertebral arteries appear normal to the skull base. MRA HEAD FINDINGS Antegrade flow in the posterior circulation with codominant distal vertebral arteries. Patent PICA origins. Patent vertebrobasilar junction. No distal vertebral or basilar stenosis. Patent SCA and PCA origins. Fetal type right PCA origin. The left posterior communicating artery is diminutive or absent. Bilateral PCA branches appear patent. There is mild irregularity and stenosis in both PCA P2 segments. Antegrade flow in both ICA siphons. No siphon stenosis. Ophthalmic and right posterior communicating artery origins appear normal. Patent carotid termini. Normal MCA and ACA origins. Right ACA A1 segment is mildly dominant. Anterior communicating artery and visible ACA branches are within normal limits. MCA M1 segments and MCA bifurcations are patent. Visible bilateral MCA branches are within normal limits. IMPRESSION: 1. 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 No associated hemorrhage or mass effect. 2. No other acute intracranial abnormality. 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 Top differential considerations are accelerated small-vessel  disease and demyelinating disease. 3. Negative Neck MRA. 4. Intracranial MRA is negative aside from some evidence of bilateral PCA P2 segment atherosclerosis, with only mild stenosis. Electronically Signed   By: Genevie Ann M.D.   On: 12/25/2019 17:38   MR Angiogram  Neck W or Wo Contrast  Result Date: 12/25/2019 CLINICAL DATA:  54 year old male with dizziness, left lower extremity tingling which has now progressed to entire left side numbness since the MRI yesterday. EXAM: MRI HEAD WITHOUT CONTRAST MRA HEAD WITHOUT CONTRAST MRA NECK WITHOUT AND WITH CONTRAST TECHNIQUE: Multiplanar, multiecho pulse sequences of the brain and surrounding structures were obtained without contrast. Angiographic images of the Circle of Willis were obtained using MRA technique without intravenous contrast. Angiographic images of the neck were obtained using MRA technique without and with intravenous contrast. Carotid stenosis measurements (when applicable) are obtained utilizing NASCET criteria, using the distal internal carotid diameter as the denominator. CONTRAST:  44mL GADAVIST GADOBUTROL 1 MMOL/ML IV SOLN COMPARISON:  Brain MRI and head CT 12/24/2019. FINDINGS: Study is intermittently degraded by motion artifact despite repeated imaging attempts. MRI HEAD FINDINGS Brain: Subtle restricted diffusion suspected in the lateral right thalamus now, near the posterior limb right external capsule (series 27, image 25). Faint T2 and FLAIR hyperintensity. No associated hemorrhage or mass effect. No other restricted diffusion. No midline shift, mass effect, evidence of mass lesion, ventriculomegaly, extra-axial collection or acute intracranial hemorrhage. Cervicomedullary junction and pituitary are within normal limits. Advanced bilateral cerebral white matter T2 and FLAIR hyperintensity redemonstrated in both hemispheres,, and in a nonspecific configuration. Some of these areas most resemble chronic white matter lacunar infarcts, although demyelinating disease would also be a consideration. On T2* imaging there is evidence of at least 1 chronic microhemorrhage in the right hemisphere (series 24, image 20). No cortical encephalomalacia identified. Vascular: Major intracranial vascular flow voids appear  stable since yesterday. Skull and upper cervical spine: Negative. Sinuses/Orbits: Stable, negative. Other: Mastoids remain clear. Grossly normal visible internal auditory structures. MRA NECK FINDINGS Precontrast time-of-flight neck MRA images reveal antegrade flow in both cervical carotid and vertebral arteries. Both carotid bifurcations appear patent and normal. Post-contrast neck MRA images reveal a 3 vessel arch configuration. The proximal great vessels appear normal. The right CCA, right carotid bifurcation and cervical right ICA appear normal. The left CCA appears normal aside from mild tortuosity. The left carotid bifurcation and cervical left ICA appear normal. Both proximal subclavian arteries and vertebral artery origins are within normal limits. Mildly tortuous left V1 segment. Codominant vertebral arteries appear normal to the skull base. MRA HEAD FINDINGS Antegrade flow in the posterior circulation with codominant distal vertebral arteries. Patent PICA origins. Patent vertebrobasilar junction. No distal vertebral or basilar stenosis. Patent SCA and PCA origins. Fetal type right PCA origin. The left posterior communicating artery is diminutive or absent. Bilateral PCA branches appear patent. There is mild irregularity and stenosis in both PCA P2 segments. Antegrade flow in both ICA siphons. No siphon stenosis. Ophthalmic and right posterior communicating artery origins appear normal. Patent carotid termini. Normal MCA and ACA origins. Right ACA A1 segment is mildly dominant. Anterior communicating artery and visible ACA branches are within normal limits. MCA M1 segments and MCA bifurcations are patent. Visible bilateral MCA branches are within normal limits. IMPRESSION: 1. 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 No associated hemorrhage or mass effect. 2. No other acute intracranial abnormality.  Advanced but  nonspecific cerebral white matter disease re-demonstrated. Top differential considerations are accelerated small-vessel disease and demyelinating disease. 3. Negative Neck MRA. 4. Intracranial MRA is negative aside from some evidence of bilateral PCA P2 segment atherosclerosis, with only mild stenosis. Electronically Signed   By: Genevie Ann M.D.   On: 12/25/2019 17:38   MR BRAIN WO CONTRAST  Result Date: 12/25/2019 CLINICAL DATA:  54 year old male with dizziness, left lower extremity tingling which has now progressed to entire left side numbness since the MRI yesterday. EXAM: MRI HEAD WITHOUT CONTRAST MRA HEAD WITHOUT CONTRAST MRA NECK WITHOUT AND WITH CONTRAST TECHNIQUE: Multiplanar, multiecho pulse sequences of the brain and surrounding structures were obtained without contrast. Angiographic images of the Circle of Willis were obtained using MRA technique without intravenous contrast. Angiographic images of the neck were obtained using MRA technique without and with intravenous contrast. Carotid stenosis measurements (when applicable) are obtained utilizing NASCET criteria, using the distal internal carotid diameter as the denominator. CONTRAST:  41mL GADAVIST GADOBUTROL 1 MMOL/ML IV SOLN COMPARISON:  Brain MRI and head CT 12/24/2019. FINDINGS: Study is intermittently degraded by motion artifact despite repeated imaging attempts. MRI HEAD FINDINGS Brain: Subtle restricted diffusion suspected in the lateral right thalamus now, near the posterior limb right external capsule (series 27, image 25). Faint T2 and FLAIR hyperintensity. No associated hemorrhage or mass effect. No other restricted diffusion. No midline shift, mass effect, evidence of mass lesion, ventriculomegaly, extra-axial collection or acute intracranial hemorrhage. Cervicomedullary junction and pituitary are within normal limits. Advanced bilateral cerebral white matter T2 and FLAIR hyperintensity redemonstrated in both hemispheres,, and  in a nonspecific configuration. Some of these areas most resemble chronic white matter lacunar infarcts, although demyelinating disease would also be a consideration. On T2* imaging there is evidence of at least 1 chronic microhemorrhage in the right hemisphere (series 24, image 20). No cortical encephalomalacia identified. Vascular: Major intracranial vascular flow voids appear stable since yesterday. Skull and upper cervical spine: Negative. Sinuses/Orbits: Stable, negative. Other: Mastoids remain clear. Grossly normal visible internal auditory structures. MRA NECK FINDINGS Precontrast time-of-flight neck MRA images reveal antegrade flow in both cervical carotid and vertebral arteries. Both carotid bifurcations appear patent and normal. Post-contrast neck MRA images reveal a 3 vessel arch configuration. The proximal great vessels appear normal. The right CCA, right carotid bifurcation and cervical right ICA appear normal. The left CCA appears normal aside from mild tortuosity. The left carotid bifurcation and cervical left ICA appear normal. Both proximal subclavian arteries and vertebral artery origins are within normal limits. Mildly tortuous left V1 segment. Codominant vertebral arteries appear normal to the skull base. MRA HEAD FINDINGS Antegrade flow in the posterior circulation with codominant distal vertebral arteries. Patent PICA origins. Patent vertebrobasilar junction. No distal vertebral or basilar stenosis. Patent SCA and PCA origins. Fetal type right PCA origin. The left posterior communicating artery is diminutive or absent. Bilateral PCA branches appear patent. There is mild irregularity and stenosis in both PCA P2 segments. Antegrade flow in both ICA siphons. No siphon stenosis. Ophthalmic and right posterior communicating artery origins appear normal. Patent carotid termini. Normal MCA and ACA origins. Right ACA A1 segment is mildly dominant. Anterior communicating artery and visible ACA branches  are within normal limits. MCA M1 segments and MCA bifurcations are patent. Visible bilateral MCA branches are within normal limits. IMPRESSION: 1. 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 No associated hemorrhage or mass effect.  2. No other acute intracranial abnormality. 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 Top differential considerations are accelerated small-vessel disease and demyelinating disease. 3. Negative Neck MRA. 4. Intracranial MRA is negative aside from some evidence of bilateral PCA P2 segment atherosclerosis, with only mild stenosis. Electronically Signed   By: Genevie Ann M.D.   On: 12/25/2019 17:38   MR BRAIN WO CONTRAST  Result Date: 12/24/2019 CLINICAL DATA:  Dizziness.  Left lower extremity tingling EXAM: MRI HEAD WITHOUT CONTRAST TECHNIQUE: Multiplanar, multiecho pulse sequences of the brain and surrounding structures were obtained without intravenous contrast. COMPARISON:  None. FINDINGS: BRAIN: No acute infarct, acute hemorrhage or extra-axial collection. Multifocal white matter hyperintensity, most commonly due to chronic ischemic microangiopathy. Normal volume of CSF spaces. No chronic microhemorrhage. Normal midline structures. VASCULAR: Major flow voids are preserved. SKULL AND UPPER CERVICAL SPINE: Normal calvarium and skull base. Visualized upper cervical spine and soft tissues are normal. SINUSES/ORBITS: No paranasal sinus fluid levels or advanced mucosal thickening. No mastoid or middle ear effusion. Normal orbits. IMPRESSION: 1. No acute intracranial abnormality. 2. Multifocal white matter hyperintensity, most commonly due to chronic ischemic microangiopathy. Electronically Signed   By: Ulyses Jarred M.D.   On: 12/24/2019 23:10    Medications: I have reviewed the patient's current medications.  Assessment/Plan:  54 y.o.malewith a past  medical history of migraines, presents emergency department for intermittent dizziness and tingling of his left leg. According to the patient over the past 2 days or so he has been feeling intermittent episodes of dizziness/lightheadedness. States they mostly occur upon standing. He states also yesterday at times he was feeling some mild tingling in his left leg. Patient states he is also been experiencing a migraine for the past 2 days but is not atypical for him  - Overnight worsened symptoms and pt has LUE and LLE drift alogn with pain  - MRI consistent with R thalamic stroke which could also be causing the pain on LUE - No anti platelet therapy prior.  - ASA 325/statin daily - pt/ot - MRA no significant intracranial stenosis - 2decho - permissive HTN today.   LOS: 0 days    12/26/2019  11:34 AM

## 2019-12-26 NOTE — ED Notes (Signed)
Repeat bloodwork obtained by this RN. Pt tolerated well. Pt denies further needs at this time.

## 2019-12-26 NOTE — Evaluation (Signed)
Occupational Therapy Evaluation Patient Details Name: Carlos Nolan MRN: [id number] DOB: [birth year] Today's Date: 12/26/2019    History of Present Illness Patient is a 54 y.o. malepresented to the ED for the second time in 2 days with numbness of the left side associated with headache and dizziness. On his first visit on December 24, 2019 he had associated chest pain and headache and MRI that showed no acute abnormality. returned on the morning of 8/18 with recurrent symptoms, associated with dizziness and worsening numbness of the left arm and left leg though with improvement of his headache. MRI shows acute lacunar infarct in the lateral right thalamus    Clinical Impression   Patient presenting with decreased I in self care, balance, functional mobility/transfers, endurance, and safety awareness. Pt continues to report "pins and needles" from L ear down to L LE. Pt with increased pain when attempting to utilize L UE with functional tasks. Patient reports being independent, working full time, and driving PTA. Patient currently functioning at  [facility] A overall. Patient will benefit from acute OT to increase overall independence in the areas of ADLs, functional mobility, and safety awareness in order to safely discharge home with caregiver.    Follow Up Recommendations  Home health OT;Supervision/Assistance - 24 hour    Equipment Recommendations  3 in 1 bedside commode;Tub/shower seat    Recommendations for Other Services Other (comment) (none at this time)     Precautions / Restrictions Precautions Precautions: Fall Restrictions Weight Bearing Restrictions: No      Mobility Bed Mobility Overal bed mobility: Needs Assistance Bed Mobility: Sit to Supine       Sit to supine: Modified independent (Device/Increase time)      Transfers Overall transfer level: Needs assistance Equipment used: None Transfers: Sit to/from Stand Sit to Stand: Min guard         General transfer  comment: Min guard assistance with multiple sit to stand transfers from stretcher     Balance Overall balance assessment: Needs assistance Sitting-balance support: Feet supported Sitting balance-Leahy Scale: Normal Sitting balance - Comments: exterbal perturbations in sitting position in all directions without loss of balance    Standing balance support: During functional activity;No upper extremity supported Standing balance-Leahy Scale: Poor Standing balance comment: with static standing, patient able to maintain with Min guard assistance. with dynamic activity, patient required Min A- Mod A        ADL either performed or assessed with clinical judgement   ADL Overall ADL's : Needs assistance/impaired      General ADL Comments: Pt attempting figure four position to tie/untie and don/doff shoes but needing assistance secondary to pain in L UE and LE     Vision Baseline Vision/History: Wears glasses Wears Glasses: At all times Patient Visual Report: No change from baseline              Pertinent Vitals/Pain Pain Assessment: Faces Faces Pain Scale: Hurts a little bit Pain Location: left arm  Pain Descriptors / Indicators: Pins and needles Pain Intervention(s): Limited activity within patient's tolerance;Monitored during session     Hand Dominance Right   Extremity/Trunk Assessment Upper Extremity Assessment Upper Extremity Assessment: LUE deficits/detail LUE Deficits / Details: PROM WFLs. AROM limited secondary to pain for shoulder elevation to 90 degrees LUE Sensation: decreased light touch LUE Coordination: decreased fine motor;decreased gross motor   Lower Extremity Assessment Lower Extremity Assessment: Defer to PT evaluation RLE Deficits / Details: WNL  RLE Sensation: WNL RLE Coordination: WNL LLE  Deficits / Details: 4/5 knee extension, 5/5 knee flexion, dorsiflexion 4+/5, plantarflexion 4+/5, hip add 4/5, hip abd 4/5, 4/5 hip flexion  LLE Sensation: decreased  light touch LLE Coordination: decreased gross motor   Cervical / Trunk Assessment Cervical / Trunk Assessment: Normal   Communication Communication Communication: No difficulties   Cognition Arousal/Alertness: Awake/alert Behavior During Therapy: WFL for tasks assessed/performed Overall Cognitive Status: Within Functional Limits for tasks assessed                       Home Living Family/patient expects to be discharged to:: Private residence Living Arrangements: Spouse/significant other Available Help at Discharge: Family Type of Home: House Home Access: Stairs to enter Technical brewer of Steps: 4   Home Layout: Able to live on main level with bedroom/bathroom     Bathroom Shower/Tub: Occupational psychologist: Standard     Home Equipment: Crutches          Prior Functioning/Environment Level of Independence: Independent                 OT Problem List: Decreased strength;Decreased coordination;Pain;Decreased range of motion;Decreased cognition;Decreased activity tolerance;Decreased safety awareness;Impaired balance (sitting and/or standing);Decreased knowledge of use of DME or AE;Decreased knowledge of precautions      OT Treatment/Interventions: Self-care/ADL training;Therapeutic exercise;Therapeutic activities;Neuromuscular education;Energy conservation;DME and/or AE instruction;Patient/family education;Manual therapy;Balance training    OT Goals(Current goals can be found in the care plan section) Acute Rehab OT Goals Patient Stated Goal: to go home  OT Goal Formulation: With patient Time For Goal Achievement: 01/09/20 Potential to Achieve Goals: Good ADL Goals Pt Will Perform Grooming: with modified independence;standing Pt Will Transfer to Toilet: with modified independence;ambulating Pt Will Perform Toileting - Clothing Manipulation and hygiene: with modified independence;sit to/from stand Pt Will Perform Tub/Shower Transfer:  with supervision;shower seat  OT Frequency: Min 1X/week   Barriers to D/C: Other (comment)  none known at this time          AM-PAC OT "6 Clicks" Daily Activity     Outcome Measure Help from another person eating meals?: A Little Help from another person taking care of personal grooming?: A Little Help from another person toileting, which includes using toliet, bedpan, or urinal?: A Little Help from another person bathing (including washing, rinsing, drying)?: A Little Help from another person to put on and taking off regular upper body clothing?: A Little Help from another person to put on and taking off regular lower body clothing?: A Little 6 Click Score: 18   End of Session    Activity Tolerance: Patient tolerated treatment well Patient left: in bed;with call bell/phone within reach (Pt present for evaluation - pt transitioned easily)  OT Visit Diagnosis: Unsteadiness on feet (R26.81);Muscle weakness (generalized) (M62.81);Hemiplegia and hemiparesis Hemiplegia - Right/Left: Left Hemiplegia - dominant/non-dominant: Non-Dominant Hemiplegia - caused by: Cerebral infarction                Time: 2482-5003 OT Time Calculation (min): 21 min Charges:  OT General Charges $OT Visit: 1 Visit OT Evaluation $OT Eval Low Complexity: 1 Low OT Treatments $Self Care/Home Management : 8-22 mins  Darleen Crocker, MS, OTR/L , CBIS ascom [phone]  12/26/19, 3:48 PM

## 2019-12-26 NOTE — ED Notes (Signed)
Pt discharged home after verbalizing understanding of discharge instructions; nad noted. 

## 2019-12-26 NOTE — ED Notes (Signed)
This RN to bedside at this time, introduced self to patient. VS obtained by this RN. Pt asking about why he has been in hallway and requesting a different bed. This RN apologized and explained delay to patient at this time. Pt states understanding. Pt intermittently visualized using personal cell phone. Modified NIH performed by this RN. Pt c/o continued numbness to L neck up to his head and down his L arm at this time. Pt able to speak in full and complete sentences, no slurred speech noted at this time.

## 2019-12-26 NOTE — Progress Notes (Signed)
SLP Cancellation Note  Patient Details Name: Carlos Nolan MRN: [id number] DOB: 11/01/1965   Cancelled treatment:       Reason Eval/Treat Not Completed: SLP screened, no needs identified, will sign off (chart reviewed; consulted NSG then met w/ pt in ED) Pt denied any difficulty swallowing and is currently on a regular diet; tolerates swallowing pills w/ water per NSG. Pt is eating graham crackers w/ gingerale during this screen. Pt conversed at conversational level w/out deficits noted; pt and NSG denied any speech-language deficits. Pt was conversing on his cell phone w/ family member appropriately w/ no dysarthria noted. Pt endorsed he was "tired"(did not sleep last night post admission) and Left facial numbness and heaviness. No overt droop or weakness noted; no deficits during bolus management. Pt endorsed Left U/LE weakness. No further skilled ST services indicated as pt appears at his baseline. Pt agreed. NSG to reconsult if any change in status during admit.    Orinda Kenner, [place], CCC-SLP Glady Ouderkirk 12/26/2019, 12:01 PM

## 2019-12-26 NOTE — ED Notes (Signed)
Pt visualized in NAD at this time, sitting up in bed eating peanut butter and crackers and drinking ginger ale at this time. Pt speaking/texting intermittently on personal cell phone at this time.

## 2019-12-26 NOTE — ED Notes (Signed)
Pt reporting increased numbness on L side. NP Ouma made aware. No new orders at this time. Will continue to monitor.

## 2019-12-26 NOTE — Discharge Summary (Signed)
Physician Discharge Summary   Carlos Nolan  male DOB: 1966/05/04  ZDG:[id number]  PCP: Patient, No Pcp Per  Admit date: 12/25/2019 Discharge date: 12/26/2019  Admitted From: home Disposition:  home Home Health: Yes CODE STATUS: Full code  Discharge Instructions    Discharge instructions   Complete by: As directed    Because you have had a stroke, you are prescribed aspirin 325 mg daily and Lipitor 40 mg daily for stroke prevention.    Please follow up with outpatient neurology.     Dr. Enzo Bi [facility] Course:  For full details, please see H&P, progress notes, consult notes and ancillary notes.  Briefly,  Carlos Nolan is a 54 y.o. male with history of A. fib status post ablation 2017 who presented to the ED for the second time in 2 days with numbness of the left side associated with headache and dizziness.    On his first visit on December 24, 2019 he had associated chest pain and headache.  He had an MRI that showed no acute abnormality.  He was discharged to follow-up with his PCP.  He returned on the morning of 8/18 with recurrent symptoms, associated with dizziness and worsening numbness of the left arm and left leg though with improvement of his headache.  He was evaluated by neurologist, Dr. Irish Elders in the emergency room who recommended MRI/MRA, which returned showing several acute lacunar infarct in the lateral right thalamus near the posterior limb right external capsule and concordant with left-sided symptoms.    Acute right thalamic infarction [facility]) MRA no significant intracranial stenosis.  Echo showed normal heart function with no atrial level shunt.  Carotid US showed no significant stenosis.  LDL 145.  Neuro recommended ASA 325 and Lipitor 40 mg daily.  Pt was referred to outpatient neurology for followup.  Prior to discharge, pt still had left-sided weakness.  PT/OT recommended [facility] PT/OT.  Hx of atrial fibrillation S/P ablation in 2017  EKG  showed sinus rhythm.  Not currently on anticoagulation.   Discharge Diagnoses:  Active Problems:   Acute thalamic infarction [facility])   S/P ablation of atrial fibrillation   Stroke (cerebrum) [facility])    Discharge Instructions:  Allergies as of 12/26/2019      Reactions   Banana Swelling   throat   Penicillins Rash, Hives   Has patient had a PCN reaction causing immediate rash, facial/tongue/throat swelling, SOB or lightheadedness with hypotension:unsure Has patient had a PCN reaction causing severe rash involving mucus membranes or skin necrosis:No Has patient had a PCN reaction that required hospitalization:No Has patient had a PCN reaction occurring within the last 10 years:No If all of the above answers are "NO", then may proceed with Cephalosporin use. Has patient had a PCN reaction causing immediate rash, facial/tongue/throat swelling, SOB or lightheadedness with hypotension:unsure Has patient had a PCN reaction causing severe rash involving mucus membranes or skin necrosis:No Has patient had a PCN reaction that required hospitalization:No Has patient had a PCN reaction occurring within the last 10 years:No If all of the above answers are "NO", then may proceed with Cephalosporin use. Has patient had a PCN reaction causing immediate rash, facial/tongue/throat swelling, SOB or lightheadedness with hypotension:unsure Has patient had a PCN reaction causing severe rash involving mucus membranes or skin necrosis:No Has patient had a PCN reaction that required hospitalization:No Has patient had a PCN reaction occurring within the last 10 years:No If all of the above  answers are "NO", then may proceed with Cephalosporin use. Has patient had a PCN reaction causing immediate rash, facial/tongue/throat swelling, SOB or lightheadedness with hypotension:unsure Has patient had a PCN reaction causing severe rash involving mucus membranes or skin necrosis:No Has patient had a PCN reaction that  required hospitalization:No Has patient had a PCN reaction occurring within the last 10 years:No If all of the above answers are "NO", then may proceed with Cephalosporin use. Has patient had a PCN reaction causing immediate rash, facial/tongue/throat swelling, SOB or lightheadedness with hypotension:unsure Has patient had a PCN reaction causing sev... (TRUNCATED)   Quinidine Rash      Medication List    STOP taking these medications   HYDROcodone-acetaminophen 5-325 MG tablet Commonly known as: Norco     TAKE these medications    stroke: mapping our early stages of recovery book Misc 1 each by Does not apply route once for 1 dose.   aspirin 325 MG EC tablet Take 1 tablet (325 mg total) by mouth daily. Start taking on: December 27, 2019   atorvastatin 40 MG tablet Commonly known as: LIPITOR Take 1 tablet (40 mg total) by mouth daily at 6 PM.   butalbital-acetaminophen-caffeine 50-325-40 MG tablet Commonly known as: FIORICET Take 1-2 tablets by mouth every 6 (six) hours as needed for headache.   hydrochlorothiazide 25 MG tablet Commonly known as: HYDRODIURIL Take 1 tablet (25 mg total) by mouth daily.        Follow-up Information    Anabel Bene, MD. Schedule an appointment as soon as possible for a visit in 1 month(s).   Specialty: Neurology Contact information: [facility] [facility] [place] [address] [phone]               Allergies  Allergen Reactions  . Banana Swelling    throat  . Penicillins Rash and Hives    Has patient had a PCN reaction causing immediate rash, facial/tongue/throat swelling, SOB or lightheadedness with hypotension:unsure Has patient had a PCN reaction causing severe rash involving mucus membranes or skin necrosis:No Has patient had a PCN reaction that required hospitalization:No Has patient had a PCN reaction occurring within the last 10 years:No If all of the above answers are "NO", then  may proceed with Cephalosporin use.  Has patient had a PCN reaction causing immediate rash, facial/tongue/throat swelling, SOB or lightheadedness with hypotension:unsure Has patient had a PCN reaction causing severe rash involving mucus membranes or skin necrosis:No Has patient had a PCN reaction that required hospitalization:No Has patient had a PCN reaction occurring within the last 10 years:No If all of the above answers are "NO", then may proceed with Cephalosporin use. Has patient had a PCN reaction causing immediate rash, facial/tongue/throat swelling, SOB or lightheadedness with hypotension:unsure Has patient had a PCN reaction causing severe rash involving mucus membranes or skin necrosis:No Has patient had a PCN reaction that required hospitalization:No Has patient had a PCN reaction occurring within the last 10 years:No If all of the above answers are "NO", then may proceed with Cephalosporin use. Has patient had a PCN reaction causing immediate rash, facial/tongue/throat swelling, SOB or lightheadedness with hypotension:unsure Has patient had a PCN reaction causing severe rash involving mucus membranes or skin necrosis:No Has patient had a PCN reaction that required hospitalization:No Has patient had a PCN reaction occurring within the last 10 years:No If all of the above answers are "NO", then may proceed with Cephalosporin use. Has patient had a PCN reaction causing immediate  rash, facial/tongue/throat swelling, SOB or lightheadedness with hypotension:unsure Has patient had a PCN reaction causing sev... (TRUNCATED)  . Quinidine Rash     The results of significant diagnostics from this hospitalization (including imaging, microbiology, ancillary and laboratory) are listed below for reference.   Consultations:   Procedures/Studies: DG Chest 2 View  Result Date: 12/25/2019 CLINICAL DATA:  Chest pain. Additional provided: Left sided numbness. EXAM: CHEST - 2 VIEW COMPARISON:   Prior chest radiographs 05/30/2013 and earlier FINDINGS: Heart size within normal limits. No appreciable airspace consolidation or pulmonary edema. No evidence of pleural effusion or pneumothorax. No acute bony abnormality identified. IMPRESSION: No evidence of active cardiopulmonary disease. Electronically Signed   By: Kellie Simmering DO   On: 12/25/2019 08:09   CT Head Wo Contrast  Result Date: 12/24/2019 CLINICAL DATA:  Dizziness with standing since yesterday, lightheadedness, intermittent left-sided weakness EXAM: CT HEAD WITHOUT CONTRAST TECHNIQUE: Contiguous axial images were obtained from the base of the skull through the vertex without intravenous contrast. COMPARISON:  None. FINDINGS: Brain: Hypodensities are seen within the bilateral parietal periventricular white matter, as well as a focal hypodensity within the right frontal subcortical white matter. These findings are consistent with age indeterminate ischemic change. No other signs of acute infarct or hemorrhage. Lateral ventricles and midline structures are unremarkable. No acute extra-axial fluid collections. No mass effect. Vascular: No hyperdense vessel or unexpected calcification. Skull: Normal. Negative for fracture or focal lesion. Sinuses/Orbits: No acute finding. Other: None. IMPRESSION: 1. Age-indeterminate small vessel ischemic changes within the right frontal subcortical white matter and bilateral parietal periventricular white matter. 2. No acute hemorrhage. Electronically Signed   By: Randa Ngo M.D.   On: 12/24/2019 21:04   MR ANGIO HEAD WO CONTRAST  Result Date: 12/25/2019 CLINICAL DATA:  54 year old male with dizziness, left lower extremity tingling which has now progressed to entire left side numbness since the MRI yesterday. EXAM: MRI HEAD WITHOUT CONTRAST MRA HEAD WITHOUT CONTRAST MRA NECK WITHOUT AND WITH CONTRAST TECHNIQUE: Multiplanar, multiecho pulse sequences of the brain and surrounding structures were obtained  without contrast. Angiographic images of the Circle of Willis were obtained using MRA technique without intravenous contrast. Angiographic images of the neck were obtained using MRA technique without and with intravenous contrast. Carotid stenosis measurements (when applicable) are obtained utilizing NASCET criteria, using the distal internal carotid diameter as the denominator. CONTRAST:  71mL GADAVIST GADOBUTROL 1 MMOL/ML IV SOLN COMPARISON:  Brain MRI and head CT 12/24/2019. FINDINGS: Study is intermittently degraded by motion artifact despite repeated imaging attempts. MRI HEAD FINDINGS Brain: Subtle restricted diffusion suspected in the lateral right thalamus now, near the posterior limb right external capsule (series 27, image 25). Faint T2 and FLAIR hyperintensity. No associated hemorrhage or mass effect. No other restricted diffusion. No midline shift, mass effect, evidence of mass lesion, ventriculomegaly, extra-axial collection or acute intracranial hemorrhage. Cervicomedullary junction and pituitary are within normal limits. Advanced bilateral cerebral white matter T2 and FLAIR hyperintensity redemonstrated in both hemispheres,, and in a nonspecific configuration. Some of these areas most resemble chronic white matter lacunar infarcts, although demyelinating disease would also be a consideration. On T2* imaging there is evidence of at least 1 chronic microhemorrhage in the right hemisphere (series 24, image 20). No cortical encephalomalacia identified. Vascular: Major intracranial vascular flow voids appear stable since yesterday. Skull and upper cervical spine: Negative. Sinuses/Orbits: Stable, negative. Other: Mastoids remain clear. Grossly normal visible internal auditory structures. MRA NECK FINDINGS Precontrast time-of-flight neck MRA images reveal antegrade  flow in both cervical carotid and vertebral arteries. Both carotid bifurcations appear patent and normal. Post-contrast neck MRA images reveal a  3 vessel arch configuration. The proximal great vessels appear normal. The right CCA, right carotid bifurcation and cervical right ICA appear normal. The left CCA appears normal aside from mild tortuosity. The left carotid bifurcation and cervical left ICA appear normal. Both proximal subclavian arteries and vertebral artery origins are within normal limits. Mildly tortuous left V1 segment. Codominant vertebral arteries appear normal to the skull base. MRA HEAD FINDINGS Antegrade flow in the posterior circulation with codominant distal vertebral arteries. Patent PICA origins. Patent vertebrobasilar junction. No distal vertebral or basilar stenosis. Patent SCA and PCA origins. Fetal type right PCA origin. The left posterior communicating artery is diminutive or absent. Bilateral PCA branches appear patent. There is mild irregularity and stenosis in both PCA P2 segments. Antegrade flow in both ICA siphons. No siphon stenosis. Ophthalmic and right posterior communicating artery origins appear normal. Patent carotid termini. Normal MCA and ACA origins. Right ACA A1 segment is mildly dominant. Anterior communicating artery and visible ACA branches are within normal limits. MCA M1 segments and MCA bifurcations are patent. Visible bilateral MCA branches are within normal limits. IMPRESSION: 1. 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 No associated hemorrhage or mass effect. 2. No other acute intracranial abnormality. 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 Top differential considerations are accelerated small-vessel disease and demyelinating disease. 3. Negative Neck MRA. 4. Intracranial MRA is negative aside from some evidence of bilateral PCA P2 segment atherosclerosis, with only mild stenosis. Electronically Signed   By: Genevie Ann M.D.   On: 12/25/2019 17:38   MR Angiogram Neck W or  Wo Contrast  Result Date: 12/25/2019 CLINICAL DATA:  54 year old male with dizziness, left lower extremity tingling which has now progressed to entire left side numbness since the MRI yesterday. EXAM: MRI HEAD WITHOUT CONTRAST MRA HEAD WITHOUT CONTRAST MRA NECK WITHOUT AND WITH CONTRAST TECHNIQUE: Multiplanar, multiecho pulse sequences of the brain and surrounding structures were obtained without contrast. Angiographic images of the Circle of Willis were obtained using MRA technique without intravenous contrast. Angiographic images of the neck were obtained using MRA technique without and with intravenous contrast. Carotid stenosis measurements (when applicable) are obtained utilizing NASCET criteria, using the distal internal carotid diameter as the denominator. CONTRAST:  66mL GADAVIST GADOBUTROL 1 MMOL/ML IV SOLN COMPARISON:  Brain MRI and head CT 12/24/2019. FINDINGS: Study is intermittently degraded by motion artifact despite repeated imaging attempts. MRI HEAD FINDINGS Brain: Subtle restricted diffusion suspected in the lateral right thalamus now, near the posterior limb right external capsule (series 27, image 25). Faint T2 and FLAIR hyperintensity. No associated hemorrhage or mass effect. No other restricted diffusion. No midline shift, mass effect, evidence of mass lesion, ventriculomegaly, extra-axial collection or acute intracranial hemorrhage. Cervicomedullary junction and pituitary are within normal limits. Advanced bilateral cerebral white matter T2 and FLAIR hyperintensity redemonstrated in both hemispheres,, and in a nonspecific configuration. Some of these areas most resemble chronic white matter lacunar infarcts, although demyelinating disease would also be a consideration. On T2* imaging there is evidence of at least 1 chronic microhemorrhage in the right hemisphere (series 24, image 20). No cortical encephalomalacia identified. Vascular: Major intracranial vascular flow voids appear stable  since yesterday. Skull and upper cervical spine: Negative. Sinuses/Orbits: Stable, negative. Other: Mastoids remain clear. Grossly normal visible internal  auditory structures. MRA NECK FINDINGS Precontrast time-of-flight neck MRA images reveal antegrade flow in both cervical carotid and vertebral arteries. Both carotid bifurcations appear patent and normal. Post-contrast neck MRA images reveal a 3 vessel arch configuration. The proximal great vessels appear normal. The right CCA, right carotid bifurcation and cervical right ICA appear normal. The left CCA appears normal aside from mild tortuosity. The left carotid bifurcation and cervical left ICA appear normal. Both proximal subclavian arteries and vertebral artery origins are within normal limits. Mildly tortuous left V1 segment. Codominant vertebral arteries appear normal to the skull base. MRA HEAD FINDINGS Antegrade flow in the posterior circulation with codominant distal vertebral arteries. Patent PICA origins. Patent vertebrobasilar junction. No distal vertebral or basilar stenosis. Patent SCA and PCA origins. Fetal type right PCA origin. The left posterior communicating artery is diminutive or absent. Bilateral PCA branches appear patent. There is mild irregularity and stenosis in both PCA P2 segments. Antegrade flow in both ICA siphons. No siphon stenosis. Ophthalmic and right posterior communicating artery origins appear normal. Patent carotid termini. Normal MCA and ACA origins. Right ACA A1 segment is mildly dominant. Anterior communicating artery and visible ACA branches are within normal limits. MCA M1 segments and MCA bifurcations are patent. Visible bilateral MCA branches are within normal limits. IMPRESSION: 1. 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 No associated hemorrhage or mass effect. 2. No other acute intracranial abnormality. Advanced  but nonspecific cerebral white matter disease re-demonstrated. Top differential considerations are accelerated small-vessel disease and demyelinating disease. 3. Negative Neck MRA. 4. Intracranial MRA is negative aside from some evidence of bilateral PCA P2 segment atherosclerosis, with only mild stenosis. Electronically Signed   By: Genevie Ann M.D.   On: 12/25/2019 17:38   MR BRAIN WO CONTRAST  Result Date: 12/25/2019 CLINICAL DATA:  54 year old male with dizziness, left lower extremity tingling which has now progressed to entire left side numbness since the MRI yesterday. EXAM: MRI HEAD WITHOUT CONTRAST MRA HEAD WITHOUT CONTRAST MRA NECK WITHOUT AND WITH CONTRAST TECHNIQUE: Multiplanar, multiecho pulse sequences of the brain and surrounding structures were obtained without contrast. Angiographic images of the Circle of Willis were obtained using MRA technique without intravenous contrast. Angiographic images of the neck were obtained using MRA technique without and with intravenous contrast. Carotid stenosis measurements (when applicable) are obtained utilizing NASCET criteria, using the distal internal carotid diameter as the denominator. CONTRAST:  36mL GADAVIST GADOBUTROL 1 MMOL/ML IV SOLN COMPARISON:  Brain MRI and head CT 12/24/2019. FINDINGS: Study is intermittently degraded by motion artifact despite repeated imaging attempts. MRI HEAD FINDINGS Brain: Subtle restricted diffusion suspected in the lateral right thalamus now, near the posterior limb right external capsule (series 27, image 25). Faint T2 and FLAIR hyperintensity. No associated hemorrhage or mass effect. No other restricted diffusion. No midline shift, mass effect, evidence of mass lesion, ventriculomegaly, extra-axial collection or acute intracranial hemorrhage. Cervicomedullary junction and pituitary are within normal limits. Advanced bilateral cerebral white matter T2 and FLAIR hyperintensity redemonstrated in both hemispheres,, and in a  nonspecific configuration. Some of these areas most resemble chronic white matter lacunar infarcts, although demyelinating disease would also be a consideration. On T2* imaging there is evidence of at least 1 chronic microhemorrhage in the right hemisphere (series 24, image 20). No cortical encephalomalacia identified. Vascular: Major intracranial vascular flow voids appear stable since yesterday. Skull and upper cervical spine: Negative. Sinuses/Orbits: Stable, negative.  Other: Mastoids remain clear. Grossly normal visible internal auditory structures. MRA NECK FINDINGS Precontrast time-of-flight neck MRA images reveal antegrade flow in both cervical carotid and vertebral arteries. Both carotid bifurcations appear patent and normal. Post-contrast neck MRA images reveal a 3 vessel arch configuration. The proximal great vessels appear normal. The right CCA, right carotid bifurcation and cervical right ICA appear normal. The left CCA appears normal aside from mild tortuosity. The left carotid bifurcation and cervical left ICA appear normal. Both proximal subclavian arteries and vertebral artery origins are within normal limits. Mildly tortuous left V1 segment. Codominant vertebral arteries appear normal to the skull base. MRA HEAD FINDINGS Antegrade flow in the posterior circulation with codominant distal vertebral arteries. Patent PICA origins. Patent vertebrobasilar junction. No distal vertebral or basilar stenosis. Patent SCA and PCA origins. Fetal type right PCA origin. The left posterior communicating artery is diminutive or absent. Bilateral PCA branches appear patent. There is mild irregularity and stenosis in both PCA P2 segments. Antegrade flow in both ICA siphons. No siphon stenosis. Ophthalmic and right posterior communicating artery origins appear normal. Patent carotid termini. Normal MCA and ACA origins. Right ACA A1 segment is mildly dominant. Anterior communicating artery and visible ACA branches are  within normal limits. MCA M1 segments and MCA bifurcations are patent. Visible bilateral MCA branches are within normal limits. IMPRESSION: 1. 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 No associated hemorrhage or mass effect. 2. No other acute intracranial abnormality. 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 Top differential considerations are accelerated small-vessel disease and demyelinating disease. 3. Negative Neck MRA. 4. Intracranial MRA is negative aside from some evidence of bilateral PCA P2 segment atherosclerosis, with only mild stenosis. Electronically Signed   By: Genevie Ann M.D.   On: 12/25/2019 17:38   MR BRAIN WO CONTRAST  Result Date: 12/24/2019 CLINICAL DATA:  Dizziness.  Left lower extremity tingling EXAM: MRI HEAD WITHOUT CONTRAST TECHNIQUE: Multiplanar, multiecho pulse sequences of the brain and surrounding structures were obtained without intravenous contrast. COMPARISON:  None. FINDINGS: BRAIN: No acute infarct, acute hemorrhage or extra-axial collection. Multifocal white matter hyperintensity, most commonly due to chronic ischemic microangiopathy. Normal volume of CSF spaces. No chronic microhemorrhage. Normal midline structures. VASCULAR: Major flow voids are preserved. SKULL AND UPPER CERVICAL SPINE: Normal calvarium and skull base. Visualized upper cervical spine and soft tissues are normal. SINUSES/ORBITS: No paranasal sinus fluid levels or advanced mucosal thickening. No mastoid or middle ear effusion. Normal orbits. IMPRESSION: 1. No acute intracranial abnormality. 2. Multifocal white matter hyperintensity, most commonly due to chronic ischemic microangiopathy. Electronically Signed   By: Ulyses Jarred M.D.   On: 12/24/2019 23:10      Labs: BNP (last 3 results) No results for input(s): BNP in the last 8760 hours. Basic Metabolic Panel: Recent  Labs  Lab 12/24/19 1750 12/25/19 0725 12/26/19 1055  NA 139 133* 140  K 3.9 3.5 3.8  CL 105 99 105  CO2 26 24 22   GLUCOSE 105* 120* 108*  BUN 20 19 19   CREATININE 0.93 1.06 0.95  CALCIUM 8.9 8.9 9.4  MG  --   --  1.9   Liver Function Tests: Recent Labs  Lab 12/25/19 0725  AST 20  ALT 26  ALKPHOS 101  BILITOT 1.1  PROT 7.6  ALBUMIN 4.2   No results for input(s): LIPASE, AMYLASE in the last 168 hours. No results for input(s):  AMMONIA in the last 168 hours. CBC: Recent Labs  Lab 12/24/19 1750 12/25/19 0725 12/26/19 1055  WBC 7.6 6.7 12.7*  NEUTROABS  --  4.1  --   HGB 15.2 15.4 15.4  HCT 41.8 42.9 43.3  MCV 86.5 89.0 88.4  PLT 186 172 183   Cardiac Enzymes: No results for input(s): CKTOTAL, CKMB, CKMBINDEX, TROPONINI in the last 168 hours. BNP: Invalid input(s): POCBNP CBG: No results for input(s): GLUCAP in the last 168 hours. D-Dimer No results for input(s): DDIMER in the last 72 hours. Hgb A1c Recent Labs    12/26/19 1055  HGBA1C 5.8*   Lipid Profile Recent Labs    12/26/19 1055  CHOL 198  HDL 37*  LDLCALC 145*  TRIG 78  CHOLHDL 5.4   Thyroid function studies Recent Labs    12/26/19 0500  TSH 2.557   Anemia work up No results for input(s): VITAMINB12, FOLATE, FERRITIN, TIBC, IRON, RETICCTPCT in the last 72 hours. Urinalysis    Component Value Date/Time   COLORURINE YELLOW (A) 12/25/2019 1419   APPEARANCEUR CLEAR (A) 12/25/2019 1419   LABSPEC 1.017 12/25/2019 1419   PHURINE 5.0 12/25/2019 1419   GLUCOSEU NEGATIVE 12/25/2019 1419   HGBUR NEGATIVE 12/25/2019 1419   BILIRUBINUR NEGATIVE 12/25/2019 1419   KETONESUR NEGATIVE 12/25/2019 1419   PROTEINUR NEGATIVE 12/25/2019 1419   NITRITE NEGATIVE 12/25/2019 1419   LEUKOCYTESUR NEGATIVE 12/25/2019 1419   Sepsis Labs Invalid input(s): PROCALCITONIN,  WBC,  LACTICIDVEN Microbiology Recent Results (from the past 240 hour(s))  SARS Coronavirus 2 by RT PCR (hospital order, performed in  [place] hospital lab) Nasopharyngeal Nasopharyngeal Swab     Status: None   Collection Time: 12/25/19  6:03 PM   Specimen: Nasopharyngeal Swab  Result Value Ref Range Status   SARS Coronavirus 2 NEGATIVE NEGATIVE Final    Comment: (NOTE) SARS-CoV-2 target nucleic acids are NOT DETECTED.  The SARS-CoV-2 RNA is generally detectable in upper and lower respiratory specimens during the acute phase of infection. The lowest concentration of SARS-CoV-2 viral copies this assay can detect is 250 copies / mL. A negative result does not preclude SARS-CoV-2 infection and should not be used as the sole basis for treatment or other patient management decisions.  A negative result may occur with improper specimen collection / handling, submission of specimen other than nasopharyngeal swab, presence of viral mutation(s) within the areas targeted by this assay, and inadequate number of viral copies (<250 copies / mL). A negative result must be combined with clinical observations, patient history, and epidemiological information.  Fact Sheet for Patients:   StrictlyIdeas.no  Fact Sheet for Healthcare Providers: BankingDealers.co.za  This test is not yet approved or  cleared by the [place] FDA and has been authorized for detection and/or diagnosis of SARS-CoV-2 by FDA under an Emergency Use Authorization (EUA).  This EUA will remain in effect (meaning this test can be used) for the duration of the COVID-19 declaration under Section 564(b)(1) of the Act, 21 U.S.C. section 360bbb-3(b)(1), unless the authorization is terminated or revoked sooner.  Performed at [facility], [address] [place], [place] [address]      Total time spend on discharging this patient, including the last patient exam, discussing the hospital stay, instructions for ongoing care as it relates to all pertinent caregivers, as well as preparing the medical  discharge records, prescriptions, and/or referrals as applicable, is 45 minutes.    Enzo Bi, MD  [facility] 12/26/2019, 3:35 PM  If  7PM-7AM, please contact night-coverage

## 2019-12-26 NOTE — Evaluation (Signed)
Physical Therapy Evaluation Patient Details Name: STEELE Nolan MRN: [id number] DOB: 11-12-1965 Today's Date: 12/26/2019   History of Present Illness  Patient is a 54 y.o. malepresented to the ED for the second time in 2 days with numbness of the left side associated with headache and dizziness. On his first visit on December 24, 2019 he had associated chest pain and headache and MRI that showed no acute abnormality. returned on the morning of 8/18 with recurrent symptoms, associated with dizziness and worsening numbness of the left arm and left leg though with improvement of his headache. MRI shows acute lacunar infarct in the lateral right thalamus     Clinical Impression  PT evaluation completed. In the left leg, patient has weakness, impaired coordination, and decreased sensation to light touch. Patient also observed to have LUE weakness with functional activity. Patient needs Min guard assistance for standing multiple times from stretcher. Patient stood and weight shifted with Min guard assistance with occasional left lean. When taking side steps along edge of bed, patient required Min A for steadying and cues for left foot placement due to decreased coordination with foot placement. When taking forward steps, patient needs Mod A for fall prevention as left knee buckled. Patient was previously independent with mobility. Recommend PT to address functional limitations listed below to maximize independence. Recommend HHPT at this time, however this may need to be adjusted based on progress.      Follow Up Recommendations Home health PT (ongoing assessment based on progress )    Equipment Recommendations  Other (comment) (ongoing assessment )    Recommendations for Other Services       Precautions / Restrictions Precautions Precautions: Fall Restrictions Weight Bearing Restrictions: No      Mobility  Bed Mobility Overal bed mobility: Needs Assistance Bed Mobility: Sit to Supine        Sit to supine: Modified independent (Device/Increase time)      Transfers Overall transfer level: Needs assistance Equipment used: None Transfers: Sit to/from Stand Sit to Stand: Min guard         General transfer comment: Min guard assistance with multiple sit to stand transfers from stretcher   Ambulation/Gait Ambulation/Gait assistance: Mod assist;Min assist           General Gait Details: patient able to take several side steps to left and right with decreased coordination with left foot placement. patient took 2 steps forward away from stretcher and was unable to maintain standing balance without Mod A due to left leg weakness. questionable left knee buckling x 1. unable to advance ambulation further at this time   Stairs            Wheelchair Mobility    Modified Rankin (Stroke Patients Only)       Balance Overall balance assessment: Needs assistance Sitting-balance support: Feet supported Sitting balance-Leahy Scale: Normal Sitting balance - Comments: exterbal perturbations in sitting position in all directions without loss of balance    Standing balance support: During functional activity;No upper extremity supported Standing balance-Leahy Scale: Poor Standing balance comment: with static standing, patient able to maintain with Min guard assistance. with dynamic activity, patient required Min A- Mod A                              Pertinent Vitals/Pain Pain Assessment: Faces Faces Pain Scale: Hurts a little bit Pain Location: left arm  Pain Descriptors / Indicators: Pins and  needles Pain Intervention(s): Limited activity within patient's tolerance    Home Living Family/patient expects to be discharged to:: Private residence Living Arrangements: Spouse/significant other Available Help at Discharge: Family Type of Home: House Home Access: Stairs to enter   Technical brewer of Steps: 4 Home Layout: Able to live on main level  with bedroom/bathroom Home Equipment: Crutches      Prior Function Level of Independence: Independent               Hand Dominance   Dominant Hand: Right    Extremity/Trunk Assessment   Upper Extremity Assessment Upper Extremity Assessment: LUE deficits/detail LUE Deficits / Details: see OT note for details. impaired LUE strength with functional observation  LUE Sensation: decreased light touch    Lower Extremity Assessment Lower Extremity Assessment: RLE deficits/detail;LLE deficits/detail RLE Deficits / Details: WNL  RLE Sensation: WNL RLE Coordination: WNL LLE Deficits / Details: 4/5 knee extension, 5/5 knee flexion, dorsiflexion 4+/5, plantarflexion 4+/5, hip add 4/5, hip abd 4/5, 4/5 hip flexion  LLE Sensation: decreased light touch LLE Coordination: decreased gross motor       Communication   Communication: No difficulties  Cognition Arousal/Alertness: Awake/alert Behavior During Therapy: WFL for tasks assessed/performed Overall Cognitive Status: Within Functional Limits for tasks assessed                                        General Comments      Exercises     Assessment/Plan    PT Assessment Patient needs continued PT services  PT Problem List Decreased strength;Decreased activity tolerance;Decreased balance;Decreased mobility;Decreased cognition;Decreased coordination;Decreased safety awareness;Decreased knowledge of use of DME;Decreased knowledge of precautions;Impaired sensation       PT Treatment Interventions DME instruction;Gait training;Functional mobility training;Stair training;Therapeutic activities;Therapeutic exercise;Balance training;Neuromuscular re-education;Patient/family education    PT Goals (Current goals can be found in the Care Plan section)  Acute Rehab PT Goals Patient Stated Goal: to go home  PT Goal Formulation: With patient Time For Goal Achievement: 01/09/20 Potential to Achieve Goals: Good     Frequency 7X/week   Barriers to discharge        Co-evaluation               AM-PAC PT "6 Clicks" Mobility  Outcome Measure Help needed turning from your back to your side while in a flat bed without using bedrails?: None Help needed moving from lying on your back to sitting on the side of a flat bed without using bedrails?: A Little Help needed moving to and from a bed to a chair (including a wheelchair)?: A Little Help needed standing up from a chair using your arms (e.g., wheelchair or bedside chair)?: A Little Help needed to walk in hospital room?: A Lot Help needed climbing 3-5 steps with a railing? : A Lot 6 Click Score: 17    End of Session Equipment Utilized During Treatment: Gait belt Activity Tolerance: Patient tolerated treatment well Patient left: Other (comment) (on stretcher in ED hallway ) Nurse Communication: Mobility status PT Visit Diagnosis: Muscle weakness (generalized) (M62.81);Unsteadiness on feet (R26.81);Other abnormalities of gait and mobility (R26.89)    Time: 5852-7782 PT Time Calculation (min) (ACUTE ONLY): 23 min   Charges:   PT Evaluation $PT Eval Low Complexity: 1 Low PT Treatments $Gait Training: 8-22 mins       Minna Merritts, PT, MPT   Percell Locus 12/26/2019, 2:45  PM   

## 2019-12-26 NOTE — ED Notes (Signed)
Pt resting in bed speaking on cell phone at this time. Pt able to speak in full and complete sentences with no noted dysarthria/aphasia at this time.

## 2019-12-26 NOTE — ED Notes (Signed)
SLP at bedside at this time to assess patient.

## 2019-12-26 NOTE — ED Notes (Signed)
Occupational therapy at bedside to assess patient.

## 2019-12-27 NOTE — TOC Transition Note (Signed)
Transition of Care [facility]) - Progression Note    Patient Details  Name: KOLE HILYARD MRN: [id number] Date of Birth: February 27, 1966  Transition of Care [facility]) CM/SW [place], [place] Phone Number:  [phone] 12/27/2019, 4:01 PM  Clinical Narrative:     CSW spoke with patient.  He was told by the ED nurse that he would receiving Home Health.  The patient wanted to know when someone would call him.  Pt d/c after midnight on the 20th.and was discontinued form TOC consult list.   CSW stated I would try to find a home health provider and contact him as soon as possible.  Patient would like [facility].        Expected Discharge Plan and Services           Expected Discharge Date: 12/26/19                                     Social Determinants of Health (SDOH) Interventions    Readmission Risk Interventions No flowsheet data found.

## 2020-01-17 ENCOUNTER — Other Ambulatory Visit
Admission: RE | Admit: 2020-01-17 | Discharge: 2020-01-17 | Disposition: A | Discharge status: Home / Self Care | Payer: 59 | Source: Ambulatory Visit | Attending: Cardiology | Admitting: Cardiology

## 2020-01-17 ENCOUNTER — Ambulatory Visit: Payer: 59

## 2020-01-17 ENCOUNTER — Ambulatory Visit: Payer: 59 | Attending: Neurology | Admitting: Occupational Therapy

## 2020-01-17 ENCOUNTER — Encounter: Payer: Self-pay | Admitting: Occupational Therapy

## 2020-01-17 ENCOUNTER — Other Ambulatory Visit: Payer: Self-pay

## 2020-01-17 VITALS — BP 136/86 | HR 75

## 2020-01-17 DIAGNOSIS — R2681 Unsteadiness on feet: Secondary | ICD-10-CM | POA: Diagnosis present

## 2020-01-17 DIAGNOSIS — M6281 Muscle weakness (generalized): Secondary | ICD-10-CM | POA: Insufficient documentation

## 2020-01-17 DIAGNOSIS — Z20822 Contact with and (suspected) exposure to covid-19: Secondary | ICD-10-CM | POA: Insufficient documentation

## 2020-01-17 DIAGNOSIS — R278 Other lack of coordination: Secondary | ICD-10-CM | POA: Insufficient documentation

## 2020-01-17 DIAGNOSIS — Z01812 Encounter for preprocedural laboratory examination: Secondary | ICD-10-CM | POA: Insufficient documentation

## 2020-01-17 DIAGNOSIS — R2689 Other abnormalities of gait and mobility: Secondary | ICD-10-CM

## 2020-01-17 DIAGNOSIS — R29898 Other symptoms and signs involving the musculoskeletal system: Secondary | ICD-10-CM | POA: Insufficient documentation

## 2020-01-17 LAB — SARS CORONAVIRUS 2 (TAT 6-24 HRS): SARS Coronavirus 2: NEGATIVE

## 2020-01-17 NOTE — Therapy (Signed)
[place] MAIN [facility] SERVICES [address] [place], [place], [address] Phone: [phone]   Fax:  [phone]  Physical Therapy Evaluation  Patient Details  Name: Carlos Nolan MRN: [id number] Date of Birth: 1965-08-25 Referring Provider (PT): Anabel Bene   Encounter Date: 01/17/2020   PT End of Session - 01/17/20 1053    Visit Number 1    Number of Visits 17    Date for PT Re-Evaluation 03/13/20    Authorization Type 1/10 eval 01/17/20    PT Start Time 0901    PT Stop Time 1000    PT Time Calculation (min) 59 min    Equipment Utilized During Treatment Gait belt    Activity Tolerance Patient tolerated treatment well    Behavior During Therapy [facility] for tasks assessed/performed           Past Medical History:  Diagnosis Date  . Atrial fibrillation ([place])   . COVID-19 01/2019   Sept 2020    Past Surgical History:  Procedure Laterality Date  . APPENDECTOMY    . CARDIAC ELECTROPHYSIOLOGY STUDY AND ABLATION  04/24/2015  . ELBOW FRACTURE SURGERY    . ESOPHAGOGASTRODUODENOSCOPY N/A 01/11/2016   Procedure: ESOPHAGOGASTRODUODENOSCOPY (EGD);  Surgeon: Mauri Pole, MD;  Location: [facility];  Service: Endoscopy;  Laterality: N/A;  . FEMUR FRACTURE SURGERY    . HARDWARE REMOVAL Left 08/22/2019   Procedure: HARDWARE REMOVAL FROM LEFT ELBOW;  Surgeon: Corky Mull, MD;  Location: [facility];  Service: Orthopedics;  Laterality: Left;  . SHOULDER ARTHROSCOPY Right 06/15/2016   Procedure: Arthroscopic labral debridement, arthroscopic subacromial decompression, and mini-open bursectomy with exploration of rotator cuff, right shoulder;  Surgeon: Corky Mull, MD;  Location: [place];  Service: Orthopedics;  Laterality: Right;    Vitals:   01/17/20 0931  BP: 136/86  Pulse: 75      Subjective Assessment - 01/17/20 0915    Subjective Pt presents to PT for L hemibody weakness and coordination deficits secondary to R CVA.      Pertinent History Patient presents to physical therapy unaccompanied and without AD.  Patient notes L sided weakness and sensory loss secondary to CVA 3 weeks ago. PMH includes atrial fibrillation with successful cardiac ablation in 2017, COVID-19 01/2019, anxiety, s/p ORIF L elbow 07/2019 with subsequent hardware removal 08/2019, appendectomy, esophagogastroduodenoscopy, self-reported history of migraines. Pt presented to [facility] ED 12/24/19 with c/o headache and dizziness. Presented ED again on 12/25/19 with same symptoms with additional numbness of L hemibody. MRI revealed multiple lacunar infarcts near R thalamus and R posterior limb and R external capsule.    Limitations Walking;Standing    How long can you sit comfortably? n/a    How long can you stand comfortably? "Not very long"    How long can you walk comfortably? One block    Patient Stated Goals To play golf again    Currently in Pain? Yes    Pain Score 5     Pain Location Rib cage    Pain Orientation Left    Pain Descriptors / Indicators Aching    Pain Type Acute pain    Pain Onset In the past 7 days    Pain Frequency Constant              [facility] PT Assessment - 01/17/20 0001      Assessment   Medical Diagnosis R CVA    Referring Provider (PT) Anabel Bene  Onset Date/Surgical Date 12/27/19    Hand Dominance Right    Prior Therapy HH PT      Precautions   Precautions None      Restrictions   Weight Bearing Restrictions No      Balance Screen   Has the patient fallen in the past 6 months Yes    How many times? multiple near falls d/t L sided weakness    Is the patient reluctant to leave their home because of a fear of falling?  No      Home Social worker Private residence    Living Arrangements Spouse/significant other    Available Help at Discharge Family    Type of [place] to enter    Entrance Stairs-Number of Steps 4    Entrance Stairs-Rails Can reach both     [place] Two level;Able to live on main level with bedroom/bathroom   exercise room upstairs   Alternate Level Stairs-Number of Steps 15    Alternate Level Stairs-Rails Right    Home Equipment Cane - single point;Grab bars - tub/shower      Prior Function   Level of Independence Independent    Vocation On disability    Vocation Requirements works in Engineer, technical sales, 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[employer], disc golf, walking, tennis      Cognition   Overall Cognitive Status Within Functional Limits for tasks assessed      Observation/Other Assessments   Focus on Therapeutic Outcomes (FOTO)  51      Sensation   Light Touch Impaired by gross assessment      Coordination   Gross Motor Movements are Fluid and Coordinated No      Standardized Balance Assessment   Standardized Balance Assessment Berg Balance Test;Five Times Sit to Stand;10 meter walk test    Five times sit to stand comments  21 seconds    10 Meter Walk 17.37 seconds = 0.45m/sec   Limited Designer, jewellery Test   Sit to Stand Able to stand without using hands and stabilize independently    Standing Unsupported Able to stand safely 2 minutes    Sitting with Back Unsupported but Feet Supported on Floor or Stool Able to sit safely and securely 2 minutes    Stand to Sit Sits safely with minimal use of hands    Transfers Able to transfer safely, minor use of hands    Standing Unsupported with Eyes Closed Able to stand 10 seconds with supervision    Standing Unsupported with Feet Together Able to place feet together independently and stand for 1 minute with supervision    From Standing, Reach Forward with Outstretched Arm Reaches forward but needs supervision    From Standing Position, Pick up Object from Floor Able to pick up shoe, needs supervision    From Standing Position, Turn to Look Behind Over each Shoulder Looks behind one side only/other side shows less weight shift    Turn 360 Degrees Able to turn 360 degrees  safely but slowly    Standing Unsupported, Alternately Place Feet on Step/Stool Able to complete >2 steps/needs minimal assist    Standing Unsupported, One Foot in Front Able to plae foot ahead of the other independently and hold 30 seconds    Standing on One Leg Unable to try or needs assist to prevent fall    Total Score 39  PAIN: L sided pain that began when numbness subsided. 5/10 at this time. Given muscle relaxant by MD 3 days ago. Patient describes as constant ache.  POSTURE: Patient presents with rounded shoulder, forward head posture in sitting. No scoliotic curve or major thoracic kyphosis noted. Patient presents with ~1-2in lean to R. Standing weight acceptance shift onto RLE>LLE.    PROM/AROM: AROM BLE:  Grossly impaired on L; hip flexion, hip extension, knee flexion in standing mobility.  L ankle WFL    STRENGTH:  Graded on a 0-5 scale Muscle Group Left Right  Hip Flex 3+/5 5/5  Hip Abd 3/5 4+/5  Hip Add 2+/5 4+/5  Knee Flex 3+/5 5/5  Knee Ext 4-/5 5/5  Ankle DF 3+/5 5/5  Ankle PF 3+/5 5/5   SENSATION:  BLE :  Grossly impaired on L. Patient notes numbness mostly in L foot with tingling and improved numbness in L calf, shin, thigh.  NEUROLOGICAL SCREEN: (2+ unless otherwise noted.) N=normal  Ab=abnormal   Level Dermatome R L  L2 Medial thigh/groin N Ab  L3 Lower thigh/med.knee N Ab  L4 Medial leg/lat thigh N Ab  L5 Lat. leg & dorsal foot N Ab  S1 post/lat foot/thigh/leg N Ab  S2 Post./med. thigh & leg N Ab    SOMATOSENSORY:  Any N & T in extremities or weakness: reports :         Sensation           Intact      Diminished         Absent  Light touch  LLE                             COORDINATION: Finger to Nose: Intact with pass pointing, dysmetric LUE         Heel Shin Slide Test: Impaired LLE on R shin. Performed at slow speed, poor proprioception    SPECIAL TESTS: Cranial Nerves/Vision: Peripheral field vision intact bilaterally. Patient  wears glasses every day. Able to follow pen without nystagmus/difficulty  FUNCTIONAL MOBILITY: Patient demonstrates inc weight shift on RLE when performing mobility tasks such as sit > stand and picking up objects. PT cues for equal weight shift, resulting in inc time and effort from patient to perform task.   BALANCE: Static Sitting Balance  Normal Able to maintain balance against maximal resistance   Good Able to maintain balance against moderate resistance X  Good-/Fair+ Accepts minimal resistance   Fair Able to sit unsupported without balance loss and without UE support   Poor+ Able to maintain with Minimal assistance from individual or chair   Poor Unable to maintain balance-requires mod/max support from individual or chair    Static Standing Balance  Normal Able to maintain standing balance against maximal resistance   Good Able to maintain standing balance against moderate resistance   Good-/Fair+ Able to maintain standing balance against minimal resistance X  Fair Able to stand unsupported without UE support and without LOB for 1-2 min   Fair- Requires Min A and UE support to maintain standing without loss of balance   Poor+ Requires mod A and UE support to maintain standing without loss of balance   Poor Requires max A and UE support to maintain standing balance without loss    Dynamic Sitting Balance  Normal Able to sit unsupported and weight shift across midline maximally   Good Able to sit unsupported and weight shift across midline  moderately X  Good-/Fair+ Able to sit unsupported and weight shift across midline minimally   Fair Minimal weight shifting ipsilateral/front, difficulty crossing midline   Fair- Reach to ipsilateral side and unable to weight shift   Poor + Able to sit unsupported with min A and reach to ipsilateral side, unable to weight shift   Poor Able to sit unsupported with mod A and reach ipsilateral/front-can't cross midline    Standing Dynamic Balance    Normal Stand independently unsupported, able to weight shift and cross midline maximally   Good Stand independently unsupported, able to weight shift and cross midline moderately   Good-/Fair+ Stand independently unsupported, able to weight shift across midline minimally   Fair Stand independently unsupported, weight shift, and reach ipsilaterally, loss of balance when crossing midline X  Poor+ Able to stand with Min A and reach ipsilaterally, unable to weight shift   Poor Able to stand with Mod A and minimally reach ipsilaterally, unable to cross midline.      GAIT: Patient ambulates with Trendelenburg-style gait pattern on L secondary to sensory deficits with LLE. Patient ambulates at decreased velocity with decreased step length and arm swing bilaterally and NBOS. Patient demonstrates decreased hip flexion bilaterally. Suspect this is due to decreased proprioception of LLE and fear of full foot clearance. Patient maintains 1-2in lean to R throughout gait trial. No overt LOB noted during gait trial.  OUTCOME MEASURES: TEST Outcome Interpretation  5 times sit<>stand 21 sec no UE support >39 yo, >15 sec indicates increased risk for falls  10 meter walk test          0.56       M/s no AD <1.0 m/s indicates increased risk for falls; limited community ambulator  Berg Balance Assessment 39 <36/56 (100% risk for falls), 37-45 (80% risk for falls); 46-51 (>50% risk for falls); 52-55 (lower risk <25% of falls)  FOTO 51%            Objective measurements completed on examination: See above findings.   HEP: performed for demonstration of understanding  Access Code: [email or web address]  URL: [email or web address]  Date: 01/17/2020 Prepared by: Janna Arch  Exercises: Alternating Seated Step Taps - 1 x daily - 7 x weekly - 2 sets - 20 reps - 5 hold  Standing Hip Extension with Counter Support - 1 x daily - 7 x weekly - 2 sets - 10 reps - 5 hold  Standing Weight Shift - 1 x daily - 7  x weekly - 2 sets - 10 reps - 5 hold  Standing Single Leg Stance with Unilateral Counter Support - 1 x daily - 7 x weekly - 2 sets - 2 reps - 30 hold            PT Education - 01/17/20 0956    Education Details Role of therapy, CVA education, breathing technique, HEP review, orthostatics and dizziness    Person(s) Educated Patient    Methods Explanation;Demonstration;Tactile cues;Verbal cues;Handout    Comprehension Verbalized understanding;Returned demonstration;Verbal cues required;Tactile cues required            PT Short Term Goals - 01/17/20 1054      PT SHORT TERM GOAL #1   Title Patient will be independent in home exercise program to improve strength/mobility for better functional independence with ADLs.    Baseline HEP administered and performed at evaluation    Time 4    Period Weeks    Target Date 02/14/20  PT SHORT TERM GOAL #2   Title Patient will demonstrate 4/5 strength with all left lower extremity movements in order to maximize functional independence.    Baseline 9/10: see note    Time 4    Period Weeks    Target Date 02/14/20                 Plan - 01/17/20 1043    Clinical Impression Statement Patient is a pleasant 54 y/o presenting with L hemibody weakness and sensory deficits secondary to R CVA. Patient demonstrates impaired proprioception which affects functional mobility tasks, coordination, and gait. Patient displays midline shift ~1-2 inches to the R throughout sitting and standing activities. Patient classified as limited community ambulator at this time, indicated by gait speed of 0.6m/sec. Patient will benefit from skilled PT to address strength, balance, and coordination impairments in order to maximize functional independence at home.    Personal Factors and Comorbidities Past/Current Experience;Comorbidity 2;Time since onset of injury/illness/exacerbation    Comorbidities Atrial fibrillation, anxiety    Examination-Activity  Limitations Squat;Bend;Carry;Reach Overhead;Locomotion Level;Lift;Stairs;Stand;Transfers    Examination-Participation Restrictions Occupation;Other;Community Activity;Cleaning;Laundry;Meal Prep;Yard Work;Shop   golfing, sporting activities   Stability/Clinical Decision Making Evolving/Moderate complexity    Clinical Decision Making Moderate    Rehab Potential Good    PT Frequency 2x / week    PT Duration 8 weeks    PT Treatment/Interventions Patient/family education;Therapeutic exercise;Functional mobility training;Gait training;Therapeutic activities;Balance training;Neuromuscular re-education;Manual techniques;ADLs/Self Care Home Management;Aquatic Therapy;Canalith Repostioning;Cryotherapy;Moist Heat;DME Instruction;Stair training;Cognitive remediation;Passive range of motion;Energy conservation;Taping;Vestibular;Visual/perceptual remediation/compensation    PT Next Visit Plan DGI, balance training, gait training, midline acceptance    PT Home Exercise Plan see above    Recommended Other Services seeing OT    Consulted and Agree with Plan of Care Patient           Patient will benefit from skilled therapeutic intervention in order to improve the following deficits and impairments:  Abnormal gait, Decreased balance, Difficulty walking, Improper body mechanics, Impaired sensation, Decreased strength, Postural dysfunction, Decreased coordination, Decreased mobility, Cardiopulmonary status limiting activity, Decreased activity tolerance, Decreased endurance, Impaired flexibility, Impaired UE functional use  Visit Diagnosis: Unsteadiness on feet  Weakness of left lower extremity  Other abnormalities of gait and mobility     Problem List Patient Active Problem List   Diagnosis Date Noted  . Stroke (cerebrum) ([place]) 12/26/2019  . Acute thalamic infarction ([place]) 12/25/2019  . S/P ablation of atrial fibrillation 12/25/2019  . Food impaction of esophagus    Tonny Bollman, SPT  This  entire session was performed under direct supervision and direction of a licensed therapist/therapist assistant . I have personally read, edited and approve of the note as written.  Janna Arch, PT, DPT   01/17/2020, 11:39 AM  [place] MAIN [facility] SERVICES [address] [place], [place], [address] Phone: [phone]   Fax:  [phone]  Name: RANIER COACH MRN: [id number] Date of Birth: 01-20-1966

## 2020-01-17 NOTE — Patient Instructions (Signed)
  Access Code: [email or web address]  URL: [email or web address]  Date: 01/17/2020 Prepared by: Janna Arch  Exercises: Alternating Seated Step Taps - 1 x daily - 7 x weekly - 2 sets - 20 reps - 5 hold  Standing Hip Extension with Counter Support - 1 x daily - 7 x weekly - 2 sets - 10 reps - 5 hold  Standing Weight Shift - 1 x daily - 7 x weekly - 2 sets - 10 reps - 5 hold  Standing Single Leg Stance with Unilateral Counter Support - 1 x daily - 7 x weekly - 2 sets - 2 reps - 30 hold

## 2020-01-17 NOTE — Therapy (Signed)
[place] MAIN [facility] SERVICES [address] [place], [place], [address] Phone: [phone]   Fax:  [phone]  Occupational Therapy Evaluation  Patient Details  Name: Carlos Nolan MRN: [id number] Date of Birth: 30-Sep-1965 No data recorded  Encounter Date: 01/17/2020   OT End of Session - 01/17/20 1023    Visit Number 1    Number of Visits 24    Date for OT Re-Evaluation 04/10/20    OT Start Time 1000    OT Stop Time 1059    OT Time Calculation (min) 59 min    Activity Tolerance Patient tolerated treatment well    Behavior During Therapy [facility] for tasks assessed/performed           Past Medical History:  Diagnosis Date   Atrial fibrillation [facility])    COVID-19 01/2019   Sept 2020    Past Surgical History:  Procedure Laterality Date   APPENDECTOMY     CARDIAC ELECTROPHYSIOLOGY STUDY AND ABLATION  04/24/2015   ELBOW FRACTURE SURGERY     ESOPHAGOGASTRODUODENOSCOPY N/A 01/11/2016   Procedure: ESOPHAGOGASTRODUODENOSCOPY (EGD);  Surgeon: Mauri Pole, MD;  Location: [facility];  Service: Endoscopy;  Laterality: N/A;   FEMUR FRACTURE SURGERY     HARDWARE REMOVAL Left 08/22/2019   Procedure: HARDWARE REMOVAL FROM LEFT ELBOW;  Surgeon: Corky Mull, MD;  Location: [facility]S;  Service: Orthopedics;  Laterality: Left;   SHOULDER ARTHROSCOPY Right 06/15/2016   Procedure: Arthroscopic labral debridement, arthroscopic subacromial decompression, and mini-open bursectomy with exploration of rotator cuff, right shoulder;  Surgeon: Corky Mull, MD;  Location: [facility];  Service: Orthopedics;  Laterality: Right;    There were no vitals filed for this visit.   Subjective Assessment - 01/17/20 1004    Subjective  Patient reports having a stroke 3 weeks ago, reports left side of body is numb and is getting some return of sensation.  left side of head and face, left arm and down to foot.  Pt reports he had a bad headache, went  to ER.  The next day had additional symptoms and returned to ER.    Patient Stated Goals Pt reports he would like to be able to golf again, be as independent as possible.    Currently in Pain? Yes    Pain Score 5     Pain Location Rib cage    Pain Orientation Left    Pain Descriptors / Indicators Aching    Pain Type Acute pain    Pain Onset In the past 7 days    Pain Frequency Constant             [facility] OT Assessment - 01/17/20 1010      Assessment   Medical Diagnosis R CVA    Onset Date/Surgical Date 12/27/19    Hand Dominance Right    Prior Therapy HH PT      Precautions   Precautions None      Restrictions   Weight Bearing Restrictions No      Balance Screen   Has the patient fallen in the past 6 months No      Home  Environment   Family/patient expects to be discharged to: Private residence    Living Arrangements Spouse/significant other    Available Help at Discharge Family    Type of [place] Two level    Alternate Level Stairs - Number of  Steps 15    Bathroom Shower/Tub Walk-in Hotel manager Grab bars - tub/shower;[name] - single point    Lives With Spouse      Prior Function   Level of Independence Independent    Vocation Full time employment    Vocation Requirements works in Engineer, technical sales, 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   Difficulty with using left hand to flush the toilet, opening the door, grasping puzzle pieces, performing wood working tasks.  Pt reports requiring increased time to complete self care tasks, difficulty with managing belt .  Had difficulty with putting wallet in pocket and getting cell phone out of pocket but this has been improving.  Patient demonstrates difficulty with cutting meat/food, using keyboard, grasping golf club, squeezing toothpaste and homemaking tasks.  Patient works in Secretary/administrator.       IADL   Prior Level of Function Shopping independence     Shopping Needs to be accompanied on any shopping trip    Prior Level of Function Light Housekeeping independent    Light Housekeeping Needs help with all home maintenance tasks    Prior Level of Function Meal Prep independent    Meal Prep Needs to have meals prepared and served    Prior Level of Function Passenger transport manager own vehicle    Prior Level of Function Medication Managment independent    Medication Management Is responsible for taking medication in correct dosages at correct time    Prior Level of Function Therapist, sports financial matters independently (budgets, writes checks, pays rent, bills goes to bank), collects and keeps track of income      Mobility   Mobility Status Independent      Written Expression   Dominant Hand Right      Vision - History   Baseline Vision Wears glasses all the time      Cognition   Overall Cognitive Status Within Functional Limits for tasks assessed      Observation/Other Assessments   Focus on Therapeutic Outcomes (FOTO)  55      Sensation   Light Touch Appears Intact    Stereognosis Appears Intact    Hot/Cold Appears Intact    Additional Comments numbness and tingling in left hand and arm.       Coordination   9 Hole Peg Test Right;Left    Right 9 Hole Peg Test 25 sec     Left 9 Hole Peg Test 43 sec      AROM   Overall AROM  Deficits    Overall AROM Comments Right shoulder flexion 150 degrees, left 122 degrees.        Strength   Overall Strength Comments right UE 5/5, left UE 4-/5 overall  Hand Function   Right Hand Grip (lbs) 96    Right Hand Lateral Pinch 25 lbs    Right Hand 3 Point Pinch 25 lbs    Left Hand Grip (lbs) 47    Left Hand Lateral Pinch 17 lbs    Left 3 point pinch 14 lbs    Comment 2 point pinch right 15#, left 9#          Impaired rapid alternating movement and finger to nose.   Full opposition of thumb to all digits on left   Patient issued green theraputty and instructed on home program for grip and pinch skills.  Performed 10 reps of gross gripping, lateral pinch, 3 point pinch and 2 point pinch.  Therapist demo and cues.                  OT Education - 01/17/20 2020    Education Details goals, plan of care, putty exercises    Person(s) Educated Patient    Methods Demonstration;Explanation    Comprehension Verbalized understanding;Returned demonstration               OT Long Term Goals - 01/17/20 2024      OT LONG TERM GOAL #1   Title Pt will be independent with home exercise program for strength, ROM and coordination    Baseline will need modifications to current program from home health    Time 12    Status New    Target Date 04/10/20      OT LONG TERM GOAL #2   Title Pt to improve grip by 10# to be able to hold objects without dropping    Baseline left grip 47# at eval    Time 12    Period Weeks    Status New    Target Date 04/10/20      OT LONG TERM GOAL #3   Title Pt will complete cutting and chopping food for meal prep and self feeding with modified independence    Baseline difficulty with cutting food at eval    Time 12    Period Weeks    Status New    Target Date 04/10/20      OT LONG TERM GOAL #4   Title Pt will improve FOTO score by 10 points to show a clinically relevant  change in ability to perform necessary self care and IADL tasks with greater ease during ADL tasks.    Baseline eval FOTO 55    Time 12    Period Weeks    Status New    Target Date 04/10/20      OT LONG TERM GOAL #5   Title Pt will demonstrate use of keyboard with bilateral UEs with good speed and minimal errors.    Baseline difficulty with use of left hand with typing.    Time 12    Period Weeks    Status New    Target Date 04/10/20      Long Term Additional Goals   Additional Long Term Goals Yes      OT LONG TERM GOAL #6   Title Pt will demonstrate secure grip on golf club during all phases of swing.    Baseline eval difficulty with gripping patterns    Time 12    Period Weeks    Status New    Target Date 04/10/20      OT LONG TERM GOAL #7   Title Patient will demonstrate improved grip to squeeze out  toothpaste with left hand with modified independence.    Baseline eval difficulty    Time 6    Period Weeks    Status New    Target Date 02/28/20                 Plan - 01/17/20 2022    Clinical Impression Statement Patient is a 54 yo male diagnosed with CVA on 12/24/2019 who presents with muscle weakness, decreased balance, decreased functional mobility, lack of coordination, decreased ROM/strength, decreased ability to perform self care and IADL tasks including work tasks.  Patient would benefit from skilled OT services to maximize safety and independence in necessary daily tasks.    OT Occupational Profile and History Detailed Assessment- Review of Records and additional review of physical, cognitive, psychosocial history related to current functional performance    Occupational performance deficits (Please refer to evaluation for details): ADL's;Work;IADL's;Leisure;Social Participation    Body Structure / Function / Physical Skills ADL;Dexterity;Flexibility;ROM;Strength;Balance;Coordination;FMC;IADL;UE functional use;GMC    Psychosocial Skills Environmental   Adaptations;Routines and Behaviors;Habits    Rehab Potential Excellent    Clinical Decision Making Limited treatment options, no task modification necessary    Comorbidities Affecting Occupational Performance: May have comorbidities impacting occupational performance    Modification or Assistance to Complete Evaluation  No modification of tasks or assist necessary to complete eval    OT Frequency 2x / week    OT Duration 12 weeks    OT Treatment/Interventions Self-care/ADL training;Cryotherapy;Therapeutic exercise;DME and/or AE instruction;Balance training;Neuromuscular education;Manual Therapy;Moist Heat;Contrast Bath;Therapeutic activities;Patient/family education    Consulted and Agree with Plan of Care Patient           Patient will benefit from skilled therapeutic intervention in order to improve the following deficits and impairments:   Body Structure / Function / Physical Skills: ADL, Dexterity, Flexibility, ROM, Strength, Balance, Coordination, FMC, IADL, UE functional use, GMC   Psychosocial Skills: Environmental  Adaptations, Routines and Behaviors, Habits   Visit Diagnosis: Muscle weakness (generalized)  Other lack of coordination    Problem List Patient Active Problem List   Diagnosis Date Noted   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) 12/25/2019   S/P ablation of atrial fibrillation 12/25/2019   Food impaction of esophagus    Dwan Fennel Oneita Jolly, OTR/L, CLT  Latrease Kunde 01/17/2020, 8:34 PM  [facility] [address] [place], [place], [address] Phone: [phone]   Fax:  [phone]  Name: Carlos Nolan MRN: [id number] Date of Birth: [birth year]

## 2020-01-20 ENCOUNTER — Encounter: Payer: Self-pay | Admitting: Occupational Therapy

## 2020-01-20 ENCOUNTER — Ambulatory Visit: Payer: 59

## 2020-01-20 ENCOUNTER — Other Ambulatory Visit: Payer: Self-pay

## 2020-01-20 ENCOUNTER — Ambulatory Visit: Payer: 59 | Admitting: Occupational Therapy

## 2020-01-20 DIAGNOSIS — R278 Other lack of coordination: Secondary | ICD-10-CM

## 2020-01-20 DIAGNOSIS — M6281 Muscle weakness (generalized): Secondary | ICD-10-CM | POA: Diagnosis not present

## 2020-01-20 DIAGNOSIS — R29898 Other symptoms and signs involving the musculoskeletal system: Secondary | ICD-10-CM

## 2020-01-20 DIAGNOSIS — R2681 Unsteadiness on feet: Secondary | ICD-10-CM

## 2020-01-20 DIAGNOSIS — R2689 Other abnormalities of gait and mobility: Secondary | ICD-10-CM

## 2020-01-20 NOTE — Patient Instructions (Signed)
Access Code: [email or web address]  URL: [email or web address]  Date: 01/20/2020 Prepared by: Janna Arch   Exercises  Seated Flexion Stretch with Swiss Ball - 1 x daily - 7 x weekly - 2 sets - 10 reps - 5 hold  Seated Thoracic Flexion and Rotation with Swiss Ball - 1 x daily - 7 x weekly - 2 sets - 10 reps - 5 hold  Standing 'L' Stretch at Counter - 1 x daily - 7 x weekly - 2 sets - 10 reps - 5 hold  Child's Pose Stretch - 1 x daily - 7 x weekly - 2 sets - 2 reps - 30 hold

## 2020-01-20 NOTE — Therapy (Signed)
[place] MAIN [facility] SERVICES [address] [place], [place], [address] Phone: [phone]   Fax:  [phone]  Physical Therapy Treatment  Patient Details  Name: Carlos Nolan MRN: [id number] Date of Birth: 12/28/1965 Referring Provider (PT): Anabel Bene   Encounter Date: 01/20/2020   PT End of Session - 01/20/20 1158    Visit Number 2    Number of Visits 17    Date for PT Re-Evaluation 03/13/20    Authorization Type 2/10 eval 01/17/20    PT Start Time 1100    PT Stop Time 1144    PT Time Calculation (min) 44 min    Equipment Utilized During Treatment Gait belt    Activity Tolerance Patient tolerated treatment well    Behavior During Therapy [facility] for tasks assessed/performed           Past Medical History:  Diagnosis Date  . Atrial fibrillation ([place])   . COVID-19 01/2019   Sept 2020    Past Surgical History:  Procedure Laterality Date  . APPENDECTOMY    . CARDIAC ELECTROPHYSIOLOGY STUDY AND ABLATION  04/24/2015  . ELBOW FRACTURE SURGERY    . ESOPHAGOGASTRODUODENOSCOPY N/A 01/11/2016   Procedure: ESOPHAGOGASTRODUODENOSCOPY (EGD);  Surgeon: Mauri Pole, MD;  Location: [facility];  Service: Endoscopy;  Laterality: N/A;  . FEMUR FRACTURE SURGERY    . HARDWARE REMOVAL Left 08/22/2019   Procedure: HARDWARE REMOVAL FROM LEFT ELBOW;  Surgeon: Corky Mull, MD;  Location: [facility];  Service: Orthopedics;  Laterality: Left;  . SHOULDER ARTHROSCOPY Right 06/15/2016   Procedure: Arthroscopic labral debridement, arthroscopic subacromial decompression, and mini-open bursectomy with exploration of rotator cuff, right shoulder;  Surgeon: Corky Mull, MD;  Location: [facility];  Service: Orthopedics;  Laterality: Right;    There were no vitals filed for this visit.   Subjective Assessment - 01/20/20 1100    Subjective Patient notes increased activity since last session, as he walked to/from parking lot during football  game. Denies falls or LOB since last visit. Patient reports compliance with HEP and has been incoporating his Wii    Pertinent History Patient presents to physical therapy unaccompanied and without AD.  Patient notes L sided weakness and sensory loss secondary to CVA 3 weeks ago. PMH includes atrial fibrillation with successful cardiac ablation in 2017, COVID-19 01/2019, anxiety, s/p ORIF L elbow 07/2019 with subsequent hardware removal 08/2019, appendectomy, esophagogastroduodenoscopy, self-reported history of migraines. Pt presented to [facility] ED 12/24/19 with c/o headache and dizziness. Presented ED again on 12/25/19 with same symptoms with additional numbness of L hemibody. MRI revealed multiple lacunar infarcts near R thalamus and R posterior limb and R external capsule.    Limitations Walking;Standing    How long can you sit comfortably? n/a    How long can you stand comfortably? "Not very long"    How long can you walk comfortably? One block    Patient Stated Goals To play golf again    Currently in Pain? Yes    Pain Score 4     Pain Location Rib cage    Pain Orientation Left    Pain Descriptors / Indicators Aching    Pain Type Acute pain    Pain Onset In the past 7 days    Pain Frequency Constant                   Treatment: - Octane 4 minutes @ level 4 for BLE and BUE coordination  avg RPM 54 - Sit to Stand with 6" step under RLE to promote weight shift onto LLE. 15x 2 sets - Contrakicks w/ hands on bar to hip strengthening and balance in unilateral stance. Red band around ankles 10x each direction (forward, lateral, backwards) each leg (for 30x total)  - Weight shifts M/L o Golf swings with red resistance band. 10x forward. Pt notes pain in L rib cage and L back during follow through.       -     Tandem balance 30 seconds each foot. Mirror in front for midline awareness. CGA for safety.       -     Progressed to normal BOS, NBOS, EC on Airex pad. 30 seconds each position with  mirror in front for midline awareness. CGA for safety.  - QL stretch @ wall 30 seconds x 2; cues for positioning of body for optimal stretch, pain in L arm/scapula limit full lengthening of musculature  - Ball roll outs 10x with 10 second holds, forward and lateral (5x lateral) - Child's pose stretch 30 seconds x 2. Cues and adjustment for body mechanics, placement of arms under body and pushing back with hips for focus on back stretching rather than shoulder. Patient c/o discomfort in L knee    HEP administered to alleviate back pain. Pt demonstrates exercises with PT verbal and tactile cueing: Access Code: [email or web address]  URL: [email or web address]  Date: 01/20/2020 Prepared by: Janna Arch   Exercises  Seated Flexion Stretch with Swiss Ball - 1 x daily - 7 x weekly - 2 sets - 10 reps - 5 hold  Seated Thoracic Flexion and Rotation with Swiss Ball - 1 x daily - 7 x weekly - 2 sets - 10 reps - 5 hold  Standing 'L' Stretch at Counter - 1 x daily - 7 x weekly - 2 sets - 10 reps - 5 hold  Child's Pose Stretch - 1 x daily - 7 x weekly - 2 sets - 2 reps - 30 hold    SPT monitored vitals throughout session to ensure therapeutic range.   Pt educated throughout session about proper posture and technique with exercises. Improved exercise technique, movement at target joints, use of target muscles after min to mod verbal, visual, tactile cues.             PT Education - 01/20/20 1156    Education Details breathing technique, HEP review, body mechanics    Person(s) Educated Patient    Methods Explanation;Demonstration;Tactile cues;Verbal cues;Handout    Comprehension Verbalized understanding;Returned demonstration;Verbal cues required;Tactile cues required            PT Short Term Goals - 01/17/20 1054      PT SHORT TERM GOAL #1   Title Patient will be independent in home exercise program to improve strength/mobility for better functional independence with ADLs.     Baseline HEP administered and performed at evaluation    Time 4    Period Weeks    Target Date 02/14/20      PT SHORT TERM GOAL #2   Title Patient will demonstrate 4/5 strength with all left lower extremity movements in order to maximize functional independence.    Baseline 9/10: see note    Time 4    Period Weeks    Target Date 02/14/20             PT Long Term Goals - 01/17/20 1058      PT LONG TERM GOAL #1  Title Patient will increase FOTO score to equal to or greater than 70% to demonstrate statistically significant improvement in mobility and quality of life.    Baseline 9/10 51%    Time 8    Period Weeks    Status New    Target Date 03/13/20      PT LONG TERM GOAL #2   Title Patient (< 84 years old) will complete five times sit to stand test in < 15 seconds with equal weight shift indicating an increased LE strength and improved balance.    Baseline 9/10 21 seconds    Time 8    Period Weeks    Status New    Target Date 03/13/20      PT LONG TERM GOAL #3   Title Patient will tolerate 5 seconds of single leg stance without loss of balance to improve ability to get in and out of shower safely.    Baseline 9/10 Unable to perform    Time 8    Period Weeks    Status New    Target Date 03/13/20      PT LONG TERM GOAL #4   Title Patient will increase 10 meter walk test to >1.87m/s with midline awareness as to improve gait speed for better community ambulation and to reduce fall risk.    Baseline 9/10 0.36m/s with R lean 1-2 inches    Time 8    Period Weeks    Status New    Target Date 03/13/20      PT LONG TERM GOAL #5   Title Patient will demonstrate an improved Berg Balance Score of >45 as to demonstrate improved stability with ADLs such as sitting/standing and transfer balance and reduced fall risk.    Baseline 9/10: 39/56    Time 8    Period Weeks    Status New    Target Date 03/13/20                 Plan - 01/20/20 1204    Clinical Impression  Statement Patient demonstrates improved midline awareness this session and is able to self-correct when looking in mirror. No overt LOB throughout session. Patient continues to note numbness and weakness in LLE during exercise. Back stretches limited by L scapular discomfort this date. Patient will benefit from skilled PT to address strength, balance, and coordination impairments in order to maximize functional independence at home.    Personal Factors and Comorbidities Past/Current Experience;Comorbidity 2;Time since onset of injury/illness/exacerbation    Comorbidities Atrial fibrillation, anxiety    Examination-Activity Limitations Squat;Bend;Carry;Reach Overhead;Locomotion Level;Lift;Stairs;Stand;Transfers    Examination-Participation Restrictions Occupation;Other;Community Activity;Cleaning;Laundry;Meal Prep;Yard Work;Shop   golfing, sporting activities   Stability/Clinical Decision Making Evolving/Moderate complexity    Rehab Potential Good    PT Frequency 2x / week    PT Duration 8 weeks    PT Treatment/Interventions Patient/family education;Therapeutic exercise;Functional mobility training;Gait training;Therapeutic activities;Balance training;Neuromuscular re-education;Manual techniques;ADLs/Self Care Home Management;Aquatic Therapy;Canalith Repostioning;Cryotherapy;Moist Heat;DME Instruction;Stair training;Cognitive remediation;Passive range of motion;Energy conservation;Taping;Vestibular;Visual/perceptual remediation/compensation    PT Next Visit Plan balance/gait training, LE strengthening    PT Home Exercise Plan see above    Consulted and Agree with Plan of Care Patient           Patient will benefit from skilled therapeutic intervention in order to improve the following deficits and impairments:  Abnormal gait, Decreased balance, Difficulty walking, Improper body mechanics, Impaired sensation, Decreased strength, Postural dysfunction, Decreased coordination, Decreased mobility,  Cardiopulmonary status limiting activity, Decreased activity tolerance, Decreased  endurance, Impaired flexibility, Impaired UE functional use  Visit Diagnosis: Unsteadiness on feet  Weakness of left lower extremity  Other abnormalities of gait and mobility     Problem List Patient Active Problem List   Diagnosis Date Noted  . 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 Acute thalamic infarction ([place]) 12/25/2019  . S/P ablation of atrial fibrillation 12/25/2019  . Food impaction of esophagus    Tonny Bollman, SPT This entire session was performed under direct supervision and direction of a licensed therapist/therapist assistant . I have personally read, edited and approve of the note as written.  Janna Arch, PT, DPT   01/20/2020, 1:26 PM  [place] MAIN [facility] SERVICES [address] [place], [place], [address] Phone: [phone]   Fax:  [phone]  Name: Carlos Nolan MRN: [id number] Date of Birth: 01-10-1966

## 2020-01-21 ENCOUNTER — Ambulatory Visit
Admission: RE | Admit: 2020-01-21 | Discharge: 2020-01-21 | Disposition: A | Discharge status: Home / Self Care | Payer: 59 | Attending: Cardiology | Admitting: Cardiology

## 2020-01-21 ENCOUNTER — Encounter
Admission: RE | Disposition: A | Discharge status: Home / Self Care | Payer: Self-pay | Source: Home / Self Care | Attending: Cardiology

## 2020-01-21 ENCOUNTER — Other Ambulatory Visit: Payer: Self-pay

## 2020-01-21 ENCOUNTER — Encounter: Payer: Self-pay | Admitting: Cardiology

## 2020-01-21 DIAGNOSIS — I4891 Unspecified atrial fibrillation: Secondary | ICD-10-CM | POA: Diagnosis present

## 2020-01-21 HISTORY — PX: LOOP RECORDER INSERTION: EP1214

## 2020-01-21 SURGERY — LOOP RECORDER INSERTION
Anesthesia: LOCAL

## 2020-01-21 MED ORDER — LIDOCAINE-EPINEPHRINE (PF) 1 %-1:200000 IJ SOLN
INTRAMUSCULAR | Status: DC | PRN
  Administered 2020-01-21: 20 mL

## 2020-01-21 MED ORDER — LIDOCAINE-EPINEPHRINE (PF) 1 %-1:200000 IJ SOLN
INTRAMUSCULAR | Status: AC
  Filled 2020-01-21: qty 20

## 2020-01-21 SURGICAL SUPPLY — 2 items
LOOP REVEAL LINQSYS (Prosthesis & Implant Heart) ×2 IMPLANT
PACK LOOP INSERTION (CUSTOM PROCEDURE TRAY) ×2 IMPLANT

## 2020-01-21 NOTE — Discharge Instructions (Signed)
Implantable Loop Recorder Placement, Care After This sheet gives you information about how to care for yourself after your procedure. Your health care provider may also give you more specific instructions. If you have problems or questions, contact your health care provider. What can I expect after the procedure? After the procedure, it is common to have:  Soreness or discomfort near the incision.  Some swelling or bruising near the incision. Follow these instructions at home: Incision care   Follow instructions from your health care provider about how to take care of your incision. Make sure you: ? Wash your hands with soap and water before you change your bandage (dressing). If soap and water are not available, use hand sanitizer. ? Change your dressing as told by your health care provider. ? Keep your dressing dry. ? Leave stitches (sutures), skin glue, or adhesive strips in place. These skin closures may need to stay in place for 2 weeks or longer. If adhesive strip edges start to loosen and curl up, you may trim the loose edges. Do not remove adhesive strips completely unless your health care provider tells you to do that.  Check your incision area every day for signs of infection. Check for: ? Redness, swelling, or pain. ? Fluid or blood. ? Warmth. ? Pus or a bad smell.  Do not take baths, swim, or use a hot tub until your health care provider approves. Ask your health care provider if you can take showers. Activity   Return to your normal activities as told by your health care provider. Ask your health care provider what activities are safe for you.  Do not drive for 24 hours if you were given a sedative during your procedure. General instructions  Follow instructions from your health care provider about how to manage your implantable loop recorder and transmit the information. Learn how to activate a recording if this is necessary for your type of device.  Do not go through  a metal detection gate, and do not let someone hold a metal detector over your chest. Show your ID card.  Do not have an MRI unless you check with your health care provider first.  Take over-the-counter and prescription medicines only as told by your health care provider.  Keep all follow-up visits as told by your health care provider. This is important. Contact a health care provider if:  You have redness, swelling, or pain around your incision.  You have a fever.  You have pain that is not relieved by your pain medicine.  You have triggered your device because of fainting (syncope) or because of a heartbeat that feels like it is racing, slow, fluttering, or skipping (palpitations). Get help right away if you have:  Chest pain.  Difficulty breathing. Summary  After the procedure, it is common to have soreness or discomfort near the incision.  Change your dressing as told by your health care provider.  Follow instructions from your health care provider about how to manage your implantable loop recorder and transmit the information.  Keep all follow-up visits as told by your health care provider. This is important. This information is not intended to replace advice given to you by your health care provider. Make sure you discuss any questions you have with your health care provider. Document Revised: 06/10/2017 Document Reviewed: 06/10/2017 Elsevier Patient Education  2020 [employer].

## 2020-01-22 ENCOUNTER — Ambulatory Visit: Payer: 59 | Admitting: Occupational Therapy

## 2020-01-22 ENCOUNTER — Ambulatory Visit: Payer: 59

## 2020-01-22 DIAGNOSIS — M6281 Muscle weakness (generalized): Secondary | ICD-10-CM | POA: Diagnosis not present

## 2020-01-22 DIAGNOSIS — R2681 Unsteadiness on feet: Secondary | ICD-10-CM

## 2020-01-22 DIAGNOSIS — R29898 Other symptoms and signs involving the musculoskeletal system: Secondary | ICD-10-CM

## 2020-01-22 DIAGNOSIS — R2689 Other abnormalities of gait and mobility: Secondary | ICD-10-CM

## 2020-01-22 DIAGNOSIS — R278 Other lack of coordination: Secondary | ICD-10-CM

## 2020-01-22 NOTE — Therapy (Signed)
[place] MAIN [place] [facility] SERVICES [address] [place], [place], [address] Phone: [phone]   Fax:  [phone]  Physical Therapy Treatment  Patient Details  Name: Carlos Nolan MRN: [id number] Date of Birth: 1966-02-19 Referring Provider (PT): Anabel Bene   Encounter Date: 01/22/2020   PT End of Session - 01/22/20 1150    Visit Number 3    Number of Visits 17    Date for PT Re-Evaluation 03/13/20    Authorization Type 3/10 eval 01/17/20    PT Start Time 1101    PT Stop Time 1145    PT Time Calculation (min) 44 min    Equipment Utilized During Treatment Gait belt    Activity Tolerance Patient tolerated treatment well    Behavior During Therapy [facility] for tasks assessed/performed           Past Medical History:  Diagnosis Date  . Atrial fibrillation ([place])   . COVID-19 01/2019   Sept 2020    Past Surgical History:  Procedure Laterality Date  . APPENDECTOMY    . CARDIAC ELECTROPHYSIOLOGY STUDY AND ABLATION  04/24/2015  . ELBOW FRACTURE SURGERY    . ESOPHAGOGASTRODUODENOSCOPY N/A 01/11/2016   Procedure: ESOPHAGOGASTRODUODENOSCOPY (EGD);  Surgeon: Mauri Pole, MD;  Location: [facility];  Service: Endoscopy;  Laterality: N/A;  . FEMUR FRACTURE SURGERY    . HARDWARE REMOVAL Left 08/22/2019   Procedure: HARDWARE REMOVAL FROM LEFT ELBOW;  Surgeon: Corky Mull, MD;  Location: [facility];  Service: Orthopedics;  Laterality: Left;  . LOOP RECORDER INSERTION N/A 01/21/2020   Procedure: LOOP RECORDER INSERTION;  Surgeon: Isaias Cowman, MD;  Location: [place] [facility];  Service: Cardiovascular;  Laterality: N/A;  . SHOULDER ARTHROSCOPY Right 06/15/2016   Procedure: Arthroscopic labral debridement, arthroscopic subacromial decompression, and mini-open bursectomy with exploration of rotator cuff, right shoulder;  Surgeon: Corky Mull, MD;  Location: [place];  Service: Orthopedics;  Laterality: Right;    There  were no vitals filed for this visit.   Subjective Assessment - 01/22/20 1054    Subjective Patient had loop recorder inserted by cardiology 01/21/20. Denies falls or LOB since last session. He has been performing back stretches from last session with improvement in pain.    Pertinent History Patient presents to physical therapy unaccompanied and without AD.  Patient notes L sided weakness and sensory loss secondary to CVA 3 weeks ago. PMH includes atrial fibrillation with successful cardiac ablation in 2017, COVID-19 01/2019, anxiety, s/p ORIF L elbow 07/2019 with subsequent hardware removal 08/2019, appendectomy, esophagogastroduodenoscopy, self-reported history of migraines. Pt [facility] to [facility] ED 12/24/19 with c/o headache and dizziness. Presented ED again on 12/25/19 with same symptoms with additional numbness of L hemibody. MRI revealed multiple lacunar infarcts near R thalamus and R posterior limb and R external capsule.    Limitations Walking;Standing    How long can you sit comfortably? n/a    How long can you stand comfortably? "Not very long"    How long can you walk comfortably? One block    Patient Stated Goals To play golf again    Currently in Pain? Yes    Pain Score 3     Pain Location Rib cage    Pain Orientation Left    Pain Descriptors / Indicators Aching    Pain Type Chronic pain    Pain Onset In the past 7 days    Pain Frequency Constant  Treatment: - NuStep 4 minutes, level 4 for BLE and BUE coordination.  - Sit to Stand w/o UE support with 4" step under RLE to promote weight shift onto LLE. 12x 2 sets  - Mini squats with chair behind for safety. 4" step placed under RLE to promote weight acceptance on LLE. 12 x 2 sets. PT cues for weight shift onto LLE, as pt continues to favor R side during exercise.  - Progressed to single leg squat from raised plinth table. Patient performs with LLE only. SPT provides CGA for safety with PT in front to offer HHA. 10  x 2 sets  -   Contrakicks w/ one hand on bar for hip strengthening and balance in unilateral stance. Red band around ankles 10x each direction (forward, lateral, backwards) each leg (for 30x total)     Standing in // Bars: - Monster walks 4x. SPT cues to sustain slight knee flexion throughout task. Patient reports slight pain/discomfort in affected LE  - Side step 4x. SPT cues to sustain slight knee flexion throughout task. - Ball kicks for coordination, sequencing and timing of muscle activation, single leg stabilization against pertubation's.  12x each leg. CGA for safety. Patient uses no UE support to further challenge balance.   - On Airex pad: step up and over with one foot staying on pad. Performed 12x each leg with CGA for safety. SPT cues for pacing during task to target muscle activation.       -     On Airex pad: side stepping onto/off of pad. 10x each direction (20x total)      SPT monitored vitals throughout session to ensure safe therapeutic ranges      Pt educated throughout session about proper posture and technique with exercises. Improved exercise technique, movement at target joints, use of target muscles after min to mod verbal, visual, tactile cues.           PT Education - 01/22/20 1050    Education Details body mechanics, exercise technique, breathing    Person(s) Educated Patient    Methods Explanation;Demonstration;Tactile cues;Verbal cues    Comprehension Verbalized understanding;Returned demonstration;Verbal cues required;Tactile cues required            PT Short Term Goals - 01/17/20 1054      PT SHORT TERM GOAL #1   Title Patient will be independent in home exercise program to improve strength/mobility for better functional independence with ADLs.    Baseline HEP administered and performed at evaluation    Time 4    Period Weeks    Target Date 02/14/20      PT SHORT TERM GOAL #2   Title Patient will demonstrate 4/5 strength with all  left lower extremity movements in order to maximize functional independence.    Baseline 9/10: see note    Time 4    Period Weeks    Target Date 02/14/20             PT Long Term Goals - 01/17/20 1058      PT LONG TERM GOAL #1   Title Patient will increase FOTO score to equal to or greater than 70% to demonstrate statistically significant improvement in mobility and quality of life.    Baseline 9/10 51%    Time 8    Period Weeks    Status New    Target Date 03/13/20      PT LONG TERM GOAL #2   Title Patient (< 53 years old) will  complete five times sit to stand test in < 15 seconds with equal weight shift indicating an increased LE strength and improved balance.    Baseline 9/10 21 seconds    Time 8    Period Weeks    Status New    Target Date 03/13/20      PT LONG TERM GOAL #3   Title Patient will tolerate 5 seconds of single leg stance without loss of balance to improve ability to get in and out of shower safely.    Baseline 9/10 Unable to perform    Time 8    Period Weeks    Status New    Target Date 03/13/20      PT LONG TERM GOAL #4   Title Patient will increase 10 meter walk test to >1.71m/s with midline awareness as to improve gait speed for better community ambulation and to reduce fall risk.    Baseline 9/10 0.69m/s with R lean 1-2 inches    Time 8    Period Weeks    Status New    Target Date 03/13/20      PT LONG TERM GOAL #5   Title Patient will demonstrate an improved Berg Balance Score of >45 as to demonstrate improved stability with ADLs such as sitting/standing and transfer balance and reduced fall risk.    Baseline 9/10: 39/56    Time 8    Period Weeks    Status New    Target Date 03/13/20                 Plan - 01/22/20 1200    Clinical Impression Statement Patient demonstrates improved balance this session, displaying minimal LOB during interventions. Patient continues to show preference to R side during sit to stand tasks, requiring  verbal and tactile cueing to promote equal weight distribution. Patient notes discomfort in L knee during monster walks. Suspect decreased knee control in diagonal movement patterns. Patient has improved pain levels this session, but twisting exercises deferred to minimize further exacerbation. Patient will benefit from skilled PT to address strength, balance, and coordination impairments in order to maximize functional independence at home.    Personal Factors and Comorbidities Past/Current Experience;Comorbidity 2;Time since onset of injury/illness/exacerbation    Comorbidities Atrial fibrillation, anxiety    Examination-Activity Limitations Squat;Bend;Carry;Reach Overhead;Locomotion Level;Lift;Stairs;Stand;Transfers    Examination-Participation Restrictions Occupation;Other;Community Activity;Cleaning;Laundry;Meal Prep;Yard Work;Shop   golfing, sporting activities   Stability/Clinical Decision Making Evolving/Moderate complexity    Rehab Potential Good    PT Frequency 2x / week    PT Duration 8 weeks    PT Treatment/Interventions Patient/family education;Therapeutic exercise;Functional mobility training;Gait training;Therapeutic activities;Balance training;Neuromuscular re-education;Manual techniques;ADLs/Self Care Home Management;Aquatic Therapy;Canalith Repostioning;Cryotherapy;Moist Heat;DME Instruction;Stair training;Cognitive remediation;Passive range of motion;Energy conservation;Taping;Vestibular;Visual/perceptual remediation/compensation    PT Next Visit Plan balance/gait training, LE strengthening    PT Home Exercise Plan see above    Consulted and Agree with Plan of Care Patient           Patient will benefit from skilled therapeutic intervention in order to improve the following deficits and impairments:  Abnormal gait, Decreased balance, Difficulty walking, Improper body mechanics, Impaired sensation, Decreased strength, Postural dysfunction, Decreased coordination, Decreased  mobility, Cardiopulmonary status limiting activity, Decreased activity tolerance, Decreased endurance, Impaired flexibility, Impaired UE functional use  Visit Diagnosis: Unsteadiness on feet  Weakness of left lower extremity  Other abnormalities of gait and mobility     Problem List Patient Active Problem List   Diagnosis Date Noted  . 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 Acute thalamic infarction ([place]) 12/25/2019  . S/P ablation of atrial fibrillation 12/25/2019  . Food impaction of esophagus    Tonny Bollman, SPT This entire session was performed under direct supervision and direction of a licensed therapist/therapist assistant . I have personally read, edited and approve of the note as written.  Janna Arch, PT, DPT   01/22/2020, 12:03 PM  [place] MAIN [facility] SERVICES [address] [place], [place], [address] Phone: [phone]   Fax:  [phone]  Name: Carlos Nolan MRN: [id number] Date of Birth: 1966/02/06

## 2020-01-24 ENCOUNTER — Encounter: Payer: Self-pay | Admitting: Occupational Therapy

## 2020-01-24 NOTE — Therapy (Signed)
[place] MAIN [facility] SERVICES [address] [place], [place], [address] Phone: [phone]   Fax:  [phone]  Occupational Therapy Treatment  Patient Details  Name: Carlos Nolan MRN: [id number] Date of Birth: 13-Nov-1965 No data recorded  Encounter Date: 01/20/2020   OT End of Session - 01/23/20 1205    Visit Number 2    Number of Visits 24    Date for OT Re-Evaluation 04/10/20    OT Start Time 1015    OT Stop Time 1100    OT Time Calculation (min) 45 min    Activity Tolerance Patient tolerated treatment well    Behavior During Therapy [facility] for tasks assessed/performed           Past Medical History:  Diagnosis Date  . Atrial fibrillation ([place])   . COVID-19 01/2019   Sept 2020    Past Surgical History:  Procedure Laterality Date  . APPENDECTOMY    . CARDIAC ELECTROPHYSIOLOGY STUDY AND ABLATION  04/24/2015  . ELBOW FRACTURE SURGERY    . ESOPHAGOGASTRODUODENOSCOPY N/A 01/11/2016   Procedure: ESOPHAGOGASTRODUODENOSCOPY (EGD);  Surgeon: Mauri Pole, MD;  Location: [facility];  Service: Endoscopy;  Laterality: N/A;  . FEMUR FRACTURE SURGERY    . HARDWARE REMOVAL Left 08/22/2019   Procedure: HARDWARE REMOVAL FROM LEFT ELBOW;  Surgeon: Corky Mull, MD;  Location: [facility];  Service: Orthopedics;  Laterality: Left;  . LOOP RECORDER INSERTION N/A 01/21/2020   Procedure: LOOP RECORDER INSERTION;  Surgeon: Isaias Cowman, MD;  Location: [place] [facility];  Service: Cardiovascular;  Laterality: N/A;  . SHOULDER ARTHROSCOPY Right 06/15/2016   Procedure: Arthroscopic labral debridement, arthroscopic subacromial decompression, and mini-open bursectomy with exploration of rotator cuff, right shoulder;  Surgeon: Corky Mull, MD;  Location: [place];  Service: Orthopedics;  Laterality: Right;    There were no vitals filed for this visit.   Subjective Assessment - 01/23/20 1205    Subjective  No pain noted this  date.    Patient Stated Goals Pt reports he would like to be able to golf again, be as independent as possible.    Currently in Pain? No/denies    Pain Score 0-No pain          Patient reports he has been using his left hand more with tasks, intentional efforts.  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  Went to [employer] yesterday, difficulty with walking the distance and did not have to do a lot of stairs to get to seats.    Patient seen for strengthening of left hand for sustained grip with use of jumbo pegs and hand gripper 4th setting, 23# for one set of 20 reps followed by increase of resistance to 5th setting, 27# for one set of 20 reps.  Therapist demo and cues for sustained gripping patterns to hold object to move from one place to the next.    Neuromuscular Reeducation:   Patient seen for manipulation of small objects 1/2 to 1 inch in size to pick up, use translatory movements of the hand and then use the hand for storage on the left.  Patient dropping items occasionally, cues at times for tip to tip prehension patterns.     Patient instructed on functional hand exercises for HEP, issued written program with ideas for daily participation.  Response to tx: Patient seen for initial treatment for restoration of  left hand strength and function. Able to perform well with sustained grip strengthening tasks to work on holding items with a more secure gripping pattern, cues for where to hold hand gripper for optimal results.  Dropping items occasionally and continues to demonstrate ataxic movements when moving items from one location to another with reach.  Slower to manipulate small objects and working towards translatory movements of the hand.  Continue to work towards goals in plan of care to improve left hand function for necessary daily tasks at home, work and the community.                         OT Education - 01/24/20 1205    Education Details grip strengthening, [facility]    Person(s) Educated Patient    Methods Demonstration;Explanation    Comprehension Verbalized understanding;Returned demonstration               OT Long Term Goals - 01/17/20 2024      OT LONG TERM GOAL #1   Title Pt will be independent with home exercise program for strength, ROM and coordination    Baseline will need modifications to current program from home health    Time 12    Status New    Target Date 04/10/20      OT LONG TERM GOAL #2   Title Pt to improve grip by 10# to be able to hold objects without dropping    Baseline left grip 47# at eval    Time 12    Period Weeks    Status New    Target Date 04/10/20      OT LONG TERM GOAL #3   Title Pt will complete cutting and chopping food for meal prep and self feeding with modified independence    Baseline difficulty with cutting food at eval    Time 12    Period Weeks    Status New    Target Date 04/10/20      OT LONG TERM GOAL #4   Title Pt will improve FOTO score by 10 points to show a clinically relevant change in ability to perform necessary self care and IADL tasks with greater ease during ADL tasks.    Baseline eval FOTO 55    Time 12    Period Weeks    Status New    Target Date 04/10/20      OT LONG TERM GOAL #5   Title Pt will demonstrate use of keyboard with bilateral UEs with good speed and minimal errors.    Baseline difficulty with use of left hand with typing.    Time 12    Period Weeks    Status New    Target Date 04/10/20      Long Term Additional Goals   Additional Long Term Goals Yes      OT LONG TERM GOAL #6   Title Pt will demonstrate secure grip on golf club during all phases of swing.    Baseline eval difficulty with gripping patterns    Time 12    Period Weeks    Status New    Target Date 04/10/20      OT LONG TERM GOAL #7   Title Patient will demonstrate  improved grip to squeeze out toothpaste with left hand with modified independence.    Baseline eval difficulty    Time 6    Period Weeks    Status New    Target  Date 02/28/20                 Plan - 01/23/20 1206    Clinical Impression Statement Patient seen for initial treatment for restoration of left hand strength and function. Able to perform well with sustained grip strengthening tasks to work on holding items with a more secure gripping pattern, cues for where to hold hand gripper for optimal results.  Dropping items occasionally and continues to demonstrate ataxic movements when moving items from one location to another with reach.  Slower to manipulate small objects and working towards translatory movements of the hand.  Continue to work towards goals in plan of care to improve left hand function for necessary daily tasks at home, work and the community.    OT Occupational Profile and History Detailed Assessment- Review of Records and additional review of physical, cognitive, psychosocial history related to current functional performance    Occupational performance deficits (Please refer to evaluation for details): ADL's;Work;IADL's;Leisure;Social Participation    Body Structure / Function / Physical Skills ADL;Dexterity;Flexibility;ROM;Strength;Balance;Coordination;FMC;IADL;UE functional use;GMC    Psychosocial Skills Environmental  Adaptations;Routines and Behaviors;Habits    Rehab Potential Excellent    Clinical Decision Making Limited treatment options, no task modification necessary    Comorbidities Affecting Occupational Performance: May have comorbidities impacting occupational performance    Modification or Assistance to Complete Evaluation  No modification of tasks or assist necessary to complete eval    OT Frequency 2x / week    OT Duration 12 weeks    OT Treatment/Interventions Self-care/ADL training;Cryotherapy;Therapeutic exercise;DME and/or AE instruction;Balance  training;Neuromuscular education;Manual Therapy;Moist Heat;Contrast Bath;Therapeutic activities;Patient/family education    Consulted and Agree with Plan of Care Patient           Patient will benefit from skilled therapeutic intervention in order to improve the following deficits and impairments:   Body Structure / Function / Physical Skills: ADL, Dexterity, Flexibility, ROM, Strength, Balance, Coordination, FMC, IADL, UE functional use, GMC   Psychosocial Skills: Environmental  Adaptations, Routines and Behaviors, Habits   Visit Diagnosis: Muscle weakness (generalized)  Other lack of coordination    Problem List Patient Active Problem List   Diagnosis Date Noted  . Stroke (cerebrum) ([place]) 12/26/2019  . Acute thalamic infarction ([place]) 12/25/2019  . S/P ablation of atrial fibrillation 12/25/2019  . Food impaction of esophagus    Cuca Benassi Oneita Jolly, OTR/L, CLT  Simon Aaberg 01/24/2020, 12:17 PM  [place] MAIN [facility] SERVICES [address] [place], [place], [address] Phone: [phone]   Fax:  [phone]  Name: Carlos Nolan MRN: [id number] Date of Birth: 1965/07/24

## 2020-01-25 ENCOUNTER — Encounter: Payer: Self-pay | Admitting: Occupational Therapy

## 2020-01-25 NOTE — Therapy (Signed)
[place] MAIN [facility] SERVICES [address] [place], [place], [address] Phone: [phone]   Fax:  [phone]  Occupational Therapy Treatment  Patient Details  Name: Carlos Nolan MRN: [id number] Date of Birth: 1966/02/06 No data recorded  Encounter Date: 01/22/2020   OT End of Session - 01/25/20 2050    Visit Number 3    Number of Visits 24    Date for OT Re-Evaluation 04/10/20    OT Start Time 1015    OT Stop Time 1059    OT Time Calculation (min) 44 min    Activity Tolerance Patient tolerated treatment well    Behavior During Therapy [facility] for tasks assessed/performed           Past Medical History:  Diagnosis Date  . Atrial fibrillation ([place])   . COVID-19 01/2019   Sept 2020    Past Surgical History:  Procedure Laterality Date  . APPENDECTOMY    . CARDIAC ELECTROPHYSIOLOGY STUDY AND ABLATION  04/24/2015  . ELBOW FRACTURE SURGERY    . ESOPHAGOGASTRODUODENOSCOPY N/A 01/11/2016   Procedure: ESOPHAGOGASTRODUODENOSCOPY (EGD);  Surgeon: Mauri Pole, MD;  Location: [facility];  Service: Endoscopy;  Laterality: N/A;  . FEMUR FRACTURE SURGERY    . HARDWARE REMOVAL Left 08/22/2019   Procedure: HARDWARE REMOVAL FROM LEFT ELBOW;  Surgeon: Corky Mull, MD;  Location: [facility];  Service: Orthopedics;  Laterality: Left;  . LOOP RECORDER INSERTION N/A 01/21/2020   Procedure: LOOP RECORDER INSERTION;  Surgeon: Isaias Cowman, MD;  Location: [facility];  Service: Cardiovascular;  Laterality: N/A;  . SHOULDER ARTHROSCOPY Right 06/15/2016   Procedure: Arthroscopic labral debridement, arthroscopic subacromial decompression, and mini-open bursectomy with exploration of rotator cuff, right shoulder;  Surgeon: Corky Mull, MD;  Location: [facility];  Service: Orthopedics;  Laterality: Right;    There were no vitals filed for this visit.   Subjective Assessment - 01/24/20 2048    Subjective  No pain noted this  date.  Has been working on things at home with small object, coins, nuts/bolts, puzzle pieces.    Patient Stated Goals Pt reports he would like to be able to golf again, be as independent as possible.    Currently in Pain? No/denies    Pain Score 0-No pain               Pt report he has been working on things at home, puzzles, coins, nuts/bolts, wife helping him with puzzles.  Neuromuscular Reeducation: Patient seen for focus on use of [place] discs this date for placing into grid, turning and flipping with left hand. Simultaneous flipping with both hands. Stacking discs 4 at a time with left hand to place back into container, cues for technique at times, manipulation skills. Coins with resistive bank, picking up one at a time, progressing to translatory movements of the hand with cues and using the hand for storage with cues.  Seen for use of Cards for shuffling, dealing, flipping and turning, patient demonstrates decrease in speed of task and accuracy but improving.  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  Decreased speed, dexterity and accuracy of hand skills but has been improving.  Patient is working at home on exercises and manipulation skills on a daily basis which helps with consistency.  Continue to work towards goals in plan of care to improve functional hand use  for necessary daily tasks.                   OT Education - 01/25/20 2049    Education Details grip strengthening, [facility]    Person(s) Educated Patient    Methods Demonstration;Explanation    Comprehension Verbalized understanding;Returned demonstration               OT Long Term Goals - 01/17/20 2024      OT LONG TERM GOAL #1   Title Pt will be independent with home exercise program for strength, ROM and coordination    Baseline will need modifications to current program from home health     Time 12    Status New    Target Date 04/10/20      OT LONG TERM GOAL #2   Title Pt to improve grip by 10# to be able to hold objects without dropping    Baseline left grip 47# at eval    Time 12    Period Weeks    Status New    Target Date 04/10/20      OT LONG TERM GOAL #3   Title Pt will complete cutting and chopping food for meal prep and self feeding with modified independence    Baseline difficulty with cutting food at eval    Time 12    Period Weeks    Status New    Target Date 04/10/20      OT LONG TERM GOAL #4   Title Pt will improve FOTO score by 10 points to show a clinically relevant change in ability to perform necessary self care and IADL tasks with greater ease during ADL tasks.    Baseline eval FOTO 55    Time 12    Period Weeks    Status New    Target Date 04/10/20      OT LONG TERM GOAL #5   Title Pt will demonstrate use of keyboard with bilateral UEs with good speed and minimal errors.    Baseline difficulty with use of left hand with typing.    Time 12    Period Weeks    Status New    Target Date 04/10/20      Long Term Additional Goals   Additional Long Term Goals Yes      OT LONG TERM GOAL #6   Title Pt will demonstrate secure grip on golf club during all phases of swing.    Baseline eval difficulty with gripping patterns    Time 12    Period Weeks    Status New    Target Date 04/10/20      OT LONG TERM GOAL #7   Title Patient will demonstrate improved grip to squeeze out toothpaste with left hand with modified independence.    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  Decreased speed, dexterity and accuracy of hand skills but has been improving.  Patient is working at home on exercises  and manipulation skills on a daily basis which helps with consistency.  Continue to work towards goals in plan of care to improve functional hand use for  necessary daily tasks.    OT Occupational Profile and History Detailed Assessment- Review of Records and additional review of physical, cognitive, psychosocial history related to current functional performance    Occupational performance deficits (Please refer to evaluation for details): ADL's;Work;IADL's;Leisure;Social Participation    Body Structure / Function / Physical Skills ADL;Dexterity;Flexibility;ROM;Strength;Balance;Coordination;FMC;IADL;UE functional use;GMC    Psychosocial Skills Environmental  Adaptations;Routines and Behaviors;Habits    Rehab Potential Excellent    Clinical Decision Making Limited treatment options, no task modification necessary    Comorbidities Affecting Occupational Performance: May have comorbidities impacting occupational performance    Modification or Assistance to Complete Evaluation  No modification of tasks or assist necessary to complete eval    OT Frequency 2x / week    OT Duration 12 weeks    OT Treatment/Interventions Self-care/ADL training;Cryotherapy;Therapeutic exercise;DME and/or AE instruction;Balance training;Neuromuscular education;Manual Therapy;Moist Heat;Contrast Bath;Therapeutic activities;Patient/family education    Consulted and Agree with Plan of Care Patient           Patient will benefit from skilled therapeutic intervention in order to improve the following deficits and impairments:   Body Structure / Function / Physical Skills: ADL, Dexterity, Flexibility, ROM, Strength, Balance, Coordination, FMC, IADL, UE functional use, GMC   Psychosocial Skills: Environmental  Adaptations, Routines and Behaviors, Habits   Visit Diagnosis: Muscle weakness (generalized)  Other lack of coordination    Problem List Patient Active Problem List   Diagnosis Date Noted  . 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 Acute thalamic infarction ([place]) 12/25/2019  . S/P ablation of atrial fibrillation 12/25/2019  . Food impaction of esophagus    Renetta Suman Oneita Jolly, OTR/L, CLT  Annagrace Carr 01/25/2020, 9:00 PM  [place] MAIN [facility] SERVICES [address] [place], [place], [address] Phone: [phone]   Fax:  [phone]  Name: ROBIN PAFFORD MRN: [id number] Date of Birth: Apr 19, 1966

## 2020-01-27 ENCOUNTER — Encounter: Payer: Self-pay | Admitting: Occupational Therapy

## 2020-01-27 ENCOUNTER — Ambulatory Visit: Payer: 59 | Admitting: Occupational Therapy

## 2020-01-27 ENCOUNTER — Other Ambulatory Visit: Payer: Self-pay

## 2020-01-27 DIAGNOSIS — M6281 Muscle weakness (generalized): Secondary | ICD-10-CM | POA: Diagnosis not present

## 2020-01-27 DIAGNOSIS — R278 Other lack of coordination: Secondary | ICD-10-CM

## 2020-01-27 NOTE — Therapy (Signed)
[place] MAIN [facility] SERVICES [address] [place], [place], [address] Phone: [phone]   Fax:  [phone]  Occupational Therapy Treatment  Patient Details  Name: Carlos Nolan MRN: [id number] Date of Birth: Jan 26, 1966 No data recorded  Encounter Date: 01/27/2020   OT End of Session - 01/28/20 0914    Visit Number 4    Number of Visits 24    Date for OT Re-Evaluation 04/10/20    OT Start Time 1016    OT Stop Time 1059    OT Time Calculation (min) 43 min    Activity Tolerance Patient tolerated treatment well    Behavior During Therapy [facility] for tasks assessed/performed           Past Medical History:  Diagnosis Date  . Atrial fibrillation ([place])   . COVID-19 01/2019   Sept 2020    Past Surgical History:  Procedure Laterality Date  . APPENDECTOMY    . CARDIAC ELECTROPHYSIOLOGY STUDY AND ABLATION  04/24/2015  . ELBOW FRACTURE SURGERY    . ESOPHAGOGASTRODUODENOSCOPY N/A 01/11/2016   Procedure: ESOPHAGOGASTRODUODENOSCOPY (EGD);  Surgeon: Mauri Pole, MD;  Location: [facility];  Service: Endoscopy;  Laterality: N/A;  . FEMUR FRACTURE SURGERY    . HARDWARE REMOVAL Left 08/22/2019   Procedure: HARDWARE REMOVAL FROM LEFT ELBOW;  Surgeon: Corky Mull, MD;  Location: [facility];  Service: Orthopedics;  Laterality: Left;  . LOOP RECORDER INSERTION N/A 01/21/2020   Procedure: LOOP RECORDER INSERTION;  Surgeon: Isaias Cowman, MD;  Location: [facility];  Service: Cardiovascular;  Laterality: N/A;  . SHOULDER ARTHROSCOPY Right 06/15/2016   Procedure: Arthroscopic labral debridement, arthroscopic subacromial decompression, and mini-open bursectomy with exploration of rotator cuff, right shoulder;  Surgeon: Corky Mull, MD;  Location: [facility];  Service: Orthopedics;  Laterality: Right;    There were no vitals filed for this visit.   Subjective Assessment - 01/28/20 0914    Subjective  Pain in left side of  rib cage, left foot.  Pins and needles in left hand.    Pain Onset In the past 7 days            Pt reports improving with walking and managing the stairs, just being really careful.  Knee feels a little weak at times like it wants to buckle.  Has been working on left arm exercises with weights, playing games and picking up small objects.  Worked on mowing the grass this weekend which went well, using self propelled push mower.   Difficulty with holding remote in left hand and isolating to manage the buttons.  Left hand feels like there is a glove on his hand, some difficulty with maintaining grip on mower, felt like pins and needles.   Therapeutic Exercises: 2# wrist weight to left UE while reaching and moving shapes from 4 level tower with graduated reach.  Occasional cues for reaching patterns, grasping technique.  Neuromuscular reeducation:  Manipulation of large jacks, picking up by the stem and twisting.  Instructed on the game of perfection to pick up pieces by the small stems to place into the board.  1st round did not perform against the timer.   2nd round with timer, able to complete 18/25 in 60 sec.  3rd round with attempts to hold a handful and work each one out to fingertips to place into board, dropping 2, completed 10/25. 4th trial /25 in 60 sec    Response to tx: Patient continues  to progress and perform well in the clinic, he remains highly motivated to carryover tasks at home and is consistent with his home program.  He was able to use self propelled mower for yardwork but admits difficulty with the sustained gripping pattern and had to modify the task.  Pins and needles sensation heightened over the last week or so which is aggravating.  Continue to work towards goals in plan of care to increase functional independence in daily tasks at home, in the community and towards potential return to work tasks.                  OT Education - 01/28/20 0914     Education Details reaching with weight, fine motor coordination skills    Person(s) Educated Patient    Methods Demonstration;Explanation    Comprehension Verbalized understanding;Returned demonstration               OT Long Term Goals - 01/17/20 2024      OT LONG TERM GOAL #1   Title Pt will be independent with home exercise program for strength, ROM and coordination    Baseline will need modifications to current program from home health    Time 12    Status New    Target Date 04/10/20      OT LONG TERM GOAL #2   Title Pt to improve grip by 10# to be able to hold objects without dropping    Baseline left grip 47# at eval    Time 12    Period Weeks    Status New    Target Date 04/10/20      OT LONG TERM GOAL #3   Title Pt will complete cutting and chopping food for meal prep and self feeding with modified independence    Baseline difficulty with cutting food at eval    Time 12    Period Weeks    Status New    Target Date 04/10/20      OT LONG TERM GOAL #4   Title Pt will improve FOTO score by 10 points to show a clinically relevant change in ability to perform necessary self care and IADL tasks with greater ease during ADL tasks.    Baseline eval FOTO 55    Time 12    Period Weeks    Status New    Target Date 04/10/20      OT LONG TERM GOAL #5   Title Pt will demonstrate use of keyboard with bilateral UEs with good speed and minimal errors.    Baseline difficulty with use of left hand with typing.    Time 12    Period Weeks    Status New    Target Date 04/10/20      Long Term Additional Goals   Additional Long Term Goals Yes      OT LONG TERM GOAL #6   Title Pt will demonstrate secure grip on golf club during all phases of swing.    Baseline eval difficulty with gripping patterns    Time 12    Period Weeks    Status New    Target Date 04/10/20      OT LONG TERM GOAL #7   Title Patient will demonstrate improved grip to squeeze out toothpaste with left  hand with modified independence.    Baseline eval difficulty    Time 6    Period Weeks    Status New    Target Date 02/28/20  Plan - 01/28/20 0915    Clinical Impression Statement Patient continues to progress and perform well in the clinic, he remains highly motivated to carryover tasks at home and is consistent with his home program.  He was able to use self propelled mower for yardwork but admits difficulty with the sustained gripping pattern and had to modify the task.  Pins and needles sensation heightened over the last week or so which is aggravating.  Continue to work towards goals in plan of care to increase functional independence in daily tasks at home, in the community and towards potential return to work tasks.    OT Occupational Profile and History Detailed Assessment- Review of Records and additional review of physical, cognitive, psychosocial history related to current functional performance    Occupational performance deficits (Please refer to evaluation for details): ADL's;Work;IADL's;Leisure;Social Participation    Body Structure / Function / Physical Skills ADL;Dexterity;Flexibility;ROM;Strength;Balance;Coordination;FMC;IADL;UE functional use;GMC    Psychosocial Skills Environmental  Adaptations;Routines and Behaviors;Habits    Rehab Potential Excellent    Clinical Decision Making Limited treatment options, no task modification necessary    Comorbidities Affecting Occupational Performance: May have comorbidities impacting occupational performance    Modification or Assistance to Complete Evaluation  No modification of tasks or assist necessary to complete eval    OT Frequency 2x / week    OT Duration 12 weeks    OT Treatment/Interventions Self-care/ADL training;Cryotherapy;Therapeutic exercise;DME and/or AE instruction;Balance training;Neuromuscular education;Manual Therapy;Moist Heat;Contrast Bath;Therapeutic activities;Patient/family education     Consulted and Agree with Plan of Care Patient           Patient will benefit from skilled therapeutic intervention in order to improve the following deficits and impairments:   Body Structure / Function / Physical Skills: ADL, Dexterity, Flexibility, ROM, Strength, Balance, Coordination, FMC, IADL, UE functional use, GMC   Psychosocial Skills: Environmental  Adaptations, Routines and Behaviors, Habits   Visit Diagnosis: Muscle weakness (generalized)  Other lack of coordination    Problem List Patient Active Problem List   Diagnosis Date Noted  . Stroke (cerebrum) ([place]) 12/26/2019  . Acute thalamic infarction ([place]) 12/25/2019  . S/P ablation of atrial fibrillation 12/25/2019  . Food impaction of esophagus    Ambers Iyengar Oneita Jolly, OTR/L, CLT  Shawnda Mauney 01/29/2020, 9:19 AM  [place] MAIN [facility] SERVICES [address] [place], [place], [address] Phone: [phone]   Fax:  [phone]  Name: Carlos Nolan MRN: [id number] Date of Birth: 31-Jan-1966

## 2020-01-29 ENCOUNTER — Other Ambulatory Visit: Payer: Self-pay

## 2020-01-29 ENCOUNTER — Ambulatory Visit: Payer: 59

## 2020-01-29 ENCOUNTER — Ambulatory Visit: Payer: 59 | Admitting: Occupational Therapy

## 2020-01-29 DIAGNOSIS — R278 Other lack of coordination: Secondary | ICD-10-CM

## 2020-01-29 DIAGNOSIS — R29898 Other symptoms and signs involving the musculoskeletal system: Secondary | ICD-10-CM

## 2020-01-29 DIAGNOSIS — R2689 Other abnormalities of gait and mobility: Secondary | ICD-10-CM

## 2020-01-29 DIAGNOSIS — R2681 Unsteadiness on feet: Secondary | ICD-10-CM

## 2020-01-29 DIAGNOSIS — M6281 Muscle weakness (generalized): Secondary | ICD-10-CM | POA: Diagnosis not present

## 2020-01-29 NOTE — Therapy (Signed)
[place] MAIN [facility] SERVICES [address] [place], [place], [address] Phone: [phone]   Fax:  [phone]  Physical Therapy Treatment  Patient Details  Name: Carlos Nolan MRN: [id number] Date of Birth: 02-18-1966 Referring Provider (PT): Anabel Bene   Encounter Date: 01/29/2020   PT End of Session - 01/29/20 1045    Visit Number 4    Number of Visits 17    Date for PT Re-Evaluation 03/13/20    Authorization Type 4/10 eval 01/17/20    PT Start Time 1101    PT Stop Time 1145    PT Time Calculation (min) 44 min    Equipment Utilized During Treatment Gait belt    Activity Tolerance Patient tolerated treatment well    Behavior During Therapy [facility] for tasks assessed/performed           Past Medical History:  Diagnosis Date  . Atrial fibrillation ([place])   . COVID-19 01/2019   Sept 2020    Past Surgical History:  Procedure Laterality Date  . APPENDECTOMY    . CARDIAC ELECTROPHYSIOLOGY STUDY AND ABLATION  04/24/2015  . ELBOW FRACTURE SURGERY    . ESOPHAGOGASTRODUODENOSCOPY N/A 01/11/2016   Procedure: ESOPHAGOGASTRODUODENOSCOPY (EGD);  Surgeon: Mauri Pole, MD;  Location: [facility];  Service: Endoscopy;  Laterality: N/A;  . FEMUR FRACTURE SURGERY    . HARDWARE REMOVAL Left 08/22/2019   Procedure: HARDWARE REMOVAL FROM LEFT ELBOW;  Surgeon: Corky Mull, MD;  Location: [facility];  Service: Orthopedics;  Laterality: Left;  . LOOP RECORDER INSERTION N/A 01/21/2020   Procedure: LOOP RECORDER INSERTION;  Surgeon: Isaias Cowman, MD;  Location: [place] [facility];  Service: Cardiovascular;  Laterality: N/A;  . SHOULDER ARTHROSCOPY Right 06/15/2016   Procedure: Arthroscopic labral debridement, arthroscopic subacromial decompression, and mini-open bursectomy with exploration of rotator cuff, right shoulder;  Surgeon: Corky Mull, MD;  Location: [place];  Service: Orthopedics;  Laterality: Right;    There  were no vitals filed for this visit.   Subjective Assessment - 01/29/20 1100    Subjective He notes that L sided pain has not changed. He got a therapeutic massage that did not help.    Pertinent History Patient presents to physical therapy unaccompanied and without AD.  Patient notes L sided weakness and sensory loss secondary to CVA 3 weeks ago. PMH includes atrial fibrillation with successful cardiac ablation in 2017, COVID-19 01/2019, anxiety, s/p ORIF L elbow 07/2019 with subsequent hardware removal 08/2019, appendectomy, esophagogastroduodenoscopy, self-reported history of migraines. Pt presented to [facility] ED 12/24/19 with c/o headache and dizziness. Presented ED again on 12/25/19 with same symptoms with additional numbness of L hemibody. MRI revealed multiple lacunar infarcts near R thalamus and R posterior limb and R external capsule.    Limitations Walking;Standing    How long can you sit comfortably? n/a    How long can you stand comfortably? "Not very long"    How long can you walk comfortably? One block    Patient Stated Goals To play golf again    Currently in Pain? Yes    Pain Score 5     Pain Location Rib cage    Pain Orientation Left    Pain Descriptors / Indicators Aching    Pain Type Chronic pain    Pain Onset In the past 7 days                Treatment:  NuStep 4 minutes, level 5 for  BLE and BUE coordination. RPM >50 for cardiovascular training.    Sit to Stand w/o UE support with 6" step under RLE to promote weight shift onto LLE. 12x 2 sets           - Matrix resisted walking 12.5 lb, 2x each direction,  forward, backwards, side stepping L, side  stepping R. CGA for safety          - Eccentric leg press 40# to promote eccentric control. 10x each leg.        Standing At Bar:  Eccentric step downs off 6" step to promote motor control of knees. 10x on R leg. 5x on L leg, as patient demonstrates knee buckling x2 without provocation of pain.    On Airex pad: DF/PF  rocking 10x each direction (20x total), marches   Ball kicks for coordination, sequencing and timing of muscle activation, single leg stabilization against pertubations.  20x each leg. CGA for safety. Patient uses no UE support to further challenge balance.    Supine with 5# Ankle Weights: - SLR 10x each leg - Bridging 10x. SPT cues for posterior pelvic tilt throughout task to decrease strain on back.   SPT monitored vitals throughout session to ensure safe therapeutic ranges     Pt educated throughout session about proper posture and technique with exercises. Improved exercise technique, movement at target joints, use of target muscles after min to mod verbal, visual, tactile cues.                     PT Education - 01/29/20 1045    Education Details body mechanics, exercise technique    Person(s) Educated Patient    Methods Explanation;Demonstration;Tactile cues;Verbal cues    Comprehension Verbalized understanding;Returned demonstration;Verbal cues required;Tactile cues required            PT Short Term Goals - 01/17/20 1054      PT SHORT TERM GOAL #1   Title Patient will be independent in home exercise program to improve strength/mobility for better functional independence with ADLs.    Baseline HEP administered and performed at evaluation    Time 4    Period Weeks    Target Date 02/14/20      PT SHORT TERM GOAL #2   Title Patient will demonstrate 4/5 strength with all left lower extremity movements in order to maximize functional independence.    Baseline 9/10: see note    Time 4    Period Weeks    Target Date 02/14/20             PT Long Term Goals - 01/17/20 1058      PT LONG TERM GOAL #1   Title Patient will increase FOTO score to equal to or greater than 70% to demonstrate statistically significant improvement in mobility and quality of life.    Baseline 9/10 51%    Time 8    Period Weeks    Status New    Target Date 03/13/20       PT LONG TERM GOAL #2   Title Patient (< 72 years old) will complete five times sit to stand test in < 15 seconds with equal weight shift indicating an increased LE strength and improved balance.    Baseline 9/10 21 seconds    Time 8    Period Weeks    Status New    Target Date 03/13/20      PT LONG TERM GOAL #3   Title  Patient will tolerate 5 seconds of single leg stance without loss of balance to improve ability to get in and out of shower safely.    Baseline 9/10 Unable to perform    Time 8    Period Weeks    Status New    Target Date 03/13/20      PT LONG TERM GOAL #4   Title Patient will increase 10 meter walk test to >1.85m/s with midline awareness as to improve gait speed for better community ambulation and to reduce fall risk.    Baseline 9/10 0.36m/s with R lean 1-2 inches    Time 8    Period Weeks    Status New    Target Date 03/13/20      PT LONG TERM GOAL #5   Title Patient will demonstrate an improved Berg Balance Score of >45 as to demonstrate improved stability with ADLs such as sitting/standing and transfer balance and reduced fall risk.    Baseline 9/10: 39/56    Time 8    Period Weeks    Status New    Target Date 03/13/20                 Plan - 01/29/20 1314    Clinical Impression Statement Patient demonstrates improved BLE strength and dynamic standing balance. Decreased lean to R noted throughout session, except during bridging activity where patient exhibits higher pelvic lift on R. Patient demonstrates increased difficulty with eccentric control of LLE. Patient will benefit from skilled PT to address strength, balance, and coordination impairments in order to maximize functional independence at home.    Personal Factors and Comorbidities Past/Current Experience;Comorbidity 2;Time since onset of injury/illness/exacerbation    Comorbidities Atrial fibrillation, anxiety    Examination-Activity Limitations Squat;Bend;Carry;Reach Overhead;Locomotion  Level;Lift;Stairs;Stand;Transfers    Examination-Participation Restrictions Occupation;Other;Community Activity;Cleaning;Laundry;Meal Prep;Yard Work;Shop   golfing, sporting activities   Stability/Clinical Decision Making Evolving/Moderate complexity    Rehab Potential Good    PT Frequency 2x / week    PT Duration 8 weeks    PT Treatment/Interventions Patient/family education;Therapeutic exercise;Functional mobility training;Gait training;Therapeutic activities;Balance training;Neuromuscular re-education;Manual techniques;ADLs/Self Care Home Management;Aquatic Therapy;Canalith Repostioning;Cryotherapy;Moist Heat;DME Instruction;Stair training;Cognitive remediation;Passive range of motion;Energy conservation;Taping;Vestibular;Visual/perceptual remediation/compensation    PT Next Visit Plan balance/gait training, LE strengthening    PT Home Exercise Plan see above    Consulted and Agree with Plan of Care Patient           Patient will benefit from skilled therapeutic intervention in order to improve the following deficits and impairments:  Abnormal gait, Decreased balance, Difficulty walking, Improper body mechanics, Impaired sensation, Decreased strength, Postural dysfunction, Decreased coordination, Decreased mobility, Cardiopulmonary status limiting activity, Decreased activity tolerance, Decreased endurance, Impaired flexibility, Impaired UE functional use  Visit Diagnosis: Unsteadiness on feet  Weakness of left lower extremity  Other abnormalities of gait and mobility     Problem List Patient Active Problem List   Diagnosis Date Noted  . 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 Acute thalamic infarction ([place]) 12/25/2019  . S/P ablation of atrial fibrillation 12/25/2019  . Food impaction of esophagus    Tonny Bollman, SPT This entire session was performed under direct supervision and direction of a licensed therapist/therapist assistant . I have personally read, edited and  approve of the note as written.  Janna Arch, PT, DPT   01/30/2020, 5:37 PM  [place] MAIN [facility] SERVICES [address] [place], [place], [address] Phone: [phone]   Fax:  [phone]  Name:  Carlos Nolan MRN: [id number] Date of Birth: 1965-07-13

## 2020-01-30 NOTE — Therapy (Signed)
[place] MAIN [facility] SERVICES [address] [place], [place], [address] Phone: [phone]   Fax:  [phone]  Occupational Therapy Treatment  Patient Details  Name: Carlos Nolan MRN: [id number] Date of Birth: 1965/07/07 No data recorded  Encounter Date: 01/29/2020   OT End of Session - 01/30/20 1626    Visit Number 5    Number of Visits 24    Date for OT Re-Evaluation 04/10/20    OT Start Time 1016    OT Stop Time 1100    OT Time Calculation (min) 44 min    Activity Tolerance Patient tolerated treatment well    Behavior During Therapy [facility] for tasks assessed/performed           Past Medical History:  Diagnosis Date  . Atrial fibrillation ([place])   . COVID-19 01/2019   Sept 2020    Past Surgical History:  Procedure Laterality Date  . APPENDECTOMY    . CARDIAC ELECTROPHYSIOLOGY STUDY AND ABLATION  04/24/2015  . ELBOW FRACTURE SURGERY    . ESOPHAGOGASTRODUODENOSCOPY N/A 01/11/2016   Procedure: ESOPHAGOGASTRODUODENOSCOPY (EGD);  Surgeon: Mauri Pole, MD;  Location: [facility];  Service: Endoscopy;  Laterality: N/A;  . FEMUR FRACTURE SURGERY    . HARDWARE REMOVAL Left 08/22/2019   Procedure: HARDWARE REMOVAL FROM LEFT ELBOW;  Surgeon: Corky Mull, MD;  Location: [facility];  Service: Orthopedics;  Laterality: Left;  . LOOP RECORDER INSERTION N/A 01/21/2020   Procedure: LOOP RECORDER INSERTION;  Surgeon: Isaias Cowman, MD;  Location: [place] CV [facility];  Service: Cardiovascular;  Laterality: N/A;  . SHOULDER ARTHROSCOPY Right 06/15/2016   Procedure: Arthroscopic labral debridement, arthroscopic subacromial decompression, and mini-open bursectomy with exploration of rotator cuff, right shoulder;  Surgeon: Corky Mull, MD;  Location: [place];  Service: Orthopedics;  Laterality: Right;    There were no vitals filed for this visit.   Subjective Assessment - 01/30/20 1625    Patient Stated Goals Pt reports he  would like to be able to golf again, be as independent as possible.    Currently in Pain? Yes    Pain Score 5     Pain Location Rib cage    Pain Orientation Left    Pain Descriptors / Indicators Aching    Pain Type Chronic pain    Pain Onset In the past 7 days    Pain Frequency Constant           Pain shoulder 5, foot and ribcage, pain about the same.  Went for massage yesterday but it was a bit rough.  Able to feel more on the bottom of the feet more now than in past weeks.  Purchased game of perfection and connect 4 to use at home for fine motor coordination skills.    Neuromuscular Reeducation: Patient seen for use of Connect 4 game with checkers (pt purchased game and asking how he can use it for coordination skills at home), picking up and moving to palm, using hand for storage and moving back to fingertips.  Able to hold 6 at a time with minimally dropping of items but if adding more than 6 then frequency of dropping items increases.  Added 2# weight to left wrist during task to increase strength on left.   Patient with some Tension and trigger point on left upper trap area, therapist using manual skills to work on relieving pressure points along with stretching and ROM.   Typing skills assessed for potential  return to work skills, Patient able to complete 5 mins typing test with 27 WPM, 4 errors.  Patient is unsure of baseline level of typing.  Instructed on website to use at home to further work on typing skills and a variety of drills which can be done from home as a part of his home program.    Response to tx:   Patient continuing to progress well, still has some pain in ribcage, left foot about 5/10.  Patient continues to look for ways he can incorporate fine motor coordination skills at home as a part of his home program.  Instructed on ways to utilize perfection game and connect 4 for home.  Also instructed on ways to work on typing skills, patient currently performing 27 WPM.   Continues to require cues at times for proper form and technique with exercises and ways to work towards improving skills on a daily basis to impact self care and IADL tasks.                       OT Education - 01/30/20 1625    Education Details [place], speed, dexterity    Person(s) Educated Patient    Methods Demonstration;Explanation    Comprehension Verbalized understanding;Returned demonstration               OT Long Term Goals - 01/17/20 2024      OT LONG TERM GOAL #1   Title Pt will be independent with home exercise program for strength, ROM and coordination    Baseline will need modifications to current program from home health    Time 12    Status New    Target Date 04/10/20      OT LONG TERM GOAL #2   Title Pt to improve grip by 10# to be able to hold objects without dropping    Baseline left grip 47# at eval    Time 12    Period Weeks    Status New    Target Date 04/10/20      OT LONG TERM GOAL #3   Title Pt will complete cutting and chopping food for meal prep and self feeding with modified independence    Baseline difficulty with cutting food at eval    Time 12    Period Weeks    Status New    Target Date 04/10/20      OT LONG TERM GOAL #4   Title Pt will improve FOTO score by 10 points to show a clinically relevant change in ability to perform necessary self care and IADL tasks with greater ease during ADL tasks.    Baseline eval FOTO 55    Time 12    Period Weeks    Status New    Target Date 04/10/20      OT LONG TERM GOAL #5   Title Pt will demonstrate use of keyboard with bilateral UEs with good speed and minimal errors.    Baseline difficulty with use of left hand with typing.    Time 12    Period Weeks    Status New    Target Date 04/10/20      Long Term Additional Goals   Additional Long Term Goals Yes      OT LONG TERM GOAL #6   Title Pt will demonstrate secure grip on golf club during all phases of swing.    Baseline  eval difficulty with gripping patterns    Time 12  Period Weeks    Status New    Target Date 04/10/20      OT LONG TERM GOAL #7   Title Patient will demonstrate improved grip to squeeze out toothpaste with left hand with modified independence.    Baseline eval difficulty    Time 6    Period Weeks    Status New    Target Date 02/28/20                 Plan - 01/30/20 1626    Clinical Impression Statement Patient continuing to progress well, still has some pain in ribcage, left foot about 5/10.  Patient continues to look for ways he can incorporate fine motor coordination skills at home as a part of his home program.  Instructed on ways to utilize perfection game and connect 4 for home.  Also instructed on ways to work on typing skills, patient currently performing 27 WPM.  Continues to require cues at times for proper form and technique with exercises and ways to work towards improving skills on a daily basis to impact self care and IADL tasks.    OT Occupational Profile and History Detailed Assessment- Review of Records and additional review of physical, cognitive, psychosocial history related to current functional performance    Occupational performance deficits (Please refer to evaluation for details): ADL's;Work;IADL's;Leisure;Social Participation    Body Structure / Function / Physical Skills ADL;Dexterity;Flexibility;ROM;Strength;Balance;Coordination;FMC;IADL;UE functional use;GMC    Psychosocial Skills Environmental  Adaptations;Routines and Behaviors;Habits    Rehab Potential Excellent    Clinical Decision Making Limited treatment options, no task modification necessary    Comorbidities Affecting Occupational Performance: May have comorbidities impacting occupational performance    Modification or Assistance to Complete Evaluation  No modification of tasks or assist necessary to complete eval    OT Frequency 2x / week    OT Duration 12 weeks    OT Treatment/Interventions  Self-care/ADL training;Cryotherapy;Therapeutic exercise;DME and/or AE instruction;Balance training;Neuromuscular education;Manual Therapy;Moist Heat;Contrast Bath;Therapeutic activities;Patient/family education    Consulted and Agree with Plan of Care Patient           Patient will benefit from skilled therapeutic intervention in order to improve the following deficits and impairments:   Body Structure / Function / Physical Skills: ADL, Dexterity, Flexibility, ROM, Strength, Balance, Coordination, FMC, IADL, UE functional use, GMC   Psychosocial Skills: Environmental  Adaptations, Routines and Behaviors, Habits   Visit Diagnosis: Muscle weakness (generalized)  Other lack of coordination  Unsteadiness on feet    Problem List Patient Active Problem List   Diagnosis Date Noted  . 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 Acute thalamic infarction ([place]) 12/25/2019  . S/P ablation of atrial fibrillation 12/25/2019  . Food impaction of esophagus    Bennie Chirico Oneita Jolly, OTR/L, CLT  Ziasia Lenoir 01/30/2020, 4:38 PM  [place] MAIN [facility] SERVICES [address] [place], [place], [address] Phone: [phone]   Fax:  [phone]  Name: Carlos Nolan MRN: [id number] Date of Birth: Oct 01, 1965

## 2020-01-31 ENCOUNTER — Ambulatory Visit: Payer: 59

## 2020-01-31 ENCOUNTER — Other Ambulatory Visit: Payer: Self-pay

## 2020-01-31 DIAGNOSIS — R2689 Other abnormalities of gait and mobility: Secondary | ICD-10-CM

## 2020-01-31 DIAGNOSIS — R2681 Unsteadiness on feet: Secondary | ICD-10-CM

## 2020-01-31 DIAGNOSIS — M6281 Muscle weakness (generalized): Secondary | ICD-10-CM

## 2020-01-31 DIAGNOSIS — R29898 Other symptoms and signs involving the musculoskeletal system: Secondary | ICD-10-CM

## 2020-01-31 NOTE — Therapy (Signed)
[place] MAIN [facility] SERVICES [address] [place], [place], [address] Phone: [phone]   Fax:  [phone]  Physical Therapy Treatment  Patient Details  Name: Carlos Nolan MRN: [id number] Date of Birth: 1966/03/03 Referring Provider (PT): Anabel Bene   Encounter Date: 01/31/2020   PT End of Session - 01/31/20 1103    Visit Number 5    Number of Visits 17    Date for PT Re-Evaluation 03/13/20    Authorization Type 5/10 eval 01/17/20    PT Start Time 1000    PT Stop Time 1049    PT Time Calculation (min) 49 min    Equipment Utilized During Treatment Gait belt    Activity Tolerance Patient tolerated treatment well    Behavior During Therapy [facility] for tasks assessed/performed           Past Medical History:  Diagnosis Date  . Atrial fibrillation ([place])   . COVID-19 01/2019   Sept 2020    Past Surgical History:  Procedure Laterality Date  . APPENDECTOMY    . CARDIAC ELECTROPHYSIOLOGY STUDY AND ABLATION  04/24/2015  . ELBOW FRACTURE SURGERY    . ESOPHAGOGASTRODUODENOSCOPY N/A 01/11/2016   Procedure: ESOPHAGOGASTRODUODENOSCOPY (EGD);  Surgeon: Mauri Pole, MD;  Location: [facility];  Service: Endoscopy;  Laterality: N/A;  . FEMUR FRACTURE SURGERY    . HARDWARE REMOVAL Left 08/22/2019   Procedure: HARDWARE REMOVAL FROM LEFT ELBOW;  Surgeon: Corky Mull, MD;  Location: [facility];  Service: Orthopedics;  Laterality: Left;  . LOOP RECORDER INSERTION N/A 01/21/2020   Procedure: LOOP RECORDER INSERTION;  Surgeon: Isaias Cowman, MD;  Location: [facility];  Service: Cardiovascular;  Laterality: N/A;  . SHOULDER ARTHROSCOPY Right 06/15/2016   Procedure: Arthroscopic labral debridement, arthroscopic subacromial decompression, and mini-open bursectomy with exploration of rotator cuff, right shoulder;  Surgeon: Corky Mull, MD;  Location: [place];  Service: Orthopedics;  Laterality: Right;    There  were no vitals filed for this visit.   Subjective Assessment - 01/31/20 1057    Subjective Patient reports L side pain has not changed at all, nothing helps. Has been compliant with HEP with no falls or LOB since last session.    Pertinent History Patient presents to physical therapy unaccompanied and without AD.  Patient notes L sided weakness and sensory loss secondary to CVA 3 weeks ago. PMH includes atrial fibrillation with successful cardiac ablation in 2017, COVID-19 01/2019, anxiety, s/p ORIF L elbow 07/2019 with subsequent hardware removal 08/2019, appendectomy, esophagogastroduodenoscopy, self-reported history of migraines. Pt presented to [facility] ED 12/24/19 with c/o headache and dizziness. Presented ED again on 12/25/19 with same symptoms with additional numbness of L hemibody. MRI revealed multiple lacunar infarcts near R thalamus and R posterior limb and R external capsule.    Limitations Walking;Standing    How long can you sit comfortably? n/a    How long can you stand comfortably? "Not very long"    How long can you walk comfortably? One block    Patient Stated Goals To play golf again    Currently in Pain? Yes    Pain Score 5     Pain Location Rib cage    Pain Orientation Left    Pain Descriptors / Indicators Aching    Pain Type Chronic pain    Pain Onset In the past 7 days    Pain Frequency Intermittent  Nustep Lvl 5 4 minutes RPM> 60 for cardiovascular challenge  Neuro Re-ed: next to support bar for stability and safety with CGA  Eccentric heel taps on 6" step onto airex pad for reduction of distance (4") 10x each LE, use of band for promotion of stabilization in proper alignment  Lateral eccentric heel taps on 6" step onto airex pad for reduction of distance (4") 10x each LE, use of band for promotion of stabilization in proper alignment-terminated due to pain in meniscal region : upon further assessment with focus on modified knee flexion positioning with  flexion/extension of opp Le to obtain strengthening/stability:   Airex pad: forward and backward pendulum lunges with opp LE on airex pad: 10x each LE    bosu ball: -flat side up: static stand 30 seconds with finger tip to no UE support 2 sets, cues for core activation for reduction of LOB/tremors -round side up: alternating karate kid step ups (crane position) with finger tip support 10x each LE -round side up: static stand on one LE with alt LE performing consecutive flexion, abduction, extension with flexion of knee 8x one LE, switch to opp LE for additional 8x   Prone: L rib inferior glides : radiating pain noted with significant muscle guarding rib 6-7 CPA painful thoracic and lumbar region : no centralization of pain.  -education on icing and wrapping with ace bandage ; patient verbalized understanding  Pt educated throughout session about proper posture and technique with exercises. Improved exercise technique, movement at target joints, use of target muscles after min to mod verbal, visual, tactile cues.                     PT Education - 01/31/20 1100    Education Details exercise technique, body mechanics, COM,    Person(s) Educated Patient    Methods Explanation;Demonstration;Tactile cues;Verbal cues    Comprehension Verbalized understanding;Returned demonstration;Verbal cues required;Tactile cues required            PT Short Term Goals - 01/17/20 1054      PT SHORT TERM GOAL #1   Title Patient will be independent in home exercise program to improve strength/mobility for better functional independence with ADLs.    Baseline HEP administered and performed at evaluation    Time 4    Period Weeks    Target Date 02/14/20      PT SHORT TERM GOAL #2   Title Patient will demonstrate 4/5 strength with all left lower extremity movements in order to maximize functional independence.    Baseline 9/10: see note    Time 4    Period Weeks    Target Date  02/14/20             PT Long Term Goals - 01/17/20 1058      PT LONG TERM GOAL #1   Title Patient will increase FOTO score to equal to or greater than 70% to demonstrate statistically significant improvement in mobility and quality of life.    Baseline 9/10 51%    Time 8    Period Weeks    Status New    Target Date 03/13/20      PT LONG TERM GOAL #2   Title Patient (< 89 years old) will complete five times sit to stand test in < 15 seconds with equal weight shift indicating an increased LE strength and improved balance.    Baseline 9/10 21 seconds    Time 8    Period Weeks  Status New    Target Date 03/13/20      PT LONG TERM GOAL #3   Title Patient will tolerate 5 seconds of single leg stance without loss of balance to improve ability to get in and out of shower safely.    Baseline 9/10 Unable to perform    Time 8    Period Weeks    Status New    Target Date 03/13/20      PT LONG TERM GOAL #4   Title Patient will increase 10 meter walk test to >1.81m/s with midline awareness as to improve gait speed for better community ambulation and to reduce fall risk.    Baseline 9/10 0.40m/s with R lean 1-2 inches    Time 8    Period Weeks    Status New    Target Date 03/13/20      PT LONG TERM GOAL #5   Title Patient will demonstrate an improved Berg Balance Score of >45 as to demonstrate improved stability with ADLs such as sitting/standing and transfer balance and reduced fall risk.    Baseline 9/10: 39/56    Time 8    Period Weeks    Status New    Target Date 03/13/20                 Plan - 01/31/20 1104    Clinical Impression Statement Due to patient's history of LLE injury with metal rod present in leg and limited ability to flex knee without pain past 45 degrees he indicates need for focused stabilization and strengthening of musculature surrounding knee joint in pain free range (<45 degrees flexion) with dynamic mobility. Continued focus on COM on unstable  surfaces with progressions due to improved intrinsic cues. Patient will benefit from skilled PT to address strength, balance, and coordination impairments in order to maximize functional independence at home    Personal Factors and Comorbidities Past/Current Experience;Comorbidity 2;Time since onset of injury/illness/exacerbation    Comorbidities Atrial fibrillation, anxiety    Examination-Activity Limitations Squat;Bend;Carry;Reach Overhead;Locomotion Level;Lift;Stairs;Stand;Transfers    Examination-Participation Restrictions Occupation;Other;Community Activity;Cleaning;Laundry;Meal Prep;Yard Work;Shop   golfing, sporting activities   Stability/Clinical Decision Making Evolving/Moderate complexity    Rehab Potential Good    PT Frequency 2x / week    PT Duration 8 weeks    PT Treatment/Interventions Patient/family education;Therapeutic exercise;Functional mobility training;Gait training;Therapeutic activities;Balance training;Neuromuscular re-education;Manual techniques;ADLs/Self Care Home Management;Aquatic Therapy;Canalith Repostioning;Cryotherapy;Moist Heat;DME Instruction;Stair training;Cognitive remediation;Passive range of motion;Energy conservation;Taping;Vestibular;Visual/perceptual remediation/compensation    PT Next Visit Plan balance/gait training, LE strengthening    PT Home Exercise Plan see above    Consulted and Agree with Plan of Care Patient           Patient will benefit from skilled therapeutic intervention in order to improve the following deficits and impairments:  Abnormal gait, Decreased balance, Difficulty walking, Improper body mechanics, Impaired sensation, Decreased strength, Postural dysfunction, Decreased coordination, Decreased mobility, Cardiopulmonary status limiting activity, Decreased activity tolerance, Decreased endurance, Impaired flexibility, Impaired UE functional use  Visit Diagnosis: Unsteadiness on feet  Weakness of left lower extremity  Other  abnormalities of gait and mobility  Muscle weakness (generalized)     Problem List Patient Active Problem List   Diagnosis Date Noted  . 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 Acute thalamic infarction ([place]) 12/25/2019  . S/P ablation of atrial fibrillation 12/25/2019  . Food impaction of esophagus    Janna Arch, PT, DPT   01/31/2020, 11:05 AM  [place] [facility]IN REHAB  SERVICES [place], [place], [address] Phone: [phone]   Fax:  [phone]  Name: JAMONTAE THWAITES MRN: [id number] Date of Birth: May 07, 1966

## 2020-02-04 ENCOUNTER — Encounter: Payer: Self-pay | Admitting: Occupational Therapy

## 2020-02-04 ENCOUNTER — Ambulatory Visit: Payer: 59 | Admitting: Occupational Therapy

## 2020-02-04 ENCOUNTER — Other Ambulatory Visit: Payer: Self-pay

## 2020-02-04 ENCOUNTER — Ambulatory Visit: Payer: 59

## 2020-02-04 DIAGNOSIS — R2689 Other abnormalities of gait and mobility: Secondary | ICD-10-CM

## 2020-02-04 DIAGNOSIS — R278 Other lack of coordination: Secondary | ICD-10-CM

## 2020-02-04 DIAGNOSIS — M6281 Muscle weakness (generalized): Secondary | ICD-10-CM | POA: Diagnosis not present

## 2020-02-04 DIAGNOSIS — R29898 Other symptoms and signs involving the musculoskeletal system: Secondary | ICD-10-CM

## 2020-02-04 DIAGNOSIS — R2681 Unsteadiness on feet: Secondary | ICD-10-CM

## 2020-02-04 NOTE — Therapy (Signed)
[place] MAIN [facility] SERVICES [address] [place], [place], [address] Phone: [phone]   Fax:  [phone]  Occupational Therapy Treatment  Patient Details  Name: Carlos Nolan MRN: [id number] Date of Birth: 22-Nov-1965 No data recorded  Encounter Date: 02/04/2020   OT End of Session - 02/04/20 1154    Visit Number 6    Number of Visits 24    Date for OT Re-Evaluation 04/10/20    OT Start Time 1102    OT Stop Time 1145    OT Time Calculation (min) 43 min    Activity Tolerance Patient tolerated treatment well    Behavior During Therapy [facility] for tasks assessed/performed           Past Medical History:  Diagnosis Date  . Atrial fibrillation ([place])   . COVID-19 01/2019   Sept 2020    Past Surgical History:  Procedure Laterality Date  . APPENDECTOMY    . CARDIAC ELECTROPHYSIOLOGY STUDY AND ABLATION  04/24/2015  . ELBOW FRACTURE SURGERY    . ESOPHAGOGASTRODUODENOSCOPY N/A 01/11/2016   Procedure: ESOPHAGOGASTRODUODENOSCOPY (EGD);  Surgeon: Mauri Pole, MD;  Location: [facility];  Service: Endoscopy;  Laterality: N/A;  . FEMUR FRACTURE SURGERY    . HARDWARE REMOVAL Left 08/22/2019   Procedure: HARDWARE REMOVAL FROM LEFT ELBOW;  Surgeon: Corky Mull, MD;  Location: [facility];  Service: Orthopedics;  Laterality: Left;  . LOOP RECORDER INSERTION N/A 01/21/2020   Procedure: LOOP RECORDER INSERTION;  Surgeon: Isaias Cowman, MD;  Location: [place] [facility];  Service: Cardiovascular;  Laterality: N/A;  . SHOULDER ARTHROSCOPY Right 06/15/2016   Procedure: Arthroscopic labral debridement, arthroscopic subacromial decompression, and mini-open bursectomy with exploration of rotator cuff, right shoulder;  Surgeon: Corky Mull, MD;  Location: [place];  Service: Orthopedics;  Laterality: Right;    There were no vitals filed for this visit.   Subjective Assessment - 02/04/20 1152    Subjective  Pt. ahas an appointment  with Dr. Melrose Nakayama next week, and had questions about returning to work.    Patient Stated Goals Pt reports he would like to be able to golf again, be as independent as possible.    Currently in Pain? No/denies          OT TREATMENT    Neuro muscular re-education:  Pt. worked on Left hand [facility] skills grasping 1" grooved pegs, turning the pegs into position in his hand, and reaching up to place the peg into the grooved pegboard. Pt. was able to perform the task with a 2" weight in place requiring increased time, and rest breaks. Pt. worked on grasping 1" resistive cubes alternating thumb opposition to the tip of the 2nd through 5th digits while the board is placed at an elevated vertical angle. Pt. worked on pressing the cubes back into place while alternating isolated 2nd through 5th digit extension.  Therapeutic Exercise:  Pt. education was provided about additional theraputty exercises for gross digit extension, and thumb abduction. Pt was able to return demonstration. Pt. worked on pinch strengthening in the left hand for lateral, and 3pt. pinch using yellow, red, green, blue, and black resistive clips. Pt. worked on placing the clips at various horizontal angles. Tactile and verbal cues were required for eliciting the desired movement.  Pt. Continues to use his left hand more at home, however continues to present with limited left hand motor control, strength,  FMC, and dexterity. Pt.reports that the left shoulder pain is  improved today. Pt. Required rest breaks for tasks challenging [facility] with sustained LUE elevation during reaching. Pt. Continues to be motivated, and is consistently using his is left hand to complete his HEP, and engage his LUE in ADL, and IADL tasks at home. Pt. Continues to benefit from skilled OT services to continues to work on improving left hand function in order to work towards improving, and maximizing independence with ADLs, and  IADLs.                       OT Education - 02/04/20 1154    Education Details [place], speed, dexterity    Person(s) Educated Patient    Methods Demonstration;Explanation    Comprehension Verbalized understanding;Returned demonstration               OT Long Term Goals - 01/17/20 2024      OT LONG TERM GOAL #1   Title Pt will be independent with home exercise program for strength, ROM and coordination    Baseline will need modifications to current program from home health    Time 12    Status New    Target Date 04/10/20      OT LONG TERM GOAL #2   Title Pt to improve grip by 10# to be able to hold objects without dropping    Baseline left grip 47# at eval    Time 12    Period Weeks    Status New    Target Date 04/10/20      OT LONG TERM GOAL #3   Title Pt will complete cutting and chopping food for meal prep and self feeding with modified independence    Baseline difficulty with cutting food at eval    Time 12    Period Weeks    Status New    Target Date 04/10/20      OT LONG TERM GOAL #4   Title Pt will improve FOTO score by 10 points to show a clinically relevant change in ability to perform necessary self care and IADL tasks with greater ease during ADL tasks.    Baseline eval FOTO 55    Time 12    Period Weeks    Status New    Target Date 04/10/20      OT LONG TERM GOAL #5   Title Pt will demonstrate use of keyboard with bilateral UEs with good speed and minimal errors.    Baseline difficulty with use of left hand with typing.    Time 12    Period Weeks    Status New    Target Date 04/10/20      Long Term Additional Goals   Additional Long Term Goals Yes      OT LONG TERM GOAL #6   Title Pt will demonstrate secure grip on golf club during all phases of swing.    Baseline eval difficulty with gripping patterns    Time 12    Period Weeks    Status New    Target Date 04/10/20      OT LONG TERM GOAL #7   Title Patient will  demonstrate improved grip to squeeze out toothpaste with left hand with modified independence.    Baseline eval difficulty    Time 6    Period Weeks    Status New    Target Date 02/28/20                 Plan - 02/04/20  1156    Clinical Impression Statement Pt. Continues to use his left hand more at home, however continues to present with limited left hand motor control, strength,  FMC, and dexterity. Pt.reports that the left shoulder pain is improved today. Pt. Required rest breaks for tasks challenging [facility] with sustained LUE elevation during reaching. Pt. Continues to be motivated, and is consistently using his is left hand to complete his HEP, and engage his LUE in ADL, and IADL tasks at home. Pt. Continues to benefit from skilled OT services to continues to work on improving left hand function in order to work towards improving, and maximizing independence with ADLs, and IADLs.   OT Occupational Profile and History Detailed Assessment- Review of Records and additional review of physical, cognitive, psychosocial history related to current functional performance    Occupational performance deficits (Please refer to evaluation for details): ADL's;Work;IADL's;Leisure;Social Participation    Body Structure / Function / Physical Skills ADL;Dexterity;Flexibility;ROM;Strength;Balance;Coordination;FMC;IADL;UE functional use;GMC    Psychosocial Skills Environmental  Adaptations;Routines and Behaviors;Habits    Rehab Potential Excellent    Clinical Decision Making Limited treatment options, no task modification necessary    Comorbidities Affecting Occupational Performance: May have comorbidities impacting occupational performance    Modification or Assistance to Complete Evaluation  No modification of tasks or assist necessary to complete eval    OT Frequency 2x / week    OT Duration 12 weeks    OT Treatment/Interventions Self-care/ADL training;Cryotherapy;Therapeutic exercise;DME and/or AE  instruction;Balance training;Neuromuscular education;Manual Therapy;Moist Heat;Contrast Bath;Therapeutic activities;Patient/family education    Consulted and Agree with Plan of Care Patient           Patient will benefit from skilled therapeutic intervention in order to improve the following deficits and impairments:   Body Structure / Function / Physical Skills: ADL, Dexterity, Flexibility, ROM, Strength, Balance, Coordination, FMC, IADL, UE functional use, GMC   Psychosocial Skills: Environmental  Adaptations, Routines and Behaviors, Habits   Visit Diagnosis: Muscle weakness (generalized)  Other lack of coordination    Problem List Patient Active Problem List   Diagnosis Date Noted  . 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 Acute thalamic infarction ([place]) 12/25/2019  . S/P ablation of atrial fibrillation 12/25/2019  . Food impaction of esophagus     Harrel Carina, MS, OTR/L 02/04/2020, 11:58 AM  [place] MAIN [facility] SERVICES [address] [place], [place], [address] Phone: [phone]   Fax:  [phone]  Name: Carlos Nolan MRN: [id number] Date of Birth: 1965/10/08

## 2020-02-04 NOTE — Therapy (Signed)
[place] MAIN [facility] SERVICES [address] [place], [place], [address] Phone: [phone]   Fax:  [phone]  Physical Therapy Treatment  Patient Details  Name: Carlos Nolan MRN: [id number] Date of Birth: Nov 10, 1965 Referring Provider (PT): Anabel Bene   Encounter Date: 02/04/2020   PT End of Session - 02/04/20 1058    Visit Number 6    Number of Visits 17    Date for PT Re-Evaluation 03/13/20    Authorization Type 6/10 eval 01/17/20    PT Start Time 0845    PT Stop Time 0929    PT Time Calculation (min) 44 min    Equipment Utilized During Treatment Gait belt    Activity Tolerance Patient tolerated treatment well    Behavior During Therapy [facility] for tasks assessed/performed           Past Medical History:  Diagnosis Date   Atrial fibrillation [facility])    COVID-19 01/2019   Sept 2020    Past Surgical History:  Procedure Laterality Date   APPENDECTOMY     CARDIAC ELECTROPHYSIOLOGY STUDY AND ABLATION  04/24/2015   ELBOW FRACTURE SURGERY     ESOPHAGOGASTRODUODENOSCOPY N/A 01/11/2016   Procedure: ESOPHAGOGASTRODUODENOSCOPY (EGD);  Surgeon: Mauri Pole, MD;  Location: [facility];  Service: Endoscopy;  Laterality: N/A;   FEMUR FRACTURE SURGERY     HARDWARE REMOVAL Left 08/22/2019   Procedure: HARDWARE REMOVAL FROM LEFT ELBOW;  Surgeon: Corky Mull, MD;  Location: [facility] OR[facility];  Service: Orthopedics;  Laterality: Left;   LOOP RECORDER INSERTION N/A 01/21/2020   Procedure: LOOP RECORDER INSERTION;  Surgeon: Isaias Cowman, MD;  Location: [place] CV LAB;  Service: Cardiovascular;  Laterality: N/A;   SHOULDER ARTHROSCOPY Right 06/15/2016   Procedure: Arthroscopic labral debridement, arthroscopic subacromial decompression, and mini-open bursectomy with exploration of rotator cuff, right shoulder;  Surgeon: Corky Mull, MD;  Location: [place];  Service: Orthopedics;  Laterality: Right;    There  were no vitals filed for this visit.   Subjective Assessment - 02/04/20 0845    Subjective Patient denies falls or LOB since last session. Has been compliant with HEP every day. Back pain still present but has improved    Pertinent History Patient presents to physical therapy unaccompanied and without AD.  Patient notes L sided weakness and sensory loss secondary to CVA 3 weeks ago. PMH includes atrial fibrillation with successful cardiac ablation in 2017, COVID-19 01/2019, anxiety, s/p ORIF L elbow 07/2019 with subsequent hardware removal 08/2019, appendectomy, esophagogastroduodenoscopy, self-reported history of migraines. Pt presented to [facility] ED 12/24/19 with c/o headache and dizziness. Presented ED again on 12/25/19 with same symptoms with additional numbness of L hemibody. MRI revealed multiple lacunar infarcts near R thalamus and R posterior limb and R external capsule.    Limitations Walking;Standing    How long can you sit comfortably? n/a    How long can you stand comfortably? "Not very long"    How long can you walk comfortably? One block    Patient Stated Goals To play golf again    Currently in Pain? Yes    Pain Score 4     Pain Location Rib cage    Pain Orientation Left    Pain Descriptors / Indicators Cramping;Aching    Pain Type Chronic pain    Pain Onset In the past 7 days              Treatment: - NuStep 4 minutes, level  5 for BLE and BUE coordination. RPM > 60 for cardiovascular challenge.  - 6" step sandwiched between foam pads: stepping forward/backward with one foot staying on step. CGA for safety. Performed 5x with L foot on step with c/o discomfort at L medial meniscus. Attempted with 4" step with continued discomfort. Step removed and pt instructed to perform task with foot on Airex pad. Patient able to perform 10x each leg with minimal provocation of pain.  - 6 step sandwiched between foam pads: side step up/over. CGA for safety. 10x each side (20x total)  -  Agility ladder: forward 1 foot in each square 2x, 2 squares forward/1 square backward 2x, side stepping into/out of squares 2x. No LOB noted throughout trials.         Standing At Bar:  - Rockerboard balance 45sec A/P, 45sec M/L  - On Airex pad: DF/PF rocking 10x each direction (20x total). SPT cues for longer execution of task in order to maximize muscle activation.  - On Airex pad: Marches with fingertips on bar. 10x each leg. Patient cued to raise leg to 3 second count and return with 3 second count to challenge unilateral balance and maximize muscle activation.  Prone: - CPAs and UPAs Grade II-III to thoracic and lumbar spine. Hypomobile and painful at T9-T12. 30 seconds x 3 each level  - inferior and inferomedial grade II-IIIRib mobilizations on L x 5 minutes   SPT monitored vitals throughout session to ensure safe therapeutic ranges      Pt educated throughout session about proper posture and technique with exercises. Improved exercise                         PT Education - 02/04/20 1057    Education Details exercise technique, body mechanics, bracing    Person(s) Educated Patient    Methods Explanation;Demonstration;Tactile cues;Verbal cues    Comprehension Verbalized understanding;Returned demonstration;Tactile cues required;Verbal cues required            PT Short Term Goals - 01/17/20 1054      PT SHORT TERM GOAL #1   Title Patient will be independent in home exercise program to improve strength/mobility for better functional independence with ADLs.    Baseline HEP administered and performed at evaluation    Time 4    Period Weeks    Target Date 02/14/20      PT SHORT TERM GOAL #2   Title Patient will demonstrate 4/5 strength with all left lower extremity movements in order to maximize functional independence.    Baseline 9/10: see note    Time 4    Period Weeks    Target Date 02/14/20             PT Long Term Goals - 01/17/20 1058       PT LONG TERM GOAL #1   Title Patient will increase FOTO score to equal to or greater than 70% to demonstrate statistically significant improvement in mobility and quality of life.    Baseline 9/10 51%    Time 8    Period Weeks    Status New    Target Date 03/13/20      PT LONG TERM GOAL #2   Title Patient (< 11 years old) will complete five times sit to stand test in < 15 seconds with equal weight shift indicating an increased LE strength and improved balance.    Baseline 9/10 21 seconds    Time 8  Period Weeks    Status New    Target Date 03/13/20      PT LONG TERM GOAL #3   Title Patient will tolerate 5 seconds of single leg stance without loss of balance to improve ability to get in and out of shower safely.    Baseline 9/10 Unable to perform    Time 8    Period Weeks    Status New    Target Date 03/13/20      PT LONG TERM GOAL #4   Title Patient will increase 10 meter walk test to >1.9m/s with midline awareness as to improve gait speed for better community ambulation and to reduce fall risk.    Baseline 9/10 0.65m/s with R lean 1-2 inches    Time 8    Period Weeks    Status New    Target Date 03/13/20      PT LONG TERM GOAL #5   Title Patient will demonstrate an improved Berg Balance Score of >45 as to demonstrate improved stability with ADLs such as sitting/standing and transfer balance and reduced fall risk.    Baseline 9/10: 39/56    Time 8    Period Weeks    Status New    Target Date 03/13/20                 Plan - 02/04/20 1111    Clinical Impression Statement Patient limited by L knee pain this session. Suspected meniscal involvement based on provocation of symptoms. Deferred step up/down exercises until pain has subsided. Patient demonstrates improved coordination and balance this session with no instances of LOB. Patient will benefit from skilled PT to address strength, balance, and coordination impairments in order to maximize functional  independence at home.    Personal Factors and Comorbidities Past/Current Experience;Comorbidity 2;Time since onset of injury/illness/exacerbation    Comorbidities Atrial fibrillation, anxiety    Examination-Activity Limitations Squat;Bend;Carry;Reach Overhead;Locomotion Level;Lift;Stairs;Stand;Transfers    Examination-Participation Restrictions Occupation;Other;Community Activity;Cleaning;Laundry;Meal Prep;Yard Work;Shop   golfing, sporting activities   Stability/Clinical Decision Making Evolving/Moderate complexity    Rehab Potential Good    PT Frequency 2x / week    PT Duration 8 weeks    PT Treatment/Interventions Patient/family education;Therapeutic exercise;Functional mobility training;Gait training;Therapeutic activities;Balance training;Neuromuscular re-education;Manual techniques;ADLs/Self Care Home Management;Aquatic Therapy;Canalith Repostioning;Cryotherapy;Moist Heat;DME Instruction;Stair training;Cognitive remediation;Passive range of motion;Energy conservation;Taping;Vestibular;Visual/perceptual remediation/compensation    PT Next Visit Plan balance/gait training, LE strengthening    PT Home Exercise Plan see above    Consulted and Agree with Plan of Care Patient           Patient will benefit from skilled therapeutic intervention in order to improve the following deficits and impairments:  Abnormal gait, Decreased balance, Difficulty walking, Improper body mechanics, Impaired sensation, Decreased strength, Postural dysfunction, Decreased coordination, Decreased mobility, Cardiopulmonary status limiting activity, Decreased activity tolerance, Decreased endurance, Impaired flexibility, Impaired UE functional use  Visit Diagnosis: Unsteadiness on feet  Weakness of left lower extremity  Other abnormalities of gait and mobility     Problem List Patient Active Problem List   Diagnosis Date Noted   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 I have personally read, edited and approve of the note as written.  Janna Arch, PT, DPT   02/04/2020, 11:12 AM  [place] MAIN [facility] SERVICES [address] [place], [place], [address] Phone: [phone]   Fax:  [phone]  Name: Carlos Nolan MRN: [id number] Date of Birth: Oct 06, 1965

## 2020-02-05 ENCOUNTER — Encounter: Payer: 59 | Admitting: Occupational Therapy

## 2020-02-05 ENCOUNTER — Ambulatory Visit: Payer: 59

## 2020-02-07 ENCOUNTER — Encounter: Payer: 59 | Admitting: Occupational Therapy

## 2020-02-07 ENCOUNTER — Ambulatory Visit: Payer: 59

## 2020-02-10 ENCOUNTER — Encounter: Payer: Self-pay | Admitting: Physical Therapy

## 2020-02-10 ENCOUNTER — Encounter: Payer: Self-pay | Admitting: Occupational Therapy

## 2020-02-10 ENCOUNTER — Ambulatory Visit: Payer: 59 | Attending: Neurology | Admitting: Occupational Therapy

## 2020-02-10 ENCOUNTER — Other Ambulatory Visit: Payer: Self-pay

## 2020-02-10 ENCOUNTER — Ambulatory Visit: Payer: 59 | Admitting: Physical Therapy

## 2020-02-10 DIAGNOSIS — R2681 Unsteadiness on feet: Secondary | ICD-10-CM | POA: Insufficient documentation

## 2020-02-10 DIAGNOSIS — R278 Other lack of coordination: Secondary | ICD-10-CM | POA: Insufficient documentation

## 2020-02-10 DIAGNOSIS — R2689 Other abnormalities of gait and mobility: Secondary | ICD-10-CM | POA: Diagnosis present

## 2020-02-10 DIAGNOSIS — R29898 Other symptoms and signs involving the musculoskeletal system: Secondary | ICD-10-CM | POA: Diagnosis present

## 2020-02-10 DIAGNOSIS — M6281 Muscle weakness (generalized): Secondary | ICD-10-CM | POA: Insufficient documentation

## 2020-02-10 NOTE — Therapy (Signed)
[place] MAIN [facility] SERVICES [address] [place], [place], [address] Phone: [phone]   Fax:  [phone]  Occupational Therapy Treatment  Patient Details  Name: Carlos Nolan MRN: [id number] Date of Birth: 06-11-1965 No data recorded  Encounter Date: 02/10/2020   OT End of Session - 02/10/20 1152    Visit Number 7    Number of Visits 24    Date for OT Re-Evaluation 04/10/20    OT Start Time 1103    OT Stop Time 1145    OT Time Calculation (min) 42 min    Activity Tolerance Patient tolerated treatment well    Behavior During Therapy [facility] for tasks assessed/performed           Past Medical History:  Diagnosis Date  . Atrial fibrillation ([place])   . COVID-19 01/2019   Sept 2020    Past Surgical History:  Procedure Laterality Date  . APPENDECTOMY    . CARDIAC ELECTROPHYSIOLOGY STUDY AND ABLATION  04/24/2015  . ELBOW FRACTURE SURGERY    . ESOPHAGOGASTRODUODENOSCOPY N/A 01/11/2016   Procedure: ESOPHAGOGASTRODUODENOSCOPY (EGD);  Surgeon: Mauri Pole, MD;  Location: [facility];  Service: Endoscopy;  Laterality: N/A;  . FEMUR FRACTURE SURGERY    . HARDWARE REMOVAL Left 08/22/2019   Procedure: HARDWARE REMOVAL FROM LEFT ELBOW;  Surgeon: Corky Mull, MD;  Location: [facility];  Service: Orthopedics;  Laterality: Left;  . LOOP RECORDER INSERTION N/A 01/21/2020   Procedure: LOOP RECORDER INSERTION;  Surgeon: Isaias Cowman, MD;  Location: [place] CV LAB;  Service: Cardiovascular;  Laterality: N/A;  . SHOULDER ARTHROSCOPY Right 06/15/2016   Procedure: Arthroscopic labral debridement, arthroscopic subacromial decompression, and mini-open bursectomy with exploration of rotator cuff, right shoulder;  Surgeon: Corky Mull, MD;  Location: [place];  Service: Orthopedics;  Laterality: Right;    There were no vitals filed for this visit.  OT TREATMENT    Neuro muscular re-education:  Pt. worked on left hand [facility]  skills grasping 1" resistive cubes alternating thumb opposition to the tip of the 2nd through 5th digits while the board is placed to the left beside him. Pt. worked on pressing the cubes back into place while alternating isolated 2nd through 5th digit extension with the board positioned at a vertical angle. Pt. worked on Financial planner 1/2" circular tipped pegs, and reaching out into abduction to place them onto a board, after moving them through his hand from his palm to the tip od his 2nd digit, and thumb. Pt. worked on removing the pegs using a green level resitsive clip with his left hand. Pt. Worked on grasping 2" sticks, and placing them into a board positioned elevated to the left. Pt. worked on using green level resistive clips to remove the sticks.  Therapeutic Exercise:  Pt. was upgraded, and provided with blue theraputty for gross gripping. Pt. worked on the DigiFlex 7.0# while alternating digits, and attempting 2nd and 4th digit simultaneously.   Pt. continues to use his LUE more during tasks at home.  Pt. continues to have difficulty reaching posteriorly to grasp a wallet from his pocket, and has difficulty with motor control using the ATM while reaching out to the side in abduction to press the buttons. Pt. Continues to work on improving LUE strength, and [facility] skill in order to work towards increasing engagement of the LUE during more ADLs, and IADL tasks.  OT Education - 02/10/20 1152    Education Details [place], speed, dexterity    Person(s) Educated Patient    Methods Demonstration;Explanation    Comprehension Verbalized understanding;Returned demonstration               OT Long Term Goals - 01/17/20 2024      OT LONG TERM GOAL #1   Title Pt will be independent with home exercise program for strength, ROM and coordination    Baseline will need modifications to current program from home health    Time 12    Status New    Target Date  04/10/20      OT LONG TERM GOAL #2   Title Pt to improve grip by 10# to be able to hold objects without dropping    Baseline left grip 47# at eval    Time 12    Period Weeks    Status New    Target Date 04/10/20      OT LONG TERM GOAL #3   Title Pt will complete cutting and chopping food for meal prep and self feeding with modified independence    Baseline difficulty with cutting food at eval    Time 12    Period Weeks    Status New    Target Date 04/10/20      OT LONG TERM GOAL #4   Title Pt will improve FOTO score by 10 points to show a clinically relevant change in ability to perform necessary self care and IADL tasks with greater ease during ADL tasks.    Baseline eval FOTO 55    Time 12    Period Weeks    Status New    Target Date 04/10/20      OT LONG TERM GOAL #5   Title Pt will demonstrate use of keyboard with bilateral UEs with good speed and minimal errors.    Baseline difficulty with use of left hand with typing.    Time 12    Period Weeks    Status New    Target Date 04/10/20      Long Term Additional Goals   Additional Long Term Goals Yes      OT LONG TERM GOAL #6   Title Pt will demonstrate secure grip on golf club during all phases of swing.    Baseline eval difficulty with gripping patterns    Time 12    Period Weeks    Status New    Target Date 04/10/20      OT LONG TERM GOAL #7   Title Patient will demonstrate improved grip to squeeze out toothpaste with left hand with modified independence.    Baseline eval difficulty    Time 6    Period Weeks    Status New    Target Date 02/28/20                 Plan - 02/10/20 1153    Clinical Impression Statement Pt. continues to use his LUE more during tasks at home.  Pt. continues to have difficulty reaching posteriorly to grasp a wallet from his pocket, and has difficulty with motor control using the ATM while reaching out to the side in abduction to press the buttons. Pt. Continues to work on  improving LUE strength, and [facility] skill in order to work towards increasing engagement of the LUE during more ADLs, and IADL tasks.   OT Occupational Profile and History Detailed Assessment- Review of Records and additional review  of physical, cognitive, psychosocial history related to current functional performance    Occupational performance deficits (Please refer to evaluation for details): ADL's;Work;IADL's;Leisure;Social Participation    Body Structure / Function / Physical Skills ADL;Dexterity;Flexibility;ROM;Strength;Balance;Coordination;FMC;IADL;UE functional use;GMC    Psychosocial Skills Environmental  Adaptations;Routines and Behaviors;Habits    Rehab Potential Excellent    Clinical Decision Making Limited treatment options, no task modification necessary    Comorbidities Affecting Occupational Performance: May have comorbidities impacting occupational performance    Modification or Assistance to Complete Evaluation  No modification of tasks or assist necessary to complete eval    OT Frequency 2x / week    OT Duration 12 weeks    OT Treatment/Interventions Self-care/ADL training;Cryotherapy;Therapeutic exercise;DME and/or AE instruction;Balance training;Neuromuscular education;Manual Therapy;Moist Heat;Contrast Bath;Therapeutic activities;Patient/family education    Consulted and Agree with Plan of Care Patient           Patient will benefit from skilled therapeutic intervention in order to improve the following deficits and impairments:   Body Structure / Function / Physical Skills: ADL, Dexterity, Flexibility, ROM, Strength, Balance, Coordination, FMC, IADL, UE functional use, GMC   Psychosocial Skills: Environmental  Adaptations, Routines and Behaviors, Habits   Visit Diagnosis: Muscle weakness (generalized)  Other lack of coordination    Problem List Patient Active Problem List   Diagnosis Date Noted  . 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 Acute thalamic infarction  ([place]) 12/25/2019  . S/P ablation of atrial fibrillation 12/25/2019  . Food impaction of esophagus     Harrel Carina, MS, OTR/L 02/10/2020, 11:55 AM  [place] MAIN [facility] SERVICES [address] [place], [place], [address] Phone: [phone]   Fax:  [phone]  Name: Carlos Nolan MRN: [id number] Date of Birth: Jun 23, 1965

## 2020-02-10 NOTE — Therapy (Cosign Needed)
[place] MAIN [facility] SERVICES [address] [place], [place], [address] Phone: [phone]   Fax:  [phone]  Physical Therapy Treatment  Patient Details  Name: HAYWOOD MEINDERS MRN: [id number] Date of Birth: 1966-04-17 Referring Provider (PT): Anabel Bene   Encounter Date: 02/10/2020   PT End of Session - 02/10/20 1109    Visit Number 7    Number of Visits 17    Date for PT Re-Evaluation 03/13/20    Authorization Type 7/10 eval 01/17/20    PT Start Time 1145    PT Stop Time 1229    PT Time Calculation (min) 44 min    Equipment Utilized During Treatment Gait belt    Activity Tolerance Patient tolerated treatment well    Behavior During Therapy [facility] for tasks assessed/performed           Past Medical History:  Diagnosis Date  . Atrial fibrillation ([place])   . COVID-19 01/2019   Sept 2020    Past Surgical History:  Procedure Laterality Date  . APPENDECTOMY    . CARDIAC ELECTROPHYSIOLOGY STUDY AND ABLATION  04/24/2015  . ELBOW FRACTURE SURGERY    . ESOPHAGOGASTRODUODENOSCOPY N/A 01/11/2016   Procedure: ESOPHAGOGASTRODUODENOSCOPY (EGD);  Surgeon: Mauri Pole, MD;  Location: [facility];  Service: Endoscopy;  Laterality: N/A;  . FEMUR FRACTURE SURGERY    . HARDWARE REMOVAL Left 08/22/2019   Procedure: HARDWARE REMOVAL FROM LEFT ELBOW;  Surgeon: Corky Mull, MD;  Location: [facility];  Service: Orthopedics;  Laterality: Left;  . LOOP RECORDER INSERTION N/A 01/21/2020   Procedure: LOOP RECORDER INSERTION;  Surgeon: Isaias Cowman, MD;  Location: [place] [facility];  Service: Cardiovascular;  Laterality: N/A;  . SHOULDER ARTHROSCOPY Right 06/15/2016   Procedure: Arthroscopic labral debridement, arthroscopic subacromial decompression, and mini-open bursectomy with exploration of rotator cuff, right shoulder;  Surgeon: Corky Mull, MD;  Location: [place];  Service: Orthopedics;  Laterality: Right;    There  were no vitals filed for this visit.   Subjective Assessment - 02/10/20 1147    Subjective Patient denies falls or LOB since last session. Patient came back from the beach this past weekend, noting that walking on sand felt "strange" due to lack of sensation on LLE.    Pertinent History Patient presents to physical therapy unaccompanied and without AD.  Patient notes L sided weakness and sensory loss secondary to CVA 3 weeks ago. PMH includes atrial fibrillation with successful cardiac ablation in 2017, COVID-19 01/2019, anxiety, s/p ORIF L elbow 07/2019 with subsequent hardware removal 08/2019, appendectomy, esophagogastroduodenoscopy, self-reported history of migraines. Pt presented to [facility] ED 12/24/19 with c/o headache and dizziness. Presented ED again on 12/25/19 with same symptoms with additional numbness of L hemibody. MRI revealed multiple lacunar infarcts near R thalamus and R posterior limb and R external capsule.    Limitations Walking;Standing    How long can you sit comfortably? n/a    How long can you stand comfortably? "Not very long"    How long can you walk comfortably? One block    Patient Stated Goals To play golf again    Currently in Pain? Yes    Pain Score 5     Pain Location Rib cage    Pain Orientation Left    Pain Descriptors / Indicators Aching;Sore    Pain Type Chronic pain    Pain Onset In the past 7 days    Pain Frequency Intermittent  This entire session was performed under direct supervision and direction of a licensed therapist/therapist assistant . I have personally read, edited and approve of the note as written.    Treatment: - Octane 5 minutes, level 4 for BLE and BUE coordination. RPM > 50 for cardiovascular challenge.    - Agility ladder: forward 1 foot in each square 4x, side stepping into/out of squares 4x. Speed of execution increased to challenge coordination.   - Sit to stand with 6' step under RLE to promote weight acceptance on L. 10x 2  sets.       Standing At Bar:   - Rockerboard balance 45sec A/P, 45sec M/L   - On Airex pad: Eccentric heel raises 12x each leg. Fingertips on bar for stability.     Supine with 4# Ankle Weights: - Seated marches 12x each leg. SPT cues for slow return to maximize eccentric muscle activation  - Long arc quads 12x each leg, 3 second holds   Prone: - CPAs and UPAs Grade II-III to thoracic and lumbar spine. Hypomobile and painful at T9-T12. 30 seconds x 3 each level   - Inferior and inferomedial grade II-III Rib mobilizations on L x 5 minutes   - STM L paraspinals to decrease guarding to allow for increased mobilizations x 3 minutes   SPT monitored vitals throughout session to ensure safe therapeutic ranges                       PT Education - 02/10/20 1108    Education Details body mechanics, exercise technique    Person(s) Educated Patient    Methods Explanation;Demonstration;Tactile cues;Verbal cues    Comprehension Verbalized understanding;Returned demonstration;Verbal cues required;Tactile cues required            PT Short Term Goals - 01/17/20 1054      PT SHORT TERM GOAL #1   Title Patient will be independent in home exercise program to improve strength/mobility for better functional independence with ADLs.    Baseline HEP administered and performed at evaluation    Time 4    Period Weeks    Target Date 02/14/20      PT SHORT TERM GOAL #2   Title Patient will demonstrate 4/5 strength with all left lower extremity movements in order to maximize functional independence.    Baseline 9/10: see note    Time 4    Period Weeks    Target Date 02/14/20             PT Long Term Goals - 01/17/20 1058      PT LONG TERM GOAL #1   Title Patient will increase FOTO score to equal to or greater than 70% to demonstrate statistically significant improvement in mobility and quality of life.    Baseline 9/10 51%    Time 8    Period Weeks    Status New     Target Date 03/13/20      PT LONG TERM GOAL #2   Title Patient (< 52 years old) will complete five times sit to stand test in < 15 seconds with equal weight shift indicating an increased LE strength and improved balance.    Baseline 9/10 21 seconds    Time 8    Period Weeks    Status New    Target Date 03/13/20      PT LONG TERM GOAL #3   Title Patient will tolerate 5 seconds of single leg stance without loss  of balance to improve ability to get in and out of shower safely.    Baseline 9/10 Unable to perform    Time 8    Period Weeks    Status New    Target Date 03/13/20      PT LONG TERM GOAL #4   Title Patient will increase 10 meter walk test to >1.47m/s with midline awareness as to improve gait speed for better community ambulation and to reduce fall risk.    Baseline 9/10 0.62m/s with R lean 1-2 inches    Time 8    Period Weeks    Status New    Target Date 03/13/20      PT LONG TERM GOAL #5   Title Patient will demonstrate an improved Berg Balance Score of >45 as to demonstrate improved stability with ADLs such as sitting/standing and transfer balance and reduced fall risk.    Baseline 9/10: 39/56    Time 8    Period Weeks    Status New    Target Date 03/13/20                 Plan - 02/10/20 1236    Clinical Impression Statement Patient demonstrates improved balance and coordination this session, with no LOB noted during fast-paced agility tasks. Open chain LE strengthening does not provoke meniscal pain. Patient continues to note discomfort around L medial meniscus during frontal plane movements. Patient demonstrates increased R lean this session. Patient will benefit from skilled PT to address strength, balance, and coordination impairments in order to maximize functional independence at home.    Personal Factors and Comorbidities Past/Current Experience;Comorbidity 2;Time since onset of injury/illness/exacerbation    Comorbidities Atrial fibrillation, anxiety      Examination-Activity Limitations Squat;Bend;Carry;Reach Overhead;Locomotion Level;Lift;Stairs;Stand;Transfers    Examination-Participation Restrictions Occupation;Other;Community Activity;Cleaning;Laundry;Meal Prep;Yard Work;Shop   golfing, sporting activities   Stability/Clinical Decision Making Evolving/Moderate complexity    Rehab Potential Good    PT Frequency 2x / week    PT Duration 8 weeks    PT Treatment/Interventions Patient/family education;Therapeutic exercise;Functional mobility training;Gait training;Therapeutic activities;Balance training;Neuromuscular re-education;Manual techniques;ADLs/Self Care Home Management;Aquatic Therapy;Canalith Repostioning;Cryotherapy;Moist Heat;DME Instruction;Stair training;Cognitive remediation;Passive range of motion;Energy conservation;Taping;Vestibular;Visual/perceptual remediation/compensation    PT Next Visit Plan balance/gait training, LE strengthening    PT Home Exercise Plan see above    Consulted and Agree with Plan of Care Patient           Patient will benefit from skilled therapeutic intervention in order to improve the following deficits and impairments:  Abnormal gait, Decreased balance, Difficulty walking, Improper body mechanics, Impaired sensation, Decreased strength, Postural dysfunction, Decreased coordination, Decreased mobility, Cardiopulmonary status limiting activity, Decreased activity tolerance, Decreased endurance, Impaired flexibility, Impaired UE functional use  Visit Diagnosis: Unsteadiness on feet  Weakness of left lower extremity  Other abnormalities of gait and mobility     Problem List Patient Active Problem List   Diagnosis Date Noted  . 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 Acute thalamic infarction ([place]) 12/25/2019  . S/P ablation of atrial fibrillation 12/25/2019  . Food impaction of esophagus    Tonny Bollman, SPT  02/10/2020, 12:37 PM  [place] MAIN [facility]  SERVICES [address] [place], [place], [address] Phone: [phone]   Fax:  [phone]  Name: ABEDNEGO YEATES MRN: [id number] Date of Birth: 09-22-1965

## 2020-02-12 ENCOUNTER — Ambulatory Visit: Payer: 59

## 2020-02-12 ENCOUNTER — Ambulatory Visit: Payer: 59 | Admitting: Occupational Therapy

## 2020-02-12 ENCOUNTER — Other Ambulatory Visit: Payer: Self-pay

## 2020-02-12 ENCOUNTER — Encounter: Payer: Self-pay | Admitting: Occupational Therapy

## 2020-02-12 DIAGNOSIS — R2681 Unsteadiness on feet: Secondary | ICD-10-CM

## 2020-02-12 DIAGNOSIS — R29898 Other symptoms and signs involving the musculoskeletal system: Secondary | ICD-10-CM

## 2020-02-12 DIAGNOSIS — M6281 Muscle weakness (generalized): Secondary | ICD-10-CM

## 2020-02-12 DIAGNOSIS — R2689 Other abnormalities of gait and mobility: Secondary | ICD-10-CM

## 2020-02-12 DIAGNOSIS — R278 Other lack of coordination: Secondary | ICD-10-CM

## 2020-02-12 NOTE — Therapy (Signed)
[place] MAIN [facility] SERVICES [address] [place], [place], [address] Phone: [phone]   Fax:  [phone]  Occupational Therapy Treatment  Patient Details  Name: Carlos Nolan MRN: [id number] Date of Birth: Feb 29, 1966 No data recorded  Encounter Date: 02/12/2020   OT End of Session - 02/12/20 1609    Visit Number 8    Number of Visits 24    Date for OT Re-Evaluation 04/10/20    OT Start Time 1145    OT Stop Time 1230    OT Time Calculation (min) 45 min    Activity Tolerance Patient tolerated treatment well    Behavior During Therapy [facility] for tasks assessed/performed           Past Medical History:  Diagnosis Date  . Atrial fibrillation ([place])   . COVID-19 01/2019   Sept 2020    Past Surgical History:  Procedure Laterality Date  . APPENDECTOMY    . CARDIAC ELECTROPHYSIOLOGY STUDY AND ABLATION  04/24/2015  . ELBOW FRACTURE SURGERY    . ESOPHAGOGASTRODUODENOSCOPY N/A 01/11/2016   Procedure: ESOPHAGOGASTRODUODENOSCOPY (EGD);  Surgeon: Mauri Pole, MD;  Location: [facility];  Service: Endoscopy;  Laterality: N/A;  . FEMUR FRACTURE SURGERY    . HARDWARE REMOVAL Left 08/22/2019   Procedure: HARDWARE REMOVAL FROM LEFT ELBOW;  Surgeon: Corky Mull, MD;  Location: [facility];  Service: Orthopedics;  Laterality: Left;  . LOOP RECORDER INSERTION N/A 01/21/2020   Procedure: LOOP RECORDER INSERTION;  Surgeon: Isaias Cowman, MD;  Location: [place] CV LAB;  Service: Cardiovascular;  Laterality: N/A;  . SHOULDER ARTHROSCOPY Right 06/15/2016   Procedure: Arthroscopic labral debridement, arthroscopic subacromial decompression, and mini-open bursectomy with exploration of rotator cuff, right shoulder;  Surgeon: Corky Mull, MD;  Location: [place];  Service: Orthopedics;  Laterality: Right;    There were no vitals filed for this visit.   Subjective Assessment - 02/12/20 1607    Subjective  Pt. reports that he is  going to [place] for a vacation next week.    Patient Stated Goals Pt reports he would like to be able to golf again, be as independent as possible.    Currently in Pain? No/denies          There were no vitals filed for this visit.  OT TREATMENT    Neuro muscular re-education:  Pt. worked on grasping 1" thin sticks using resistive tweezers on a tweezer dexterity task. Pt. required cues for visual demonstration of proper technique, cues to avoid compensating, and hiking his left shoulder, and increased time to complete.   Therapeutic Exercise:  Pt. Worked on pinch strengthening in the left hand for lateral, and 3pt. pinch using yellow, red, green, blue, and black resistive clips. Pt. worked on placing the clips at various vertical and horizontal angles. Tactile and verbal cues were required for eliciting the desired movement.Pt. Performed UE strengthening using resistive EZ Board exercises for forearm supination/pronation, wrist flexion/extension using gross grasp, lateral pinch (key) grasp, and dial grasp. Pt. performed resistive EZ Board exercises angled in several planes to promote shoulder flexion, abduction, and wrist flexion, and extension while performing resistive wrist flexion and extension with a gross grip.  Pt. performed gross gripping with grip strengthener. Pt. worked on sustaining grip while grasping pegs and reaching at various heights. The gripper was set at 23.4# of grip strength force. Pt. worked on Probation officer 45.2#, 45#, 44.8#, and 43.8#  Pt. is planning a trip to [place] with his family next week. Pt. is making progress with LUE functioning, and continues to use his LUE more during tasks at home. Pt. required increased cues to avoid compensation proximally by hiking his left shoulder during [facility] tasks, and hand strengthening tasks. Pt. Continues to work on improving LUE strength, and [facility] skills in order to work towards  increasing engagement of the LUE during more ADLs, and IADL tasks.                         OT Education - 02/12/20 1609    Education Details [place], speed, dexterity    Person(s) Educated Patient    Comprehension Verbalized understanding;Returned demonstration               OT Long Term Goals - 01/17/20 2024      OT LONG TERM GOAL #1   Title Pt will be independent with home exercise program for strength, ROM and coordination    Baseline will need modifications to current program from home health    Time 12    Status New    Target Date 04/10/20      OT LONG TERM GOAL #2   Title Pt to improve grip by 10# to be able to hold objects without dropping    Baseline left grip 47# at eval    Time 12    Period Weeks    Status New    Target Date 04/10/20      OT LONG TERM GOAL #3   Title Pt will complete cutting and chopping food for meal prep and self feeding with modified independence    Baseline difficulty with cutting food at eval    Time 12    Period Weeks    Status New    Target Date 04/10/20      OT LONG TERM GOAL #4   Title Pt will improve FOTO score by 10 points to show a clinically relevant change in ability to perform necessary self care and IADL tasks with greater ease during ADL tasks.    Baseline eval FOTO 55    Time 12    Period Weeks    Status New    Target Date 04/10/20      OT LONG TERM GOAL #5   Title Pt will demonstrate use of keyboard with bilateral UEs with good speed and minimal errors.    Baseline difficulty with use of left hand with typing.    Time 12    Period Weeks    Status New    Target Date 04/10/20      Long Term Additional Goals   Additional Long Term Goals Yes      OT LONG TERM GOAL #6   Title Pt will demonstrate secure grip on golf club during all phases of swing.    Baseline eval difficulty with gripping patterns    Time 12    Period Weeks    Status New    Target Date 04/10/20      OT LONG TERM GOAL #7    Title Patient will demonstrate improved grip to squeeze out toothpaste with left hand with modified independence.    Baseline eval difficulty    Time 6    Period Weeks    Status New    Target Date 02/28/20                 Plan -  02/12/20 1609    Clinical Impression Statement Pt. is planning a trip to [place] with his family next week. Pt. is making progress with LUE functioning, and continues to use his LUE more during tasks at home. Pt. required increased cues to avoid compensation proximally by hiking his left shoulder during [facility] tasks, and hand strengthening tasks. Pt. Continues to work on improving LUE strength, and [facility] skills in order to work towards increasing engagement of the LUE during more ADLs, and IADL tasks.   OT Occupational Profile and History Detailed Assessment- Review of Records and additional review of physical, cognitive, psychosocial history related to current functional performance    Occupational performance deficits (Please refer to evaluation for details): ADL's;Work;IADL's;Leisure;Social Participation    Body Structure / Function / Physical Skills ADL;Dexterity;Flexibility;ROM;Strength;Balance;Coordination;FMC;IADL;UE functional use;GMC    Psychosocial Skills Environmental  Adaptations;Routines and Behaviors;Habits    Rehab Potential Excellent    Clinical Decision Making Limited treatment options, no task modification necessary    Comorbidities Affecting Occupational Performance: May have comorbidities impacting occupational performance    Modification or Assistance to Complete Evaluation  No modification of tasks or assist necessary to complete eval    OT Frequency 2x / week    OT Duration 12 weeks    OT Treatment/Interventions Self-care/ADL training;Cryotherapy;Therapeutic exercise;DME and/or AE instruction;Balance training;Neuromuscular education;Manual Therapy;Moist Heat;Contrast Bath;Therapeutic activities;Patient/family education    Consulted and  Agree with Plan of Care Patient           Patient will benefit from skilled therapeutic intervention in order to improve the following deficits and impairments:   Body Structure / Function / Physical Skills: ADL, Dexterity, Flexibility, ROM, Strength, Balance, Coordination, FMC, IADL, UE functional use, GMC   Psychosocial Skills: Environmental  Adaptations, Routines and Behaviors, Habits   Visit Diagnosis: Muscle weakness (generalized)    Problem List Patient Active Problem List   Diagnosis Date Noted  . 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 Acute thalamic infarction ([place]) 12/25/2019  . S/P ablation of atrial fibrillation 12/25/2019  . Food impaction of esophagus     Harrel Carina, MS, OTR/L 02/12/2020, 4:12 PM  [place] MAIN [facility] SERVICES [address] [place], [place], [address] Phone: [phone]   Fax:  [phone]  Name: Carlos Nolan MRN: [id number] Date of Birth: [birth year]

## 2020-02-12 NOTE — Therapy (Signed)
[place] MAIN [facility] SERVICES [address] [place], [place], [address] Phone: [phone]   Fax:  [phone]  Physical Therapy Treatment  Patient Details  Name: Carlos Nolan MRN: [id number] Date of Birth: 02-Sep-1965 Referring Provider (PT): Anabel Bene   Encounter Date: 02/12/2020   PT End of Session - 02/12/20 1111    Visit Number 8    Number of Visits 17    Date for PT Re-Evaluation 03/13/20    PT Start Time 1106    PT Stop Time 1144    PT Time Calculation (min) 38 min    Equipment Utilized During Treatment Gait belt    Activity Tolerance Patient tolerated treatment well;No increased pain    Behavior During Therapy WFL for tasks assessed/performed           Past Medical History:  Diagnosis Date  . Atrial fibrillation ([place])   . COVID-19 01/2019   Sept 2020    Past Surgical History:  Procedure Laterality Date  . APPENDECTOMY    . CARDIAC ELECTROPHYSIOLOGY STUDY AND ABLATION  04/24/2015  . ELBOW FRACTURE SURGERY    . ESOPHAGOGASTRODUODENOSCOPY N/A 01/11/2016   Procedure: ESOPHAGOGASTRODUODENOSCOPY (EGD);  Surgeon: Mauri Pole, MD;  Location: [facility];  Service: Endoscopy;  Laterality: N/A;  . FEMUR FRACTURE SURGERY    . HARDWARE REMOVAL Left 08/22/2019   Procedure: HARDWARE REMOVAL FROM LEFT ELBOW;  Surgeon: Corky Mull, MD;  Location: [facility];  Service: Orthopedics;  Laterality: Left;  . LOOP RECORDER INSERTION N/A 01/21/2020   Procedure: LOOP RECORDER INSERTION;  Surgeon: Isaias Cowman, MD;  Location: [place] [facility];  Service: Cardiovascular;  Laterality: N/A;  . SHOULDER ARTHROSCOPY Right 06/15/2016   Procedure: Arthroscopic labral debridement, arthroscopic subacromial decompression, and mini-open bursectomy with exploration of rotator cuff, right shoulder;  Surgeon: Corky Mull, MD;  Location: [place];  Service: Orthopedics;  Laterality: Right;    There were no vitals filed for  this visit.   Subjective Assessment - 02/12/20 1108    Subjective Pt doing well in general, similar pain at Left flank. Pt has been working on balance with Wii at home. He is headed to [employer] next week.    Pertinent History Patient presents to physical therapy unaccompanied and without AD.  Patient notes L sided weakness and sensory loss secondary to CVA 3 weeks ago. PMH includes atrial fibrillation with successful cardiac ablation in 2017, COVID-19 01/2019, anxiety, s/p ORIF L elbow 07/2019 with subsequent hardware removal 08/2019, appendectomy, esophagogastroduodenoscopy, self-reported history of migraines. Pt presented to [facility] ED 12/24/19 with c/o headache and dizziness. Presented ED again on 12/25/19 with same symptoms with additional numbness of L hemibody. MRI revealed multiple lacunar infarcts near R thalamus and R posterior limb and R external capsule.    Limitations Walking;Standing    How long can you sit comfortably? n/a    How long can you stand comfortably? "Not very long"    How long can you walk comfortably? One block    Patient Stated Goals To play golf again    Currently in Pain? Yes    Pain Score 2     Pain Location Rib cage    Pain Orientation Left           INTERVENTION THIS DATE:  -Octane 5 minutes, level 4 for BLE and BUE coordination. RPM > 50 for cardiovascular challenge -left MFR to serratus posterior inferior Ribs  8, 9, 10.  -seated Right chair  rotation stretch 2x30sec (cues to moderate effort, and easy breathing maintained)    -sit to stand with 6' step under RLE to promote weight acceptance on Lt 10x 2 sets -standing marching c 4lbAW 1x20 alternating pattern, single UE support, cues for isometric core -standing heel raises 1x20 bilat synchronous 4lb AW  -normal stance airex  With PNF UE patterns with physioball x10 bilat (pt cvery cautious, encouraged to be more exploratory to find the limits of his stability)  -double airex pad (wide) golf swing with SPC x15  (cues to increased speed to threshold of stability)       PT Short Term Goals - 01/17/20 1054      PT SHORT TERM GOAL #1   Title Patient will be independent in home exercise program to improve strength/mobility for better functional independence with ADLs.    Baseline HEP administered and performed at evaluation    Time 4    Period Weeks    Target Date 02/14/20      PT SHORT TERM GOAL #2   Title Patient will demonstrate 4/5 strength with all left lower extremity movements in order to maximize functional independence.    Baseline 9/10: see note    Time 4    Period Weeks    Target Date 02/14/20             PT Long Term Goals - 01/17/20 1058      PT LONG TERM GOAL #1   Title Patient will increase FOTO score to equal to or greater than 70% to demonstrate statistically significant improvement in mobility and quality of life.    Baseline 9/10 51%    Time 8    Period Weeks    Status New    Target Date 03/13/20      PT LONG TERM GOAL #2   Title Patient (< 110 years old) will complete five times sit to stand test in < 15 seconds with equal weight shift indicating an increased LE strength and improved balance.    Baseline 9/10 21 seconds    Time 8    Period Weeks    Status New    Target Date 03/13/20      PT LONG TERM GOAL #3   Title Patient will tolerate 5 seconds of single leg stance without loss of balance to improve ability to get in and out of shower safely.    Baseline 9/10 Unable to perform    Time 8    Period Weeks    Status New    Target Date 03/13/20      PT LONG TERM GOAL #4   Title Patient will increase 10 meter walk test to >1.33m/s with midline awareness as to improve gait speed for better community ambulation and to reduce fall risk.    Baseline 9/10 0.4m/s with R lean 1-2 inches    Time 8    Period Weeks    Status New    Target Date 03/13/20      PT LONG TERM GOAL #5   Title Patient will demonstrate an improved Berg Balance Score of >45 as to  demonstrate improved stability with ADLs such as sitting/standing and transfer balance and reduced fall risk.    Baseline 9/10: 39/56    Time 8    Period Weeks    Status New    Target Date 03/13/20                 Plan - 02/12/20 1112  Clinical Impression Statement Continued with current POC. Began with some myofascial work to area of chronic tightness/discomfort, seems most consistent with serratus posterior inferior with intercostals being a DD, good response to sustained release and palpable increased muscle length improvement. Immediately followed up with chair rotation stretch which seems to target this area well and provide some improved perception of mobility of trunk. Continued with LE strengthening and motor control training. Ended with Airex foam multiplanar trunk movements and golf swing simulation. Pt reports no significant fatigue and denies increased pain. Pt encouraged to try chair rotation stretch again at home if he perceives it to be palliative. Pt continues to make progress toward goals in general.    Personal Factors and Comorbidities Past/Current Experience;Comorbidity 2;Time since onset of injury/illness/exacerbation    Comorbidities Atrial fibrillation, anxiety    Examination-Activity Limitations Squat;Bend;Carry;Reach Overhead;Locomotion Level;Lift;Stairs;Stand;Transfers    Examination-Participation Restrictions Community Activity    Stability/Clinical Decision Making Evolving/Moderate complexity    Clinical Decision Making Moderate    Rehab Potential Good    PT Frequency 2x / week    PT Duration 8 weeks    PT Treatment/Interventions Patient/family education;Therapeutic exercise;Functional mobility training;Gait training;Therapeutic activities;Balance training;Neuromuscular re-education;Manual techniques;ADLs/Self Care Home Management;Aquatic Therapy;Canalith Repostioning;Cryotherapy;Moist Heat;DME Instruction;Stair training;Cognitive remediation;Passive range of  motion;Energy conservation;Taping;Vestibular;Visual/perceptual remediation/compensation    PT Next Visit Plan balance/gait training, LE strengthening    PT Home Exercise Plan see above    Consulted and Agree with Plan of Care Patient           Patient will benefit from skilled therapeutic intervention in order to improve the following deficits and impairments:  Abnormal gait, Decreased balance, Difficulty walking, Improper body mechanics, Impaired sensation, Decreased strength, Postural dysfunction, Decreased coordination, Decreased mobility, Cardiopulmonary status limiting activity, Decreased activity tolerance, Decreased endurance, Impaired flexibility, Impaired UE functional use  Visit Diagnosis: Unsteadiness on feet  Weakness of left lower extremity  Other abnormalities of gait and mobility  Muscle weakness (generalized)  Other lack of coordination     Problem List Patient Active Problem List   Diagnosis Date Noted  . 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 Acute thalamic infarction ([place]) 12/25/2019  . S/P ablation of atrial fibrillation 12/25/2019  . Food impaction of esophagus    11:55 AM, 02/12/20 Etta Grandchild, PT, DPT Physical Therapist - [facility]  Outpatient Physical Therapy- [place] [phone]     Etta Grandchild 02/12/2020, 11:25 AM  [place] MAIN [facility] SERVICES [address] [place], [place], [address] Phone: [phone]   Fax:  [phone]  Name: Carlos Nolan MRN: [id number] Date of Birth: [birth year]

## 2020-02-13 ENCOUNTER — Ambulatory Visit: Payer: 59 | Admitting: Physical Therapy

## 2020-02-13 ENCOUNTER — Encounter: Payer: 59 | Admitting: Occupational Therapy

## 2020-02-17 ENCOUNTER — Encounter: Payer: 59 | Admitting: Occupational Therapy

## 2020-02-17 ENCOUNTER — Ambulatory Visit: Payer: 59

## 2020-02-17 ENCOUNTER — Ambulatory Visit: Payer: 59 | Admitting: Occupational Therapy

## 2020-02-20 ENCOUNTER — Encounter: Payer: 59 | Admitting: Occupational Therapy

## 2020-02-20 ENCOUNTER — Ambulatory Visit: Payer: 59

## 2020-02-24 ENCOUNTER — Ambulatory Visit: Payer: 59

## 2020-02-24 ENCOUNTER — Other Ambulatory Visit: Payer: Self-pay

## 2020-02-24 ENCOUNTER — Ambulatory Visit: Payer: 59 | Admitting: Occupational Therapy

## 2020-02-24 ENCOUNTER — Encounter: Payer: Self-pay | Admitting: Occupational Therapy

## 2020-02-24 DIAGNOSIS — R278 Other lack of coordination: Secondary | ICD-10-CM

## 2020-02-24 DIAGNOSIS — R2681 Unsteadiness on feet: Secondary | ICD-10-CM

## 2020-02-24 DIAGNOSIS — M6281 Muscle weakness (generalized): Secondary | ICD-10-CM

## 2020-02-24 NOTE — Therapy (Signed)
[place] MAIN [facility] SERVICES [address] [place], [place], [address] Phone: [phone]   Fax:  [phone]  Occupational Therapy Treatment  Patient Details  Name: Carlos Nolan MRN: [id number] Date of Birth: [birth year] No data recorded  Encounter Date: 02/24/2020   OT End of Session - 02/24/20 1301    Visit Number 9    Number of Visits 24    Date for OT Re-Evaluation 04/10/20    OT Start Time 1145    OT Stop Time 1230    OT Time Calculation (min) 45 min    Activity Tolerance Patient tolerated treatment well    Behavior During Therapy [facility] for tasks assessed/performed           Past Medical History:  Diagnosis Date   Atrial fibrillation [facility])    COVID-19 01/2019   Sept 2020    Past Surgical History:  Procedure Laterality Date   APPENDECTOMY     CARDIAC ELECTROPHYSIOLOGY STUDY AND ABLATION  04/24/2015   ELBOW FRACTURE SURGERY     ESOPHAGOGASTRODUODENOSCOPY N/A 01/11/2016   Procedure: ESOPHAGOGASTRODUODENOSCOPY (EGD);  Surgeon: Mauri Pole, MD;  Location: [facility];  Service: Endoscopy;  Laterality: N/A;   FEMUR FRACTURE SURGERY     HARDWARE REMOVAL Left 08/22/2019   Procedure: HARDWARE REMOVAL FROM LEFT ELBOW;  Surgeon: Corky Mull, MD;  Location: [facility];  Service: Orthopedics;  Laterality: Left;   LOOP RECORDER INSERTION N/A 01/21/2020   Procedure: LOOP RECORDER INSERTION;  Surgeon: Isaias Cowman, MD;  Location: [place] [facility];  Service: Cardiovascular;  Laterality: N/A;   SHOULDER ARTHROSCOPY Right 06/15/2016   Procedure: Arthroscopic labral debridement, arthroscopic subacromial decompression, and mini-open bursectomy with exploration of rotator cuff, right shoulder;  Surgeon: Corky Mull, MD;  Location: [place];  Service: Orthopedics;  Laterality: Right;    There were no vitals filed for this visit.   Subjective Assessment - 02/24/20 1301    Subjective  Pt. has returned from  his vacation to [employer].    Patient is accompanied by: Family member    Patient Stated Goals Pt reports he would like to be able to golf again, be as independent as possible.    Currently in Pain? No/denies          OT TREATMENT    Neuro muscular re-education:  Pt. worked on left [facility] skills using the Building services engineer Task. Pt. worked on sustaining grasp on the resistive tweezers while grasping this sticks, and moving them from a horizontal position to a vertical position to prepare for placing them into the pegboard. Pt. required verbal cues, and cues for visual demonstration for wrist position, and hand pattern when placing them into the pegboard. The task was modified to a vertical position when removing the sticks with the tweezers to work on sustained shoulder stability in elevation with increased wrist extension. Pt. worked on grasping, flipping, and turning [place] style discs. Pt. required visual demonstration, and cues for movement patterns. Pt. worked on speed with coordination. Pt. performed additional reps of [facility] skills flipping discs with his vision occluded. Pt. worked on [facility] skills grasping 1/2" washers from a magnetic dish positioned to the left, and slightly to the rear. Pt. worked on reaching up, and placing the washers onto a horizontal dowel positioned overhead.  Therapeutic Exercise:  Pt. performed left gross gripping with a gross grip strengthener. Pt. worked on sustaining grip while grasping pegs and reaching at various heights. The Gripper  was set at 20.9# of resistive force. Pt. Was able to sustain his left hand grip while reaching up to place pegs into a container using the gross gripper.  Pt. reports that he did a lot of walking while at [employer]. Pt. reports that it is still difficult for him to take his wallet out of his back pocket efficiently with his left hand. Pt. dropped several washers from his hand when manipulating small objects with his LUE  sustained in elevation. Pt. Was able to to manipulate tweezers good control when removing the thin sticks while the board is positioned at a vertical angle. Pt. Continues to work on improving left [facility] skills, speed, and dexterity while at the tabletop, as well as when challenged and performed in various contexts, and positions.                       OT Education - 02/24/20 1301    Education Details [place], speed, dexterity    Person(s) Educated Patient    Methods Demonstration;Explanation    Comprehension Verbalized understanding;Returned demonstration               OT Long Term Goals - 01/17/20 2024      OT LONG TERM GOAL #1   Title Pt will be independent with home exercise program for strength, ROM and coordination    Baseline will need modifications to current program from home health    Time 12    Status New    Target Date 04/10/20      OT LONG TERM GOAL #2   Title Pt to improve grip by 10# to be able to hold objects without dropping    Baseline left grip 47# at eval    Time 12    Period Weeks    Status New    Target Date 04/10/20      OT LONG TERM GOAL #3   Title Pt will complete cutting and chopping food for meal prep and self feeding with modified independence    Baseline difficulty with cutting food at eval    Time 12    Period Weeks    Status New    Target Date 04/10/20      OT LONG TERM GOAL #4   Title Pt will improve FOTO score by 10 points to show a clinically relevant change in ability to perform necessary self care and IADL tasks with greater ease during ADL tasks.    Baseline eval FOTO 55    Time 12    Period Weeks    Status New    Target Date 04/10/20      OT LONG TERM GOAL #5   Title Pt will demonstrate use of keyboard with bilateral UEs with good speed and minimal errors.    Baseline difficulty with use of left hand with typing.    Time 12    Period Weeks    Status New    Target Date 04/10/20      Long Term Additional Goals    Additional Long Term Goals Yes      OT LONG TERM GOAL #6   Title Pt will demonstrate secure grip on golf club during all phases of swing.    Baseline eval difficulty with gripping patterns    Time 12    Period Weeks    Status New    Target Date 04/10/20      OT LONG TERM GOAL #7   Title Patient  will demonstrate improved grip to squeeze out toothpaste with left hand with modified independence.    Baseline eval difficulty    Time 6    Period Weeks    Status New    Target Date 02/28/20                 Plan - 02/24/20 1302    Clinical Impression Statement Pt. reports that he did a lot of walking while at [employer]. Pt. reports that it is still difficult for him to take his wallet out of his back pocket efficiently with his left hand. Pt. dropped several washers from his hand when manipulating small objects with his LUE sustained in elevation. Pt. Was able to to manipulate tweezers good control when removing the thin sticks while the board is positioned at a vertical angle. Pt. Continues to work on improving left [facility] skills, speed, and dexterity while at the tabletop, as well as when challenged and performed in various contexts, and positions.   OT Occupational Profile and History Detailed Assessment- Review of Records and additional review of physical, cognitive, psychosocial history related to current functional performance    Occupational performance deficits (Please refer to evaluation for details): ADL's;Work;IADL's;Leisure;Social Participation    Body Structure / Function / Physical Skills ADL;Dexterity;Flexibility;ROM;Strength;Balance;Coordination;FMC;IADL;UE functional use;GMC    Psychosocial Skills Environmental  Adaptations;Routines and Behaviors;Habits    Rehab Potential Excellent    Clinical Decision Making Limited treatment options, no task modification necessary    Comorbidities Affecting Occupational Performance: May have comorbidities impacting occupational  performance    Modification or Assistance to Complete Evaluation  No modification of tasks or assist necessary to complete eval    OT Frequency 2x / week    OT Duration 12 weeks    OT Treatment/Interventions Self-care/ADL training;Cryotherapy;Therapeutic exercise;DME and/or AE instruction;Balance training;Neuromuscular education;Manual Therapy;Moist Heat;Contrast Bath;Therapeutic activities;Patient/family education    Consulted and Agree with Plan of Care Patient           Patient will benefit from skilled therapeutic intervention in order to improve the following deficits and impairments:   Body Structure / Function / Physical Skills: ADL, Dexterity, Flexibility, ROM, Strength, Balance, Coordination, FMC, IADL, UE functional use, GMC   Psychosocial Skills: Environmental  Adaptations, Routines and Behaviors, Habits   Visit Diagnosis: Muscle weakness (generalized)  Other lack of coordination    Problem List Patient Active Problem List   Diagnosis Date Noted   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

## 2020-02-24 NOTE — Therapy (Signed)
[place] MAIN [facility] SERVICES [address] [place], [place], [address] Phone: [phone]   Fax:  [phone]  Physical Therapy Treatment  Patient Details  Name: Carlos Nolan MRN: [id number] Date of Birth: 05-05-1966 Referring Provider (PT): Anabel Bene   Encounter Date: 02/24/2020   PT End of Session - 02/24/20 1104    Visit Number 9    Number of Visits 17    Date for PT Re-Evaluation 03/13/20    PT Start Time 8250    PT Stop Time 1145    PT Time Calculation (min) 40 min    Equipment Utilized During Treatment Gait belt    Activity Tolerance Patient tolerated treatment well;No increased pain    Behavior During Therapy WFL for tasks assessed/performed           Past Medical History:  Diagnosis Date  . Atrial fibrillation ([place])   . COVID-19 01/2019   Sept 2020    Past Surgical History:  Procedure Laterality Date  . APPENDECTOMY    . CARDIAC ELECTROPHYSIOLOGY STUDY AND ABLATION  04/24/2015  . ELBOW FRACTURE SURGERY    . ESOPHAGOGASTRODUODENOSCOPY N/A 01/11/2016   Procedure: ESOPHAGOGASTRODUODENOSCOPY (EGD);  Surgeon: Mauri Pole, MD;  Location: [facility];  Service: Endoscopy;  Laterality: N/A;  . FEMUR FRACTURE SURGERY    . HARDWARE REMOVAL Left 08/22/2019   Procedure: HARDWARE REMOVAL FROM LEFT ELBOW;  Surgeon: Corky Mull, MD;  Location: [facility];  Service: Orthopedics;  Laterality: Left;  . LOOP RECORDER INSERTION N/A 01/21/2020   Procedure: LOOP RECORDER INSERTION;  Surgeon: Isaias Cowman, MD;  Location: [place] [facility];  Service: Cardiovascular;  Laterality: N/A;  . SHOULDER ARTHROSCOPY Right 06/15/2016   Procedure: Arthroscopic labral debridement, arthroscopic subacromial decompression, and mini-open bursectomy with exploration of rotator cuff, right shoulder;  Surgeon: Corky Mull, MD;  Location: [place];  Service: Orthopedics;  Laterality: Right;    There were no vitals filed for  this visit.   Subjective Assessment - 02/24/20 1236    Subjective Pt doing well in general. He denies any flank pain upon arrival today. He went to [employer] . Pt has been working on balance with Wii at home. He is headed to [employer] next week.Pt doing well in general, denies any pain upon arrival. He went to [place] last week and reports that it went well but he did struggle with his endurance with all the walking. No specific questions or concerns at this time.    Pertinent History Patient presents to physical therapy unaccompanied and without AD.  Patient notes L sided weakness and sensory loss secondary to CVA 3 weeks ago. PMH includes atrial fibrillation with successful cardiac ablation in 2017, COVID-19 01/2019, anxiety, s/p ORIF L elbow 07/2019 with subsequent hardware removal 08/2019, appendectomy, esophagogastroduodenoscopy, self-reported history of migraines. Pt presented to [facility] ED 12/24/19 with c/o headache and dizziness. Presented ED again on 12/25/19 with same symptoms with additional numbness of L hemibody. MRI revealed multiple lacunar infarcts near R thalamus and R posterior limb and R external capsule.    Limitations Walking;Standing    How long can you sit comfortably? n/a    How long can you stand comfortably? "Not very long"    How long can you walk comfortably? One block    Patient Stated Goals To play golf again    Currently in Pain? No/denies              TREATMENT   Ther-ex  NuStep L3 x 4 minutes for warm-up during history (2 minutes unbilled); Precor single leg press 55# 2 x 15 each; Sit to stand without UE support from regular height chair x 10, added 5kg overhead med ball press x 10; Matrix diagonal chops starting overhead and down toward floor 22.5#, 2 x 10 each direction; Matrix Pallof press 7.5# 2 x 10 each direction; Matrix resisted walk-outs forward, backward, R lateral, and L lateral 17.5# x 2 each; BOSU (round side up) lunges with twist, no UE support x 5 on each  side;   Pt educated throughout session about proper posture and technique with exercises. Improved exercise technique, movement at target joints, use of target muscles after min to mod verbal, visual, tactile cues.    Patient demonstrates excellent motivation during session today. He is able to complete all exercises as instructed during session.  Notable increase in left lower extremity fatigue and weakness during single-leg press as well as sit to stands.  Patient is a golfer so incorporated some exercises working on oblique strengthening in addition to hip strengthening.  Patient encouraged to continue HEP and follow-up scheduled. Pt will benefit from PT services to address deficits in strength, balance, and mobility in order to return to full function at home.                        PT Short Term Goals - 01/17/20 1054      PT SHORT TERM GOAL #1   Title Patient will be independent in home exercise program to improve strength/mobility for better functional independence with ADLs.    Baseline HEP administered and performed at evaluation    Time 4    Period Weeks    Target Date 02/14/20      PT SHORT TERM GOAL #2   Title Patient will demonstrate 4/5 strength with all left lower extremity movements in order to maximize functional independence.    Baseline 9/10: see note    Time 4    Period Weeks    Target Date 02/14/20             PT Long Term Goals - 01/17/20 1058      PT LONG TERM GOAL #1   Title Patient will increase FOTO score to equal to or greater than 70% to demonstrate statistically significant improvement in mobility and quality of life.    Baseline 9/10 51%    Time 8    Period Weeks    Status New    Target Date 03/13/20      PT LONG TERM GOAL #2   Title Patient (< 64 years old) will complete five times sit to stand test in < 15 seconds with equal weight shift indicating an increased LE strength and improved balance.    Baseline 9/10 21 seconds     Time 8    Period Weeks    Status New    Target Date 03/13/20      PT LONG TERM GOAL #3   Title Patient will tolerate 5 seconds of single leg stance without loss of balance to improve ability to get in and out of shower safely.    Baseline 9/10 Unable to perform    Time 8    Period Weeks    Status New    Target Date 03/13/20      PT LONG TERM GOAL #4   Title Patient will increase 10 meter walk test to >1.40m/s with midline awareness  as to improve gait speed for better community ambulation and to reduce fall risk.    Baseline 9/10 0.71m/s with R lean 1-2 inches    Time 8    Period Weeks    Status New    Target Date 03/13/20      PT LONG TERM GOAL #5   Title Patient will demonstrate an improved Berg Balance Score of >45 as to demonstrate improved stability with ADLs such as sitting/standing and transfer balance and reduced fall risk.    Baseline 9/10: 39/56    Time 8    Period Weeks    Status New    Target Date 03/13/20                 Plan - 02/24/20 1105    Clinical Impression Statement Patient demonstrates excellent motivation during session today. He is able to complete all exercises as instructed during session.  Notable increase in left lower extremity fatigue and weakness during single-leg press as well as sit to stands.  Patient is a golfer so incorporated some exercises working on oblique strengthening in addition to hip strengthening.  Patient encouraged to continue HEP and follow-up scheduled. Pt will benefit from PT services to address deficits in strength, balance, and mobility in order to return to full function at home.    Personal Factors and Comorbidities Past/Current Experience;Comorbidity 2;Time since onset of injury/illness/exacerbation    Comorbidities Atrial fibrillation, anxiety    Examination-Activity Limitations Squat;Bend;Carry;Reach Overhead;Locomotion Level;Lift;Stairs;Stand;Transfers    Examination-Participation Restrictions Community Activity     Stability/Clinical Decision Making Evolving/Moderate complexity    Rehab Potential Good    PT Frequency 2x / week    PT Duration 8 weeks    PT Treatment/Interventions Patient/family education;Therapeutic exercise;Functional mobility training;Gait training;Therapeutic activities;Balance training;Neuromuscular re-education;Manual techniques;ADLs/Self Care Home Management;Aquatic Therapy;Canalith Repostioning;Cryotherapy;Moist Heat;DME Instruction;Stair training;Cognitive remediation;Passive range of motion;Energy conservation;Taping;Vestibular;Visual/perceptual remediation/compensation    PT Next Visit Plan Update outcome measures, goals, and progress note; balance/gait training, LE strengthening    PT Home Exercise Plan see above    Consulted and Agree with Plan of Care Patient           Patient will benefit from skilled therapeutic intervention in order to improve the following deficits and impairments:  Abnormal gait, Decreased balance, Difficulty walking, Improper body mechanics, Impaired sensation, Decreased strength, Postural dysfunction, Decreased coordination, Decreased mobility, Cardiopulmonary status limiting activity, Decreased activity tolerance, Decreased endurance, Impaired flexibility, Impaired UE functional use  Visit Diagnosis: Muscle weakness (generalized)  Unsteadiness on feet     Problem List Patient Active Problem List   Diagnosis Date Noted  . 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 Acute thalamic infarction ([place]) 12/25/2019  . S/P ablation of atrial fibrillation 12/25/2019  . Food impaction of esophagus     Phillips Grout PT, DPT, GCS  Shawntel Farnworth 02/24/2020, 12:42 PM  [place] MAIN [facility] SERVICES [address] [place], [place], [address] Phone: [phone]   Fax:  [phone]  Name: Carlos Nolan MRN: [id number] Date of Birth: Nov 06, 1965

## 2020-02-26 ENCOUNTER — Ambulatory Visit: Payer: 59

## 2020-02-26 ENCOUNTER — Encounter: Payer: Self-pay | Admitting: Occupational Therapy

## 2020-02-26 ENCOUNTER — Ambulatory Visit: Payer: 59 | Admitting: Occupational Therapy

## 2020-02-26 ENCOUNTER — Other Ambulatory Visit: Payer: Self-pay

## 2020-02-26 DIAGNOSIS — M6281 Muscle weakness (generalized): Secondary | ICD-10-CM

## 2020-02-26 DIAGNOSIS — R2681 Unsteadiness on feet: Secondary | ICD-10-CM

## 2020-02-26 DIAGNOSIS — R278 Other lack of coordination: Secondary | ICD-10-CM

## 2020-02-26 NOTE — Therapy (Signed)
[place] MAIN [facility] SERVICES [address] [place], [place], [address] Phone: [phone]   Fax:  [phone]  Physical Therapy Progress Note/Treatment/Discharge   Dates of reporting period  01/17/20   to   02/26/20  Patient Details  Name: Carlos Nolan MRN: [id number] Date of Birth: Jul 20, 1965 Referring Provider (PT): Anabel Bene   Encounter Date: 02/26/2020   PT End of Session - 02/26/20 1103    Visit Number 10    Number of Visits 17    Date for PT Re-Evaluation 03/13/20    PT Start Time 1100    PT Stop Time 1145    PT Time Calculation (min) 45 min    Equipment Utilized During Treatment Gait belt    Activity Tolerance Patient tolerated treatment well;No increased pain    Behavior During Therapy [facility] for tasks assessed/performed           Past Medical History:  Diagnosis Date   Atrial fibrillation [facility])    COVID-19 01/2019   Sept 2020    Past Surgical History:  Procedure Laterality Date   APPENDECTOMY     CARDIAC ELECTROPHYSIOLOGY STUDY AND ABLATION  04/24/2015   ELBOW FRACTURE SURGERY     ESOPHAGOGASTRODUODENOSCOPY N/A 01/11/2016   Procedure: ESOPHAGOGASTRODUODENOSCOPY (EGD);  Surgeon: Mauri Pole, MD;  Location: [facility];  Service: Endoscopy;  Laterality: N/A;   FEMUR FRACTURE SURGERY     HARDWARE REMOVAL Left 08/22/2019   Procedure: HARDWARE REMOVAL FROM LEFT ELBOW;  Surgeon: Corky Mull, MD;  Location: [facility];  Service: Orthopedics;  Laterality: Left;   LOOP RECORDER INSERTION N/A 01/21/2020   Procedure: LOOP RECORDER INSERTION;  Surgeon: Isaias Cowman, MD;  Location: [place] [facility];  Service: Cardiovascular;  Laterality: N/A;   SHOULDER ARTHROSCOPY Right 06/15/2016   Procedure: Arthroscopic labral debridement, arthroscopic subacromial decompression, and mini-open bursectomy with exploration of rotator cuff, right shoulder;  Surgeon: Corky Mull, MD;  Location: [facility];   Service: Orthopedics;  Laterality: Right;    There were no vitals filed for this visit.   Subjective Assessment - 02/26/20 1057    Subjective Pt doing well in general. His L knee buckled on him Monday afternoon and pt states that he almost fell on the stairs. He reports 2/10 flank pain upon arrival today. No specific questions or concerns at this time.    Pertinent History Patient presents to physical therapy unaccompanied and without AD.  Patient notes L sided weakness and sensory loss secondary to CVA 3 weeks ago. PMH includes atrial fibrillation with successful cardiac ablation in 2017, COVID-19 01/2019, anxiety, s/p ORIF L elbow 07/2019 with subsequent hardware removal 08/2019, appendectomy, esophagogastroduodenoscopy, self-reported history of migraines. Pt presented to [facility] ED 12/24/19 with c/o headache and dizziness. Presented ED again on 12/25/19 with same symptoms with additional numbness of L hemibody. MRI revealed multiple lacunar infarcts near R thalamus and R posterior limb and R external capsule.    Limitations Walking;Standing    How long can you sit comfortably? n/a    How long can you stand comfortably? "Not very long"    How long can you walk comfortably? One block    Patient Stated Goals To play golf again    Currently in Pain? Yes    Pain Score 2     Pain Location Flank    Pain Orientation Left    Pain Descriptors / Indicators Aching    Pain Type Chronic pain    Pain  Onset 1 to 4 weeks ago    Pain Frequency Intermittent              [facility] PT Assessment - 02/26/20 1116      Berg Balance Test   Sit to Stand Able to stand without using hands and stabilize independently    Standing Unsupported Able to stand safely 2 minutes    Sitting with Back Unsupported but Feet Supported on Floor or Stool Able to sit safely and securely 2 minutes    Stand to Sit Sits safely with minimal use of hands    Transfers Able to transfer safely, minor use of hands    Standing Unsupported with  Eyes Closed Able to stand 10 seconds safely    Standing Unsupported with Feet Together Able to place feet together independently and stand 1 minute safely    From Standing, Reach Forward with Outstretched Arm Can reach confidently >25 cm (10")    From Standing Position, Pick up Object from Floor Able to pick up shoe safely and easily    From Standing Position, Turn to Look Behind Over each Shoulder Looks behind from both sides and weight shifts well    Turn 360 Degrees Able to turn 360 degrees safely in 4 seconds or less    Standing Unsupported, Alternately Place Feet on Step/Stool Able to stand independently and safely and complete 8 steps in 20 seconds    Standing Unsupported, One Foot in Front Able to place foot tandem independently and hold 30 seconds    Standing on One Leg Able to lift leg independently and hold > 10 seconds    Total Score 56      Functional Gait  Assessment   Gait assessed  Yes    Gait Level Surface Walks 20 ft in less than 5.5 sec, no assistive devices, good speed, no evidence for imbalance, normal gait pattern, deviates no more than 6 in outside of the 12 in walkway width.    Change in Gait Speed Able to smoothly change walking speed without loss of balance or gait deviation. Deviate no more than 6 in outside of the 12 in walkway width.    Gait with Horizontal Head Turns Performs head turns smoothly with no change in gait. Deviates no more than 6 in outside 12 in walkway width    Gait with Vertical Head Turns Performs head turns with no change in gait. Deviates no more than 6 in outside 12 in walkway width.    Gait and Pivot Turn Pivot turns safely within 3 sec and stops quickly with no loss of balance.    Step Over Obstacle Is able to step over 2 stacked shoe boxes taped together (9 in total height) without changing gait speed. No evidence of imbalance.    Gait with Narrow Base of Support Is able to ambulate for 10 steps heel to toe with no staggering.    Gait with Eyes  Closed Walks 20 ft, no assistive devices, good speed, no evidence of imbalance, normal gait pattern, deviates no more than 6 in outside 12 in walkway width. Ambulates 20 ft in less than 7 sec.    Ambulating Backwards Walks 20 ft, no assistive devices, good speed, no evidence for imbalance, normal gait    Steps Alternating feet, no rail.    Total Score 30              TREATMENT   Neuromuscular Re-education  NuStep L1 x 3 minutes for warm-up during  history; Updated outcome measures with patient (see below);    OUTCOME MEASURES: TEST 01/17/20 02/26/20 Interpretation  5 times sit<>stand 21 sec no UE support 11.8s >60 yo, >15 sec indicates increased risk for falls  10 meter walk test          0.56       M/s no AD Self-selected: 9.3s = 1.08, fastest: 7.0s = 1.43 m/s without assistive device  <1.0 m/s indicates increased risk for falls; limited community ambulator  [employer] Assessment 39/56 56/56 <36/56 (100% risk for falls), 37-45 (80% risk for falls); 46-51 (>50% risk for falls); 52-55 (lower risk <25% of falls)  FOTO 51 67 Predicted improvement to 70  MiniBEST NT 25/28   FGA NT 30/30    Single leg balance: >30s on RLE, 15s on LLE;    STRENGTH:  Graded on a 0-5 scale Muscle Group Left Right  Hip Flex 4+/5 5/5  Hip Abd 4/5 4+/5  Hip Add 4+/5 4+/5  Knee Flex 4+/5 5/5  Knee Ext 4+/5 5/5  Ankle DF 4+/5 5/5  Ankle PF 4+/5 5/5     Patient demonstrates excellent motivation during session today.  His outcome measures have all improved dramatically since his initial evaluation.  His 5 times sit to stand dropped from 21 seconds at initial evaluation to 11.8 seconds today.  His 10 m walk speed is now greater than 1 m/s and his BERG balance assessment improved from 39/56 to 56/56.  His FOTO improved from 51 to 67.  Performed miniBEST and FGA with patient today and he scored 25/28 and 30/30 respectively.  His left lower extremity strength has improved significantly as well and is  within a half grade manual muscle test to his right side.  He met all the short-term goals and 4 of his 5 long-term goals and is ready to discharge on this date.  Patient agrees that he is ready to discharge and he was advised to follow-up as needed if he has any further difficulty.                   PT Education - 02/26/20 1151    Education Details Discharge    Person(s) Educated Patient    Methods Explanation    Comprehension Verbalized understanding            PT Short Term Goals - 02/26/20 1132      PT SHORT TERM GOAL #1   Title Patient will be independent in home exercise program to improve strength/mobility for better functional independence with ADLs.    Baseline HEP administered and performed at evaluation; 02/26/20: Performing consistently    Time 4    Period Weeks    Status Achieved      PT SHORT TERM GOAL #2   Title Patient will demonstrate 4/5 strength with all left lower extremity movements in order to maximize functional independence.    Baseline 9/10: see note; 02/26/20: see note    Time 4    Period Weeks    Status Achieved    Target Date --             PT Long Term Goals - 02/26/20 1132      PT LONG TERM GOAL #1   Title Patient will increase FOTO score to equal to or greater than 70% to demonstrate statistically significant improvement in mobility and quality of life.    Baseline 9/10 51; 02/26/20: 67    Time 8    Period Weeks  Status Partially Met    Target Date 03/13/20      PT LONG TERM GOAL #2   Title Patient (< 56 years old) will complete five times sit to stand test in < 15 seconds with equal weight shift indicating an increased LE strength and improved balance.    Baseline 9/10 21 seconds; 02/26/20: 11.8s    Time 8    Period Weeks    Status Achieved      PT LONG TERM GOAL #3   Title Patient will tolerate 5 seconds of single leg stance without loss of balance to improve ability to get in and out of shower safely.    Baseline  9/10 Unable to perform; 02/26/20: >30s RLE, 15s LLE    Time 8    Period Weeks    Status Achieved      PT LONG TERM GOAL #4   Title Patient will increase 10 meter walk test to >1.42ms with midline awareness as to improve gait speed for better community ambulation and to reduce fall risk.    Baseline 9/10 0.561m with R lean 1-2 inches; 02/26/20: Self-selected: 9.3s = 1.08, fastest: 7.0s = 1.43 m/s without assistive device    Time 8    Period Weeks    Status Achieved      PT LONG TERM GOAL #5   Title Patient will demonstrate an improved Berg Balance Score of >45 as to demonstrate improved stability with ADLs such as sitting/standing and transfer balance and reduced fall risk.    Baseline 9/10: 39/56; 02/26/20: 56/56    Time 8    Period Weeks    Status Achieved                 Plan - 02/26/20 1104    Clinical Impression Statement Patient demonstrates excellent motivation during session today.  His outcome measures have all improved dramatically since his initial evaluation.  His 5 times sit to stand dropped from 21 seconds at initial evaluation to 11.8 seconds today.  His 10 m walk speed is now greater than 1 m/s and his BERG balance assessment improved from 39/56 to 56/56.  His FOTO improved from 51 to 67.  Performed miniBEST and FGA with patient today and he scored 25/28 and 30/30 respectively.  His left lower extremity strength has improved significantly as well and is within a half grade manual muscle test to his right side.  He met all the short-term goals and 4 of his 5 long-term goals and is ready to discharge on this date.  Patient agrees that he is ready to discharge and he was advised to follow-up as needed if he has any further difficulty.    Personal Factors and Comorbidities Past/Current Experience;Comorbidity 2;Time since onset of injury/illness/exacerbation    Comorbidities Atrial fibrillation, anxiety    Examination-Activity Limitations Squat;Bend;Carry;Reach  Overhead;Locomotion Level;Lift;Stairs;Stand;Transfers    Examination-Participation Restrictions Community Activity    Stability/Clinical Decision Making Evolving/Moderate complexity    Rehab Potential Good    PT Frequency 2x / week    PT Duration 8 weeks    PT Treatment/Interventions Patient/family education;Therapeutic exercise;Functional mobility training;Gait training;Therapeutic activities;Balance training;Neuromuscular re-education;Manual techniques;ADLs/Self Care Home Management;Aquatic Therapy;Canalith Repostioning;Cryotherapy;Moist Heat;DME Instruction;Stair training;Cognitive remediation;Passive range of motion;Energy conservation;Taping;Vestibular;Visual/perceptual remediation/compensation    PT Next Visit Plan Discharge    PT Home Exercise Plan see above    Consulted and Agree with Plan of Care Patient           Patient will benefit from skilled therapeutic intervention in  order to improve the following deficits and impairments:  Abnormal gait, Decreased balance, Difficulty walking, Improper body mechanics, Impaired sensation, Decreased strength, Postural dysfunction, Decreased coordination, Decreased mobility, Cardiopulmonary status limiting activity, Decreased activity tolerance, Decreased endurance, Impaired flexibility, Impaired UE functional use  Visit Diagnosis: Muscle weakness (generalized)  Unsteadiness on feet     Problem List Patient Active Problem List   Diagnosis Date Noted   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 [place], [place], [address] Phone: [phone]   Fax:  [phone]  Name: Carlos Nolan MRN: [id number] Date of Birth: [birth year]

## 2020-02-26 NOTE — Therapy (Signed)
[facility] [address] [place], [place], [address] Phone: [phone]   Fax:  [phone]  Occupational Therapy Treatment/Recertification/Occupational Therapy Progress Note  Dates of reporting period  01/17/2020 to   02/25/2021  Patient Details  Name: Carlos Nolan MRN: [id number] Date of Birth: 11/28/1965 No data recorded  Encounter Date: 02/26/2020   OT End of Session - 02/26/20 1152    Visit Number 10    Number of Visits 24    Date for OT Re-Evaluation 05/20/20    OT Start Time 1147    OT Stop Time 1230    OT Time Calculation (min) 43 min    Activity Tolerance Patient tolerated treatment well    Behavior During Therapy WFL for tasks assessed/performed           Past Medical History:  Diagnosis Date  . Atrial fibrillation (HCC)   . COVID-19 01/2019   Sept 2020    Past Surgical History:  Procedure Laterality Date  . APPENDECTOMY    . CARDIAC ELECTROPHYSIOLOGY STUDY AND ABLATION  04/24/2015  . ELBOW FRACTURE SURGERY    . ESOPHAGOGASTRODUODENOSCOPY N/A 01/11/2016   Procedure: ESOPHAGOGASTRODUODENOSCOPY (EGD);  Surgeon: Kavitha V Nandigam, MD;  Location: [facility];  Service: Endoscopy;  Laterality: N/A;  . FEMUR FRACTURE SURGERY    . HARDWARE REMOVAL Left 08/22/2019   Procedure: HARDWARE REMOVAL FROM LEFT ELBOW;  Surgeon: Poggi, John J, MD;  Location: [facility];  Service: Orthopedics;  Laterality: Left;  . LOOP RECORDER INSERTION N/A 01/21/2020   Procedure: LOOP RECORDER INSERTION;  Surgeon: Paraschos, Alexander, MD;  Location: [facility];  Service: Cardiovascular;  Laterality: N/A;  . SHOULDER ARTHROSCOPY Right 06/15/2016   Procedure: Arthroscopic labral debridement, arthroscopic subacromial decompression, and mini-open bursectomy with exploration of rotator cuff, right shoulder;  Surgeon: John J Poggi, MD;  Location: [facility];  Service: Orthopedics;  Laterality: Right;    There were no  vitals filed for this visit.   Subjective Assessment - 02/26/20 1151    Subjective  Pt. reports that he discharged from PT today.    Patient is accompanied by: Family member    Patient Stated Goals Pt reports he would like to be able to golf again, be as independent as possible.    Pain Score 3     Pain Location Hand    Pain Orientation Left    Pain Descriptors / Indicators Tingling    Pain Onset 1 to 4 weeks ago              [facility] OT Assessment - 02/26/20 1154      Coordination   Right 9 Hole Peg Test 25    Left 9 Hole Peg Test 27      Strength   Overall Strength Comments LUE strength: shoulder flexion, abduction 4/5, elbow flexion extension, wrist flexion,a nd extension 5/5      Hand Function   Left Hand Grip (lbs) 78    Left Hand Lateral Pinch 23 lbs    Left 3 point pinch 25 lbs    Comment 2pt. pinch: 17          Measurements were obtained, and goals were reviewed, and modified with the pt. Pt. has made excellent progress with left UE functioning, and has improved with LUE strength, grip strength, pinch strength, and FMC skills. Pt. Has met multiple goals, and has improved his FOTO score from 55 at intake to 63. Pt. Continues to work   on improving left hand function, and FMC skills when challenged in various contexts, and when sustained in elevation. Pt. continues to work towards improving his left hand in order to be able to efficiently manipulate utensils fluidly when picking them up, and placing them down, use tools efficiently and safely, use his left hand while performing tasks with his UE sustained in elevation, and improving typing speed for work related tasks.                   OT Education - 02/26/20 1532    Education Details Ther. ex., FMC, speed, dexterity    Person(s) Educated Patient    Methods Demonstration;Explanation    Comprehension Verbalized understanding;Returned demonstration               OT Long Term Goals - 02/26/20 1153       OT LONG TERM GOAL #1   Title Pt will be independent with home exercise program for strength, ROM and coordination    Baseline Independent    Time 12    Period Weeks    Status Achieved      OT LONG TERM GOAL #2   Title Pt to improve grip by 10# to be able to hold objects without dropping    Baseline left grip 78#    Time 12    Period Weeks    Status Achieved      OT LONG TERM GOAL #3   Title Pt will complete cutting and chopping food for meal prep and self feeding with modified independence    Baseline Pt. is able to cut food.    Time 12    Period Weeks    Status Achieved      OT LONG TERM GOAL #4   Title Pt will improve FOTO score by 10 points to show a clinically relevant change in ability to perform necessary self care and IADL tasks with greater ease during ADL tasks.    Baseline FOTO 63    Time 12    Period Weeks    Status On-going    Target Date 05/20/20      OT LONG TERM GOAL #5   Title Pt will demonstrate use of keyboard with bilateral UEs with good speed and minimal errors.    Baseline Typing speed 35 wpm with 92% accuracy.    Time 12    Period Weeks    Status On-going    Target Date 05/21/19      Long Term Additional Goals   Additional Long Term Goals Yes      OT LONG TERM GOAL #6   Title Pt will demonstrate secure grip on golf club during all phases of swing.    Baseline Pt. still has difficulty with sesantion, and  securing a grip.    Time 12    Period Weeks    Status On-going    Target Date 05/20/20      OT LONG TERM GOAL #7   Title Patient will demonstrate improved grip to squeeze out toothpaste with left hand with modified independence.    Baseline Independent    Time 6    Period Weeks    Status Achieved      OT LONG TERM GOAL #8   Title Pt. will independently, and efficiently pick up utensils from a table, move them in his hand to prepare for use, and place them back onto the table while talking, and attending to other stimuli    Baseline   Pt.  has difficulty with the fluidity of the movement.    Time 12    Period Weeks    Status New    Target Date 05/20/20      OT LONG TERM GOAL  #9   TITLE Pt. will independently stabilize, and use hand tools with bilateral hands in multiple positions.    Baseline Pt. has difficulty using handf tools with his left hand                 Plan - 02/26/20 1153    Clinical Impression Statement Measurements were obtained, and goals were reviewed, and modified with the pt. Pt. has made excellent progress with left UE functioning, and has improved with LUE strength, grip strength, pinch strength, and FMC skills. Pt. Has met multiple goals, and has improved his FOTO score from 55 at intake to 63. Pt. Continues to work on improving left hand function, and FMC skills when challenged in various contexts, and when sustained in elevation. Pt. continues to work towards improving his left hand in order to be able to efficiently manipulate utensils fluidly when picking them up, and placing them down, use tools efficiently and safely, use his left hand while performing tasks with his UE sustained in elevation, and improving typing speed for work related tasks.     OT Occupational Profile and History Detailed Assessment- Review of Records and additional review of physical, cognitive, psychosocial history related to current functional performance    Occupational performance deficits (Please refer to evaluation for details): ADL's;Work;IADL's;Leisure;Social Participation    Body Structure / Function / Physical Skills ADL;Dexterity;Flexibility;ROM;Strength;Balance;Coordination;FMC;IADL;UE functional use;GMC    Rehab Potential Excellent    Clinical Decision Making Limited treatment options, no task modification necessary    Comorbidities Affecting Occupational Performance: May have comorbidities impacting occupational performance    Modification or Assistance to Complete Evaluation  No modification of tasks or assist  necessary to complete eval    OT Frequency 2x / week    OT Duration 12 weeks    OT Treatment/Interventions Self-care/ADL training;Cryotherapy;Therapeutic exercise;DME and/or AE instruction;Balance training;Neuromuscular education;Manual Therapy;Moist Heat;Contrast Bath;Therapeutic activities;Patient/family education    Consulted and Agree with Plan of Care Patient           Patient will benefit from skilled therapeutic intervention in order to improve the following deficits and impairments:   Body Structure / Function / Physical Skills: ADL, Dexterity, Flexibility, ROM, Strength, Balance, Coordination, FMC, IADL, UE functional use, GMC       Visit Diagnosis: Muscle weakness (generalized)  Other lack of coordination    Problem List Patient Active Problem List   Diagnosis Date Noted  . 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 Acute thalamic infarction (HCC) 12/25/2019  . S/P ablation of atrial fibrillation 12/25/2019  . Food impaction of esophagus     Elaine Jagentenfl, MS, OTR/L 02/26/2020, 3:35 PM  [facility] [address] [place], [place], [address] Phone: [phone]   Fax:  [phone]  Name: Greyden J Schwake MRN: [id number] Date of Birth: 04/14/1966 

## 2020-03-02 ENCOUNTER — Other Ambulatory Visit: Payer: Self-pay

## 2020-03-02 ENCOUNTER — Encounter: Payer: Self-pay | Admitting: Occupational Therapy

## 2020-03-02 ENCOUNTER — Ambulatory Visit: Payer: 59

## 2020-03-02 ENCOUNTER — Ambulatory Visit: Payer: 59 | Admitting: Occupational Therapy

## 2020-03-02 DIAGNOSIS — M6281 Muscle weakness (generalized): Secondary | ICD-10-CM | POA: Diagnosis not present

## 2020-03-02 DIAGNOSIS — R278 Other lack of coordination: Secondary | ICD-10-CM

## 2020-03-02 NOTE — Therapy (Signed)
[place] MAIN [facility] SERVICES [address] [place], [place], [address] Phone: [phone]   Fax:  [phone]  Occupational Therapy Treatment  Patient Details  Name: Carlos Nolan MRN: [id number] Date of Birth: [birth year] No data recorded  Encounter Date: 03/02/2020   OT End of Session - 03/02/20 1518    Visit Number 11    Number of Visits 24    Date for OT Re-Evaluation 05/20/20    OT Start Time 1146    OT Stop Time 1236    OT Time Calculation (min) 50 min    Activity Tolerance Patient tolerated treatment well    Behavior During Therapy [facility] for tasks assessed/performed           Past Medical History:  Diagnosis Date  . Atrial fibrillation ([place])   . COVID-19 01/2019   Sept 2020    Past Surgical History:  Procedure Laterality Date  . APPENDECTOMY    . CARDIAC ELECTROPHYSIOLOGY STUDY AND ABLATION  04/24/2015  . ELBOW FRACTURE SURGERY    . ESOPHAGOGASTRODUODENOSCOPY N/A 01/11/2016   Procedure: ESOPHAGOGASTRODUODENOSCOPY (EGD);  Surgeon: Mauri Pole, MD;  Location: [facility];  Service: Endoscopy;  Laterality: N/A;  . FEMUR FRACTURE SURGERY    . HARDWARE REMOVAL Left 08/22/2019   Procedure: HARDWARE REMOVAL FROM LEFT ELBOW;  Surgeon: Corky Mull, MD;  Location: [facility];  Service: Orthopedics;  Laterality: Left;  . LOOP RECORDER INSERTION N/A 01/21/2020   Procedure: LOOP RECORDER INSERTION;  Surgeon: Isaias Cowman, MD;  Location: [facility];  Service: Cardiovascular;  Laterality: N/A;  . SHOULDER ARTHROSCOPY Right 06/15/2016   Procedure: Arthroscopic labral debridement, arthroscopic subacromial decompression, and mini-open bursectomy with exploration of rotator cuff, right shoulder;  Surgeon: Corky Mull, MD;  Location: [place];  Service: Orthopedics;  Laterality: Right;    There were no vitals filed for this visit.   Subjective Assessment - 03/02/20 1513    Subjective  Pt reports doing  well, denies complaints aside from wanting to get his LUE "back to normal."    Patient Stated Goals Pt reports he would like to be able to golf again, be as independent as possible.    Currently in Pain? No/denies              OT Treatment  Neuro Re-ed  Pt performed [facility] tasks involving 1) scissors on L hand to cut in lines and shapes to increase the challenge presented while using RUE to support the task, 2) card sorting activity requiring modified shuffling, sorting stacks into suites, and picking up individual cards off a table top and flip over which required increased time and effort to perform with several attempts, and 3) in-hand manipulation and finger to/from palm translation with various sized coins and requiring using a pincer grasp to place coins in resistive slot in various elevated positions including crossing midline. Pt required occasional cues for technique and to "re-set" when he became mildly frustrated. Pt provided with modifications to tasks to support carryover with similar activities at home. Pt verbalized understanding.                   OT Education - 03/02/20 1518    Education Details Ther. ex., [place], speed, dexterity    Person(s) Educated Patient    Methods Demonstration;Explanation    Comprehension Verbalized understanding;Returned demonstration               OT Long Term Goals - 02/26/20  [place] #1   Title Pt will be independent with home exercise program for strength, ROM and coordination    Baseline Independent    Time 12    Period Weeks    Status Achieved      OT LONG TERM GOAL #2   Title Pt to improve grip by 10# to be able to hold objects without dropping    Baseline left grip 78#    Time 12    Period Weeks    Status Achieved      OT LONG TERM GOAL #3   Title Pt will complete cutting and chopping food for meal prep and self feeding with modified independence    Baseline Pt. is able to cut food.    Time  12    Period Weeks    Status Achieved      OT LONG TERM GOAL #4   Title Pt will improve FOTO score by 10 points to show a clinically relevant change in ability to perform necessary self care and IADL tasks with greater ease during ADL tasks.    Baseline FOTO 63    Time 12    Period Weeks    Status On-going    Target Date 05/20/20      OT LONG TERM GOAL #5   Title Pt will demonstrate use of keyboard with bilateral UEs with good speed and minimal errors.    Baseline Typing speed 35 wpm with 92% accuracy.    Time 12    Period Weeks    Status On-going    Target Date 05/21/19      Long Term Additional Goals   Additional Long Term Goals Yes      OT LONG TERM GOAL #6   Title Pt will demonstrate secure grip on golf club during all phases of swing.    Baseline Pt. still has difficulty with sesantion, and  securing a grip.    Time 12    Period Weeks    Status On-going    Target Date 05/20/20      OT LONG TERM GOAL #7   Title Patient will demonstrate improved grip to squeeze out toothpaste with left hand with modified independence.    Baseline Independent    Time 6    Period Weeks    Status Achieved      OT LONG TERM GOAL #8   Title Pt. will independently, and efficiently pick up utensils from a table, move them in his hand to prepare for use, and place them back onto the table while talking, and attending to other stimuli    Baseline Pt. has difficulty with the fluidity of the movement.    Time 12    Period Weeks    Status New    Target Date 05/20/20      OT LONG TERM GOAL  #9   TITLE Pt. will independently stabilize, and use hand tools with bilateral hands in multiple positions.    Baseline Pt. has difficulty using handf tools with his left hand                 Plan - 03/02/20 1519    Clinical Impression Statement Pt seen for OT tx this date. Pt eager and engaged throughout. Pt tolerated session well. Sustained elevation of LUE and [place] continues to present  challenges for the pt. His LUE functioning continues to improve, however, continues to benefit from skilled OT services  to maximize LUE functional use, particularly for higher level tasks such as golfing and computer work for his job.    OT Occupational Profile and History Detailed Assessment- Review of Records and additional review of physical, cognitive, psychosocial history related to current functional performance    Occupational performance deficits (Please refer to evaluation for details): ADL's;Work;IADL's;Leisure;Social Participation    Body Structure / Function / Physical Skills ADL;Dexterity;Flexibility;ROM;Strength;Balance;Coordination;FMC;IADL;UE functional use;GMC    Psychosocial Skills Environmental  Adaptations;Routines and Behaviors;Habits    Rehab Potential Excellent    Clinical Decision Making Limited treatment options, no task modification necessary    Comorbidities Affecting Occupational Performance: May have comorbidities impacting occupational performance    Modification or Assistance to Complete Evaluation  No modification of tasks or assist necessary to complete eval    OT Frequency 2x / week    OT Duration 12 weeks    OT Treatment/Interventions Self-care/ADL training;Cryotherapy;Therapeutic exercise;DME and/or AE instruction;Balance training;Neuromuscular education;Manual Therapy;Moist Heat;Contrast Bath;Therapeutic activities;Patient/family education    Plan Continue with current OT plan of care to address pt's goals.    Consulted and Agree with Plan of Care Patient           Patient will benefit from skilled therapeutic intervention in order to improve the following deficits and impairments:   Body Structure / Function / Physical Skills: ADL, Dexterity, Flexibility, ROM, Strength, Balance, Coordination, FMC, IADL, UE functional use, GMC   Psychosocial Skills: Environmental  Adaptations, Routines and Behaviors, Habits   Visit Diagnosis: Other lack of  coordination    Problem List Patient Active Problem List   Diagnosis Date Noted  . Stroke (cerebrum) ([place]) 12/26/2019  . Acute thalamic infarction ([place]) 12/25/2019  . S/P ablation of atrial fibrillation 12/25/2019  . Food impaction of esophagus    Jeni Salles, MPH, MS, OTR/L ascom [phone] 03/02/20, 3:33 PM   [place] MAIN [facility] SERVICES [address] [place], [place], [address] Phone: [phone]   Fax:  [phone]  Name: Carlos Nolan MRN: [id number] Date of Birth: 26-Nov-1965

## 2020-03-03 ENCOUNTER — Encounter: Payer: 59 | Admitting: Occupational Therapy

## 2020-03-03 ENCOUNTER — Ambulatory Visit: Payer: 59

## 2020-03-04 ENCOUNTER — Other Ambulatory Visit: Payer: Self-pay

## 2020-03-04 ENCOUNTER — Encounter: Payer: Self-pay | Admitting: Occupational Therapy

## 2020-03-04 ENCOUNTER — Ambulatory Visit: Payer: 59 | Admitting: Occupational Therapy

## 2020-03-04 ENCOUNTER — Ambulatory Visit: Payer: 59

## 2020-03-04 DIAGNOSIS — M6281 Muscle weakness (generalized): Secondary | ICD-10-CM

## 2020-03-04 DIAGNOSIS — R278 Other lack of coordination: Secondary | ICD-10-CM

## 2020-03-04 NOTE — Therapy (Signed)
[place] [facility] MAIN [facility] SERVICES [address] [place], [place], [address] Phone: [phone]   Fax:  [phone]  Occupational Therapy Treatment  Patient Details  Name: Carlos Nolan MRN: [id number] Date of Birth: Nov 07, 1965 No data recorded  Encounter Date: 03/04/2020   OT End of Session - 03/04/20 1326    Visit Number 12    Number of Visits 24    Date for OT Re-Evaluation 05/20/20    OT Start Time 1145    OT Stop Time 1230    OT Time Calculation (min) 45 min    Activity Tolerance Patient tolerated treatment well    Behavior During Therapy [facility] for tasks assessed/performed           Past Medical History:  Diagnosis Date  . Atrial fibrillation (HCC)   . COVID-19 01/2019   Sept 2020    Past Surgical History:  Procedure Laterality Date  . APPENDECTOMY    . CARDIAC ELECTROPHYSIOLOGY STUDY AND ABLATION  04/24/2015  . ELBOW FRACTURE SURGERY    . ESOPHAGOGASTRODUODENOSCOPY N/A 01/11/2016   Procedure: ESOPHAGOGASTRODUODENOSCOPY (EGD);  Surgeon: Napoleon Form, MD;  Location: [facility];  Service: Endoscopy;  Laterality: N/A;  . FEMUR FRACTURE SURGERY    . HARDWARE REMOVAL Left 08/22/2019   Procedure: HARDWARE REMOVAL FROM LEFT ELBOW;  Surgeon: Christena Flake, MD;  Location: [facility] ORS;  Service: Orthopedics;  Laterality: Left;  . LOOP RECORDER INSERTION N/A 01/21/2020   Procedure: LOOP RECORDER INSERTION;  Surgeon: Marcina Millard, MD;  Location: [facility] INVASIVE CV LAB;  Service: Cardiovascular;  Laterality: N/A;  . SHOULDER ARTHROSCOPY Right 06/15/2016   Procedure: Arthroscopic labral debridement, arthroscopic subacromial decompression, and mini-open bursectomy with exploration of rotator cuff, right shoulder;  Surgeon: Christena Flake, MD;  Location: [facility];  Service: Orthopedics;  Laterality: Right;    There were no vitals filed for this visit.   Subjective Assessment - 03/04/20 1326    Subjective  Pt reports doing  well, denies complaints aside from wanting to get his LUE "back to normal."    Patient is accompanied by: Family member    Patient Stated Goals Pt reports he would like to be able to golf again, be as independent as possible.    Currently in Pain? No/denies           OT TREATMENT    Neuro muscular re-education:  Pt. Worked on left hand function skills removing coins, and 1/2" pegs from green resistive putty. Pt. Worked on grasping coins from a tabletop surface, and reaching up to place them into a resistive container, and pushing them through the slot while isolating his 2nd digit. Pt. worked on grasping 1" grooved pegs and reaching out into abduction to place them onto a board, after moving them through his hand from his palm to the tip of his 2nd digit, and thumb. Therapeutic Exercise. Pt. Worked on UE motor control, and coordination skills while handling, and using tools. Pt. worked on Public librarian, Patent attorney, and screw drivers to remove screws, and bolts of varying sizes.  Pt. Is making progress with left hand function skills, and [facility] skills when the LUE in sustained in elevation, as well as when challenged with distractions. Pt. Was able to to use his left hand to manage tools at a flat tabletop surface. Pt. continues to need work on using tools when is multiple positions  Pt. continues to have difficulty reaching posteriorly to grasp a wallet from his  pocket, and has difficulty with motor control using the ATM while reaching out to the side in abduction to press the buttons. Pt. Continues to work on improving LUE strength, and [facility] skill in order to work towards increasing engagement of the LUE during more ADLs, and IADL tasks.                       OT Education - 03/04/20 1326    Education Details Ther. ex., [place], speed, dexterity    Person(s) Educated Patient    Methods Demonstration;Explanation    Comprehension Verbalized understanding;Returned  demonstration               OT Long Term Goals - 02/26/20 1153      OT LONG TERM GOAL #1   Title Pt will be independent with home exercise program for strength, ROM and coordination    Baseline Independent    Time 12    Period Weeks    Status Achieved      OT LONG TERM GOAL #2   Title Pt to improve grip by 10# to be able to hold objects without dropping    Baseline left grip 78#    Time 12    Period Weeks    Status Achieved      OT LONG TERM GOAL #3   Title Pt will complete cutting and chopping food for meal prep and self feeding with modified independence    Baseline Pt. is able to cut food.    Time 12    Period Weeks    Status Achieved      OT LONG TERM GOAL #4   Title Pt will improve FOTO score by 10 points to show a clinically relevant change in ability to perform necessary self care and IADL tasks with greater ease during ADL tasks.    Baseline FOTO 63    Time 12    Period Weeks    Status On-going    Target Date 05/20/20      OT LONG TERM GOAL #5   Title Pt will demonstrate use of keyboard with bilateral UEs with good speed and minimal errors.    Baseline Typing speed 35 wpm with 92% accuracy.    Time 12    Period Weeks    Status On-going    Target Date 05/21/19      Long Term Additional Goals   Additional Long Term Goals Yes      OT LONG TERM GOAL #6   Title Pt will demonstrate secure grip on golf club during all phases of swing.    Baseline Pt. still has difficulty with sesantion, and  securing a grip.    Time 12    Period Weeks    Status On-going    Target Date 05/20/20      OT LONG TERM GOAL #7   Title Patient will demonstrate improved grip to squeeze out toothpaste with left hand with modified independence.    Baseline Independent    Time 6    Period Weeks    Status Achieved      OT LONG TERM GOAL #8   Title Pt. will independently, and efficiently pick up utensils from a table, move them in his hand to prepare for use, and place them back  onto the table while talking, and attending to other stimuli    Baseline Pt. has difficulty with the fluidity of the movement.    Time 12  Period Weeks    Status New    Target Date 05/20/20      OT LONG TERM GOAL  #9   TITLE Pt. will independently stabilize, and use hand tools with bilateral hands in multiple positions.    Baseline Pt. has difficulty using handf tools with his left hand                 Plan - 03/04/20 1327    Clinical Impression Statement Pt. Is making progress with left hand function skills, and [facility] skills when the LUE in sustained in elevation, as well as when challenged with distractions. Pt. Was able to to use his left hand to manage tools at a flat tabletop surface. Pt. continues to need work on using tools when is multiple positions  Pt. continues to have difficulty reaching posteriorly to grasp a wallet from his pocket, and has difficulty with motor control using the ATM while reaching out to the side in abduction to press the buttons. Pt. Continues to work on improving LUE strength, and [facility] skill in order to work towards increasing engagement of the LUE during more ADLs, and IADL tasks.   OT Occupational Profile and History Detailed Assessment- Review of Records and additional review of physical, cognitive, psychosocial history related to current functional performance    Occupational performance deficits (Please refer to evaluation for details): ADL's;Work;IADL's;Leisure;Social Participation    Body Structure / Function / Physical Skills ADL;Dexterity;Flexibility;ROM;Strength;Balance;Coordination;FMC;IADL;UE functional use;GMC    Rehab Potential Excellent    Clinical Decision Making Limited treatment options, no task modification necessary    Comorbidities Affecting Occupational Performance: May have comorbidities impacting occupational performance    Modification or Assistance to Complete Evaluation  No modification of tasks or assist necessary to complete  eval    OT Frequency 2x / week    OT Duration 12 weeks    OT Treatment/Interventions Self-care/ADL training;Cryotherapy;Therapeutic exercise;DME and/or AE instruction;Balance training;Neuromuscular education;Manual Therapy;Moist Heat;Contrast Bath;Therapeutic activities;Patient/family education    Consulted and Agree with Plan of Care Patient           Patient will benefit from skilled therapeutic intervention in order to improve the following deficits and impairments:   Body Structure / Function / Physical Skills: ADL, Dexterity, Flexibility, ROM, Strength, Balance, Coordination, FMC, IADL, UE functional use, GMC       Visit Diagnosis: Muscle weakness (generalized)  Other lack of coordination    Problem List Patient Active Problem List   Diagnosis Date Noted  . 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 S/P ablation of atrial fibrillation 12/25/2019  . Food impaction of esophagus     Harrel Carina, MS, OTR/L 03/04/2020, 1:28 PM  [place] MAIN [facility] SERVICES [address] [place], [place], [address] Phone: [phone]   Fax:  [phone]  Name: Carlos Nolan MRN: [id number] Date of Birth: 1965-12-29

## 2020-03-10 ENCOUNTER — Ambulatory Visit: Payer: 59 | Attending: Neurology | Admitting: Occupational Therapy

## 2020-03-10 ENCOUNTER — Encounter: Payer: Self-pay | Admitting: Occupational Therapy

## 2020-03-10 ENCOUNTER — Other Ambulatory Visit: Payer: Self-pay

## 2020-03-10 ENCOUNTER — Ambulatory Visit: Payer: 59

## 2020-03-10 DIAGNOSIS — R29810 Facial weakness: Secondary | ICD-10-CM | POA: Diagnosis not present

## 2020-03-10 DIAGNOSIS — M6281 Muscle weakness (generalized): Secondary | ICD-10-CM | POA: Diagnosis present

## 2020-03-10 DIAGNOSIS — R278 Other lack of coordination: Secondary | ICD-10-CM

## 2020-03-10 NOTE — Therapy (Signed)
[place] MAIN [facility] SERVICES [address] [place], [place], [address] Phone: [phone]   Fax:  [phone]  Occupational Therapy Treatment  Patient Details  Name: Carlos Nolan MRN: [id number] Date of Birth: [birth year] No data recorded  Encounter Date: 03/10/2020   OT End of Session - 03/10/20 1454    Visit Number 14    Number of Visits 24    Date for OT Re-Evaluation 05/20/20    OT Start Time 1100    OT Stop Time 1145    OT Time Calculation (min) 45 min    Activity Tolerance Patient tolerated treatment well    Behavior During Therapy [facility] for tasks assessed/performed           Past Medical History:  Diagnosis Date  . Atrial fibrillation ([place])   . COVID-19 01/2019   Sept 2020    Past Surgical History:  Procedure Laterality Date  . APPENDECTOMY    . CARDIAC ELECTROPHYSIOLOGY STUDY AND ABLATION  04/24/2015  . ELBOW FRACTURE SURGERY    . ESOPHAGOGASTRODUODENOSCOPY N/A 01/11/2016   Procedure: ESOPHAGOGASTRODUODENOSCOPY (EGD);  Surgeon: Mauri Pole, MD;  Location: [facility];  Service: Endoscopy;  Laterality: N/A;  . FEMUR FRACTURE SURGERY    . HARDWARE REMOVAL Left 08/22/2019   Procedure: HARDWARE REMOVAL FROM LEFT ELBOW;  Surgeon: Corky Mull, MD;  Location: [facility] ORS;  Service: Orthopedics;  Laterality: Left;  . LOOP RECORDER INSERTION N/A 01/21/2020   Procedure: LOOP RECORDER INSERTION;  Surgeon: Isaias Cowman, MD;  Location: [place] CV LAB;  Service: Cardiovascular;  Laterality: N/A;  . SHOULDER ARTHROSCOPY Right 06/15/2016   Procedure: Arthroscopic labral debridement, arthroscopic subacromial decompression, and mini-open bursectomy with exploration of rotator cuff, right shoulder;  Surgeon: Corky Mull, MD;  Location: [place];  Service: Orthopedics;  Laterality: Right;    There were no vitals filed for this visit.   Subjective Assessment - 03/10/20 1453    Subjective  Pt reports doing well,  denies complaints aside from wanting to get his LUE "back to normal."    Patient is accompanied by: Family member    Patient Stated Goals Pt reports he would like to be able to golf again, be as independent as possible.    Currently in Pain? No/denies          OT TREATMENT   Neuro muscular re-education:  Pt. worked on UE motor control, and coordination skills while handling, and using tools. Pt. worked on Contractor, IT trainer, and screw drivers to remove screws, and bolts of varying sizes. Pt. worked to further challenging the use of the bolt board positioned at various angles. Pt. worked on [facility] skills using the [employer] Task. Pt. worked on sustaining left hand grasp on the resistive tweezers while grasping this sticks, and moving them from a horizontal position to a vertical position to prepare for placing them into the pegboard. Pt. required verbal cues, and cues for visual demonstration for wrist position, and hand pattern when placing them into the pegboard.  Pt. had a follow-up appointment with his Neurologist. Pt. is making progress overall with left hand function skills, and [facility] skills with the LUE sustained in elevation, as well as when challenged with distractions. Pt. was able to use his left hand to manage tools at a flat tabletop surface. Pt. worked on using tools to manage various sized bolts on the bolt board while positioned at a vertical angle.Pt. continues to have difficulty  reaching posteriorly to grasp a wallet from his pocket, and has difficulty with motor control using the ATM while reaching out to the side in abduction to press the buttons. Pt. continues to work on improving LUE strength, and [facility] skill in order to work towards increasing engagement of the LUE during more ADLs, and IADL tasks.                            OT Education - 03/10/20 1454    Education Details Ther. ex., [place], speed, dexterity    Person(s)  Educated Patient    Methods Demonstration;Explanation    Comprehension Verbalized understanding;Returned demonstration               OT Long Term Goals - 02/26/20 1153      OT LONG TERM GOAL #1   Title Pt will be independent with home exercise program for strength, ROM and coordination    Baseline Independent    Time 12    Period Weeks    Status Achieved      OT LONG TERM GOAL #2   Title Pt to improve grip by 10# to be able to hold objects without dropping    Baseline left grip 78#    Time 12    Period Weeks    Status Achieved      OT LONG TERM GOAL #3   Title Pt will complete cutting and chopping food for meal prep and self feeding with modified independence    Baseline Pt. is able to cut food.    Time 12    Period Weeks    Status Achieved      OT LONG TERM GOAL #4   Title Pt will improve FOTO score by 10 points to show a clinically relevant change in ability to perform necessary self care and IADL tasks with greater ease during ADL tasks.    Baseline FOTO 63    Time 12    Period Weeks    Status On-going    Target Date 05/20/20      OT LONG TERM GOAL #5   Title Pt will demonstrate use of keyboard with bilateral UEs with good speed and minimal errors.    Baseline Typing speed 35 wpm with 92% accuracy.    Time 12    Period Weeks    Status On-going    Target Date 05/21/19      Long Term Additional Goals   Additional Long Term Goals Yes      OT LONG TERM GOAL #6   Title Pt will demonstrate secure grip on golf club during all phases of swing.    Baseline Pt. still has difficulty with sesantion, and  securing a grip.    Time 12    Period Weeks    Status On-going    Target Date 05/20/20      OT LONG TERM GOAL #7   Title Patient will demonstrate improved grip to squeeze out toothpaste with left hand with modified independence.    Baseline Independent    Time 6    Period Weeks    Status Achieved      OT LONG TERM GOAL #8   Title Pt. will independently, and  efficiently pick up utensils from a table, move them in his hand to prepare for use, and place them back onto the table while talking, and attending to other stimuli    Baseline Pt. has difficulty with  the fluidity of the movement.    Time 12    Period Weeks    Status New    Target Date 05/20/20      OT LONG TERM GOAL  #9   TITLE Pt. will independently stabilize, and use hand tools with bilateral hands in multiple positions.    Baseline Pt. has difficulty using handf tools with his left hand                 Plan - 03/10/20 1455    Clinical Impression Statement Pt. had a follow-up appointment with his Neurologist. Pt. is making progress overall with left hand function skills, and [facility] skills with the LUE sustained in elevation, as well as when challenged with distractions. Pt. was able to use his left hand to manage tools at a flat tabletop surface. Pt. worked on using tools to manage various sized bolts on the bolt board while positioned at a vertical angle.Pt. continues to have difficulty reaching posteriorly to grasp a wallet from his pocket, and has difficulty with motor control using the ATM while reaching out to the side in abduction to press the buttons. Pt. continues to work on improving LUE strength, and [facility] skill in order to work towards increasing engagement of the LUE during more ADLs, and IADL tasks.    OT Occupational Profile and History Detailed Assessment- Review of Records and additional review of physical, cognitive, psychosocial history related to current functional performance    Occupational performance deficits (Please refer to evaluation for details): ADL's;Work;IADL's;Leisure;Social Participation    Body Structure / Function / Physical Skills ADL;Dexterity;Flexibility;ROM;Strength;Balance;Coordination;FMC;IADL;UE functional use;GMC    Psychosocial Skills Environmental  Adaptations;Routines and Behaviors;Habits    Rehab Potential Excellent    Clinical Decision Making  Limited treatment options, no task modification necessary    Comorbidities Affecting Occupational Performance: May have comorbidities impacting occupational performance    Modification or Assistance to Complete Evaluation  No modification of tasks or assist necessary to complete eval    OT Frequency 2x / week    OT Duration 12 weeks    OT Treatment/Interventions Self-care/ADL training;Cryotherapy;Therapeutic exercise;DME and/or AE instruction;Balance training;Neuromuscular education;Manual Therapy;Moist Heat;Contrast Bath;Therapeutic activities;Patient/family education    Consulted and Agree with Plan of Care Patient           Patient will benefit from skilled therapeutic intervention in order to improve the following deficits and impairments:   Body Structure / Function / Physical Skills: ADL, Dexterity, Flexibility, ROM, Strength, Balance, Coordination, FMC, IADL, UE functional use, GMC   Psychosocial Skills: Environmental  Adaptations, Routines and Behaviors, Habits   Visit Diagnosis: Facial weakness  Other lack of coordination    Problem List Patient Active Problem List   Diagnosis Date Noted  . 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 S/P ablation of atrial fibrillation 12/25/2019  . Food impaction of esophagus     Harrel Carina, MS, OTR/L 03/10/2020, 2:57 PM  [place] MAIN [facility] SERVICES [address] [place], [place], [address] Phone: [phone]   Fax:  [phone]  Name: Carlos Nolan MRN: [id number] Date of Birth: 1965-11-09

## 2020-03-12 ENCOUNTER — Other Ambulatory Visit: Payer: Self-pay

## 2020-03-12 ENCOUNTER — Ambulatory Visit: Payer: 59

## 2020-03-12 ENCOUNTER — Encounter: Payer: Self-pay | Admitting: Occupational Therapy

## 2020-03-12 ENCOUNTER — Ambulatory Visit: Payer: 59 | Admitting: Occupational Therapy

## 2020-03-12 DIAGNOSIS — R29810 Facial weakness: Secondary | ICD-10-CM | POA: Diagnosis not present

## 2020-03-12 DIAGNOSIS — R278 Other lack of coordination: Secondary | ICD-10-CM

## 2020-03-12 DIAGNOSIS — M6281 Muscle weakness (generalized): Secondary | ICD-10-CM

## 2020-03-12 NOTE — Therapy (Signed)
[place] MAIN [facility] SERVICES [address] [place], [place], [address] Phone: [phone]   Fax:  [phone]  Occupational Therapy Treatment/Discharge Note  Patient Details  Name: Carlos Nolan MRN: [id number] Date of Birth: [birth year] No data recorded  Encounter Date: 03/12/2020   OT End of Session - 03/12/20 1120    Visit Number 15    Number of Visits 24    Date for OT Re-Evaluation 05/20/20    OT Start Time 1105    OT Stop Time 1145    OT Time Calculation (min) 40 min    Activity Tolerance Patient tolerated treatment well    Behavior During Therapy [facility] for tasks assessed/performed           Past Medical History:  Diagnosis Date   Atrial fibrillation [facility])    COVID-19 01/2019   Sept 2020    Past Surgical History:  Procedure Laterality Date   APPENDECTOMY     CARDIAC ELECTROPHYSIOLOGY STUDY AND ABLATION  04/24/2015   ELBOW FRACTURE SURGERY     ESOPHAGOGASTRODUODENOSCOPY N/A 01/11/2016   Procedure: ESOPHAGOGASTRODUODENOSCOPY (EGD);  Surgeon: Mauri Pole, MD;  Location: [facility];  Service: Endoscopy;  Laterality: N/A;   FEMUR FRACTURE SURGERY     HARDWARE REMOVAL Left 08/22/2019   Procedure: HARDWARE REMOVAL FROM LEFT ELBOW;  Surgeon: Corky Mull, MD;  Location: [facility] ORS;  Service: Orthopedics;  Laterality: Left;   LOOP RECORDER INSERTION N/A 01/21/2020   Procedure: LOOP RECORDER INSERTION;  Surgeon: Isaias Cowman, MD;  Location: [place] [facility];  Service: Cardiovascular;  Laterality: N/A;   SHOULDER ARTHROSCOPY Right 06/15/2016   Procedure: Arthroscopic labral debridement, arthroscopic subacromial decompression, and mini-open bursectomy with exploration of rotator cuff, right shoulder;  Surgeon: Corky Mull, MD;  Location: [place];  Service: Orthopedics;  Laterality: Right;    There were no vitals filed for this visit.   Subjective Assessment - 03/12/20 1115    Subjective  Pt.  reports that he is ready to discharge.    Patient is accompanied by: Family member    Patient Stated Goals Pt reports he would like to be able to golf again, be as independent as possible.    Currently in Pain? No/denies              [facility] OT Assessment - 03/12/20 0001      Coordination   Left 9 Hole Peg Test 27      Hand Function   Left Hand Grip (lbs) 89    Left Hand Lateral Pinch 27 lbs    Left 3 point pinch 25 lbs    Comment 19          Measurements were obtained and goes were reviewed with the pt. Pt. has made excellent progress, has improved with left hand grip strength, pinch strength, and [place] skills. Pt. has improved with left UE functioning during ADLs,  met most goals, and has improved FOTO score to 97 points. Pt. is now appropriate for discharge from OT services. Pt. Is improving with left hand [facility] skills  Speed, coordination, and dexterity when challenged in elevation. Pt. fatigues when performing [facility] tasks with his left hand while his UE is sustained in elevation. Pt. is now appropriate for discharge from OT services.                   OT Education - 03/12/20 1119    Education Details Ther. ex., [place], speed, dexterity  Person(s) Educated Patient    Methods Demonstration;Explanation    Comprehension Verbalized understanding;Returned demonstration               OT Long Term Goals - 03/12/20 1142      OT LONG TERM GOAL #4   Title Pt will improve FOTO score by 10 points to show a clinically relevant change in ability to perform necessary self care and IADL tasks with greater ease during ADL tasks.    Baseline FOTO 97    Time 12    Period Weeks    Status Achieved      OT LONG TERM GOAL #5   Title Pt will demonstrate use of keyboard with bilateral UEs with good speed and minimal errors.    Baseline Typing speed 35 wpm with 92% accuracy.    Time 12    Period Weeks    Status Partially Met      OT LONG TERM GOAL #6   Title Pt will demonstrate  secure grip on golf club during all phases of swing.    Baseline Pt. still has difficulty with sesantion, and  securing a grip.    Time 12    Period Weeks    Status Partially Met      OT LONG TERM GOAL #8   Title Pt. will independently, and efficiently pick up utensils from a table, move them in his hand to prepare for use, and place them back onto the table while talking, and attending to other stimuli    Baseline Pt. has difficulty with the fluidity of the movement.    Time 12    Period Weeks    Status Achieved      OT LONG TERM GOAL  #9   TITLE Pt. will independently stabilize, and use hand tools with bilateral hands in multiple positions.    Baseline Pt. has difficulty using handf tools with his left hand    Time 12    Period Weeks    Status Achieved                 Plan - 03/12/20 1120    Clinical Impression Statement Measurements were obtained and goes were reviewed with the pt. Pt. has made excellent progress, has improved with left hand grip strength, pinch strength, and [place] skills. Pt. has improved with left UE functioning during ADLs,  met most goals, and has improved FOTO score to 97 points. Pt. is now appropriate for discharge from OT services. Pt. Is improving with left hand [facility] skills  Speed, coordination, and dexterity when challenged in elevation. Pt. fatigues when performing [facility] tasks with his left hand while his UE is sustained in elevation. Pt. is now appropriate for discharge from OT services.     OT Occupational Profile and History Detailed Assessment- Review of Records and additional review of physical, cognitive, psychosocial history related to current functional performance    Occupational performance deficits (Please refer to evaluation for details): ADL's;Work;IADL's;Leisure;Social Participation    Body Structure / Function / Physical Skills ADL;Dexterity;Flexibility;ROM;Strength;Balance;Coordination;FMC;IADL;UE functional use;GMC    Psychosocial Skills  Environmental  Adaptations;Routines and Behaviors;Habits    Rehab Potential Excellent    Clinical Decision Making Limited treatment options, no task modification necessary    Comorbidities Affecting Occupational Performance: May have comorbidities impacting occupational performance    Modification or Assistance to Complete Evaluation  No modification of tasks or assist necessary to complete eval    OT Frequency 2x / week    OT  Duration 12 weeks    OT Treatment/Interventions Self-care/ADL training;Cryotherapy;Therapeutic exercise;DME and/or AE instruction;Balance training;Neuromuscular education;Manual Therapy;Moist Heat;Contrast Bath;Therapeutic activities;Patient/family education    Consulted and Agree with Plan of Care Patient           Patient will benefit from skilled therapeutic intervention in order to improve the following deficits and impairments:   Body Structure / Function / Physical Skills: ADL, Dexterity, Flexibility, ROM, Strength, Balance, Coordination, FMC, IADL, UE functional use, GMC   Psychosocial Skills: Environmental  Adaptations, Routines and Behaviors, Habits   Visit Diagnosis: Muscle weakness (generalized)  Other lack of coordination    Problem List Patient Active Problem List   Diagnosis Date Noted   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

## 2020-03-17 ENCOUNTER — Ambulatory Visit: Payer: 59

## 2020-03-17 ENCOUNTER — Ambulatory Visit: Payer: 59 | Admitting: Occupational Therapy

## 2020-03-19 ENCOUNTER — Ambulatory Visit: Payer: 59 | Admitting: Occupational Therapy

## 2020-03-19 ENCOUNTER — Ambulatory Visit: Payer: 59

## 2020-03-24 ENCOUNTER — Ambulatory Visit: Payer: 59 | Admitting: Occupational Therapy

## 2020-03-24 ENCOUNTER — Ambulatory Visit: Payer: 59

## 2020-03-26 ENCOUNTER — Ambulatory Visit: Payer: 59 | Admitting: Occupational Therapy

## 2020-03-26 ENCOUNTER — Ambulatory Visit: Payer: 59

## 2020-03-31 ENCOUNTER — Ambulatory Visit: Payer: 59

## 2020-03-31 ENCOUNTER — Ambulatory Visit: Payer: 59 | Admitting: Occupational Therapy

## 2020-04-07 ENCOUNTER — Ambulatory Visit: Payer: 59 | Admitting: Occupational Therapy

## 2020-04-07 ENCOUNTER — Ambulatory Visit: Payer: 59

## 2020-04-09 ENCOUNTER — Ambulatory Visit: Payer: 59

## 2020-04-09 ENCOUNTER — Ambulatory Visit: Payer: 59 | Admitting: Occupational Therapy

## 2020-04-14 ENCOUNTER — Ambulatory Visit: Payer: 59 | Admitting: Occupational Therapy

## 2020-04-14 ENCOUNTER — Ambulatory Visit: Payer: 59

## 2020-04-16 ENCOUNTER — Ambulatory Visit: Payer: 59

## 2020-04-16 ENCOUNTER — Ambulatory Visit: Payer: 59 | Admitting: Occupational Therapy

## 2020-04-21 ENCOUNTER — Ambulatory Visit: Payer: 59 | Admitting: Occupational Therapy

## 2020-04-21 ENCOUNTER — Ambulatory Visit: Payer: 59

## 2020-04-23 ENCOUNTER — Ambulatory Visit: Payer: 59

## 2020-04-23 ENCOUNTER — Ambulatory Visit: Payer: 59 | Admitting: Occupational Therapy

## 2020-04-28 ENCOUNTER — Ambulatory Visit: Payer: 59

## 2020-04-28 ENCOUNTER — Ambulatory Visit: Payer: 59 | Admitting: Occupational Therapy

## 2020-04-30 ENCOUNTER — Ambulatory Visit: Payer: 59

## 2020-04-30 ENCOUNTER — Ambulatory Visit: Payer: 59 | Admitting: Occupational Therapy

## 2020-05-05 ENCOUNTER — Ambulatory Visit: Payer: 59

## 2020-05-05 ENCOUNTER — Ambulatory Visit: Payer: 59 | Admitting: Occupational Therapy

## 2020-05-28 ENCOUNTER — Other Ambulatory Visit: Payer: Self-pay | Admitting: Gastroenterology

## 2020-06-23 ENCOUNTER — Other Ambulatory Visit: Payer: Self-pay | Admitting: Neurology

## 2020-06-23 DIAGNOSIS — M5442 Lumbago with sciatica, left side: Secondary | ICD-10-CM

## 2020-06-23 DIAGNOSIS — G8929 Other chronic pain: Secondary | ICD-10-CM

## 2020-07-31 ENCOUNTER — Other Ambulatory Visit: Payer: Self-pay | Admitting: Gastroenterology

## 2020-08-06 NOTE — Progress Notes (Signed)
Attempted to obtain medical history via telephone, unable to reach at this time. I left a voicemail to return pre surgical testing department's phone call.  

## 2020-08-07 ENCOUNTER — Other Ambulatory Visit (HOSPITAL_COMMUNITY)
Admission: RE | Admit: 2020-08-07 | Discharge: 2020-08-07 | Disposition: A | Discharge status: Home / Self Care | Payer: 59 | Source: Ambulatory Visit | Attending: Gastroenterology | Admitting: Gastroenterology

## 2020-08-07 ENCOUNTER — Other Ambulatory Visit: Payer: Self-pay

## 2020-08-07 DIAGNOSIS — Z20822 Contact with and (suspected) exposure to covid-19: Secondary | ICD-10-CM | POA: Insufficient documentation

## 2020-08-07 DIAGNOSIS — Z01812 Encounter for preprocedural laboratory examination: Secondary | ICD-10-CM | POA: Diagnosis not present

## 2020-08-07 LAB — SARS CORONAVIRUS 2 (TAT 6-24 HRS): SARS Coronavirus 2: NEGATIVE

## 2020-08-10 ENCOUNTER — Encounter (HOSPITAL_COMMUNITY): Payer: Self-pay | Admitting: Gastroenterology

## 2020-08-11 ENCOUNTER — Ambulatory Visit (HOSPITAL_COMMUNITY): Payer: 59 | Admitting: Anesthesiology

## 2020-08-11 ENCOUNTER — Other Ambulatory Visit: Payer: Self-pay

## 2020-08-11 ENCOUNTER — Encounter (HOSPITAL_COMMUNITY): Payer: Self-pay | Admitting: Gastroenterology

## 2020-08-11 ENCOUNTER — Ambulatory Visit (HOSPITAL_COMMUNITY)
Admission: RE | Admit: 2020-08-11 | Discharge: 2020-08-11 | Disposition: A | Discharge status: Home / Self Care | Payer: 59 | Attending: Gastroenterology | Admitting: Gastroenterology

## 2020-08-11 ENCOUNTER — Encounter (HOSPITAL_COMMUNITY)
Admission: RE | Disposition: A | Discharge status: Home / Self Care | Payer: Self-pay | Source: Home / Self Care | Attending: Gastroenterology

## 2020-08-11 DIAGNOSIS — K640 First degree hemorrhoids: Secondary | ICD-10-CM | POA: Diagnosis not present

## 2020-08-11 DIAGNOSIS — Z888 Allergy status to other drugs, medicaments and biological substances status: Secondary | ICD-10-CM | POA: Insufficient documentation

## 2020-08-11 DIAGNOSIS — Z7902 Long term (current) use of antithrombotics/antiplatelets: Secondary | ICD-10-CM | POA: Diagnosis not present

## 2020-08-11 DIAGNOSIS — I480 Paroxysmal atrial fibrillation: Secondary | ICD-10-CM | POA: Diagnosis not present

## 2020-08-11 DIAGNOSIS — K5730 Diverticulosis of large intestine without perforation or abscess without bleeding: Secondary | ICD-10-CM | POA: Diagnosis not present

## 2020-08-11 DIAGNOSIS — Z1211 Encounter for screening for malignant neoplasm of colon: Secondary | ICD-10-CM | POA: Diagnosis not present

## 2020-08-11 DIAGNOSIS — Z7982 Long term (current) use of aspirin: Secondary | ICD-10-CM | POA: Diagnosis not present

## 2020-08-11 DIAGNOSIS — Z8673 Personal history of transient ischemic attack (TIA), and cerebral infarction without residual deficits: Secondary | ICD-10-CM | POA: Insufficient documentation

## 2020-08-11 DIAGNOSIS — Z86010 Personal history of colonic polyps: Secondary | ICD-10-CM

## 2020-08-11 DIAGNOSIS — Z820 Family history of epilepsy and other diseases of the nervous system: Secondary | ICD-10-CM | POA: Insufficient documentation

## 2020-08-11 DIAGNOSIS — Z880 Allergy status to penicillin: Secondary | ICD-10-CM | POA: Insufficient documentation

## 2020-08-11 DIAGNOSIS — Z833 Family history of diabetes mellitus: Secondary | ICD-10-CM | POA: Diagnosis not present

## 2020-08-11 DIAGNOSIS — Z79899 Other long term (current) drug therapy: Secondary | ICD-10-CM | POA: Diagnosis not present

## 2020-08-11 DIAGNOSIS — Z860101 Personal history of adenomatous and serrated colon polyps: Secondary | ICD-10-CM

## 2020-08-11 DIAGNOSIS — D124 Benign neoplasm of descending colon: Secondary | ICD-10-CM | POA: Diagnosis not present

## 2020-08-11 DIAGNOSIS — Z8249 Family history of ischemic heart disease and other diseases of the circulatory system: Secondary | ICD-10-CM | POA: Diagnosis not present

## 2020-08-11 HISTORY — PX: COLONOSCOPY WITH PROPOFOL: SHX5780

## 2020-08-11 HISTORY — PX: POLYPECTOMY: SHX5525

## 2020-08-11 SURGERY — COLONOSCOPY WITH PROPOFOL
Anesthesia: Monitor Anesthesia Care

## 2020-08-11 MED ORDER — PROPOFOL 500 MG/50ML IV EMUL
INTRAVENOUS | Status: DC | PRN
  Administered 2020-08-11: 100 ug/kg/min via INTRAVENOUS

## 2020-08-11 MED ORDER — SODIUM CHLORIDE 0.9 % IV SOLN: INTRAVENOUS | Status: DC

## 2020-08-11 MED ORDER — LACTATED RINGERS IV SOLN: INTRAVENOUS | Status: DC

## 2020-08-11 MED ORDER — PROPOFOL 10 MG/ML IV BOLUS
INTRAVENOUS | Status: AC
  Filled 2020-08-11: qty 20

## 2020-08-11 MED ORDER — MIDAZOLAM HCL 2 MG/2ML IJ SOLN
INTRAMUSCULAR | Status: AC
  Filled 2020-08-11: qty 2

## 2020-08-11 MED ORDER — PROPOFOL 500 MG/50ML IV EMUL
INTRAVENOUS | Status: DC | PRN
  Administered 2020-08-11: 30 mg via INTRAVENOUS
  Administered 2020-08-11: 20 mg via INTRAVENOUS

## 2020-08-11 MED ORDER — ONDANSETRON HCL 4 MG/2ML IJ SOLN
INTRAMUSCULAR | Status: DC | PRN
  Administered 2020-08-11: 4 mg via INTRAVENOUS

## 2020-08-11 MED ORDER — MIDAZOLAM HCL 5 MG/5ML IJ SOLN
INTRAMUSCULAR | Status: DC | PRN
  Administered 2020-08-11: 2 mg via INTRAVENOUS

## 2020-08-11 SURGICAL SUPPLY — 21 items

## 2020-08-11 NOTE — Discharge Instructions (Signed)
YOU HAD AN ENDOSCOPIC PROCEDURE TODAY: Refer to the procedure report and other information in the discharge instructions given to you for any specific questions about what was found during the examination. If this information does not answer your questions, please call [facility] office at [phone] to clarify.   YOU SHOULD EXPECT: Some feelings of bloating in the abdomen. Passage of more gas than usual. Walking can help get rid of the air that was put into your GI tract during the procedure and reduce the bloating. If you had a lower endoscopy (such as a colonoscopy or flexible sigmoidoscopy) you may notice spotting of blood in your stool or on the toilet paper. Some abdominal soreness may be present for a day or two, also.  DIET: Your first meal following the procedure should be a light meal and then it is ok to progress to your normal diet. A half-sandwich or bowl of soup is an example of a good first meal. Heavy or fried foods are harder to digest and may make you feel nauseous or bloated. Drink plenty of fluids but you should avoid alcoholic beverages for 24 hours. If you had a esophageal dilation, please see attached instructions for diet.    ACTIVITY: Your care partner should take you home directly after the procedure. You should plan to take it easy, moving slowly for the rest of the day. You can resume normal activity the day after the procedure however YOU SHOULD NOT DRIVE, use power tools, machinery or perform tasks that involve climbing or major physical exertion for 24 hours (because of the sedation medicines used during the test).   SYMPTOMS TO REPORT IMMEDIATELY: A gastroenterologist can be reached at any hour. Please call [phone]  for any of the following symptoms:  . Following lower endoscopy (colonoscopy, flexible sigmoidoscopy) Excessive amounts of blood in the stool  Significant tenderness, worsening of abdominal pains  Swelling of the abdomen that is new, acute  Fever of 100  or higher  . Following upper endoscopy (EGD, EUS, ERCP, esophageal dilation) Vomiting of blood or coffee ground material  New, significant abdominal pain  New, significant chest pain or pain under the shoulder blades  Painful or persistently difficult swallowing  New shortness of breath  Black, tarry-looking or red, bloody stools  FOLLOW UP:  If any biopsies were taken you will be contacted by phone or by letter within the next 1-3 weeks. Call [phone]  if you have not heard about the biopsies in 3 weeks.  Please also call with any specific questions about appointments or follow up tests. YOU HAD AN ENDOSCOPIC PROCEDURE TODAY: Refer to the procedure report and other information in the discharge instructions given to you for any specific questions about what was found during the examination. If this information does not answer your questions, please call [facility] office at [phone] to clarify.   YOU SHOULD EXPECT: Some feelings of bloating in the abdomen. Passage of more gas than usual. Walking can help get rid of the air that was put into your GI tract during the procedure and reduce the bloating. If you had a lower endoscopy (such as a colonoscopy or flexible sigmoidoscopy) you may notice spotting of blood in your stool or on the toilet paper. Some abdominal soreness may be present for a day or two, also.  DIET: Your first meal following the procedure should be a light meal and then it is ok to progress to your normal diet. A half-sandwich or bowl of soup   is an example of a good first meal. Heavy or fried foods are harder to digest and may make you feel nauseous or bloated. Drink plenty of fluids but you should avoid alcoholic beverages for 24 hours. If you had a esophageal dilation, please see attached instructions for diet.    ACTIVITY: Your care partner should take you home directly after the procedure. You should plan to take it easy, moving slowly for the rest of the day. You can  resume normal activity the day after the procedure however YOU SHOULD NOT DRIVE, use power tools, machinery or perform tasks that involve climbing or major physical exertion for 24 hours (because of the sedation medicines used during the test).   SYMPTOMS TO REPORT IMMEDIATELY: A gastroenterologist can be reached at any hour. Please call [phone]  for any of the following symptoms:   Following lower endoscopy (colonoscopy, flexible sigmoidoscopy) Excessive amounts of blood in the stool  Significant tenderness, worsening of abdominal pains  Swelling of the abdomen that is new, acute  Fever of 100 or higher   FOLLOW UP:  If any biopsies were taken you will be contacted by phone or by letter within the next 1-3 weeks. Call [phone]  if you have not heard about the biopsies in 3 weeks.  Please also call with any specific questions about appointments or follow up tests.   HOLD XARELTO until Friday April 8 and then resume.

## 2020-08-11 NOTE — Op Note (Signed)
[facility] Patient Name: Carlos Nolan Procedure Date: 08/11/2020 MRN: [id number] Attending MD: [name] , MD Date of Birth: 1966/05/08 CSN: [id number] Age: 55 Admit Type: Outpatient Procedure:                Colonoscopy Indications:              High risk colon cancer surveillance: Personal                            history of colonic polyps, Last colonoscopy:                            February 2016 Providers:                [name], MD, Cleda Daub, RN, Benetta Spar, Technician Referring MD:             Kathyrn Lass Medicines:                Propofol per Anesthesia, Monitored Anesthesia Care Complications:            No immediate complications. Estimated Blood Loss:     Estimated blood loss: none. Procedure:                Pre-Anesthesia Assessment:                           - Prior to the procedure, a History and Physical                            was performed, and patient medications and                            allergies were reviewed. The patient's tolerance of                            previous anesthesia was also reviewed. The risks                            and benefits of the procedure and the sedation                            options and risks were discussed with the patient.                            All questions were answered, and informed consent                            was obtained. Prior Anticoagulants: The patient has                            taken Xarelto (rivaroxaban), last dose was 3 days  prior to procedure. ASA Grade Assessment: III - A                            patient with severe systemic disease. After                            reviewing the risks and benefits, the patient was                            deemed in satisfactory condition to undergo the                            procedure.                           After obtaining informed consent,  the colonoscope                            was passed under direct vision. Throughout the                            procedure, the patient's blood pressure, pulse, and                            oxygen saturations were monitored continuously. The                            PCF-H190DL ([id number]) Olympus pediatric colonscope                            was introduced through the anus and advanced to the                            the cecum, identified by appendiceal orifice and                            ileocecal valve. The colonoscopy was performed                            without difficulty. The patient tolerated the                            procedure well. The quality of the bowel                            preparation was fair and fair but repeated                            irrigation led to a good and adequate prep. The                            terminal ileum, ileocecal valve, appendiceal  orifice, and rectum were photographed. Scope In: 9:26:23 AM Scope Out: 9:47:10 AM Scope Withdrawal Time: 0 hours 17 minutes 5 seconds  Total Procedure Duration: 0 hours 20 minutes 47 seconds  Findings:      The perianal and digital rectal examinations were normal.      A 10 mm polyp was found in the descending colon. The polyp was sessile.       The polyp was removed with a hot snare. Resection and retrieval were       complete. Estimated blood loss: none.      A few small and large-mouthed diverticula were found in the sigmoid       colon.      Internal hemorrhoids were found during retroflexion. The hemorrhoids       were medium-sized and Grade I (internal hemorrhoids that do not       prolapse).      The terminal ileum appeared normal. Impression:               - Preparation of the colon was fair.                           - One 10 mm polyp in the descending colon, removed                            with a hot snare. Resected and retrieved.                            - Diverticulosis in the sigmoid colon.                           - Internal hemorrhoids.                           - The examined portion of the ileum was normal. Moderate Sedation:      Not Applicable - Patient had care per Anesthesia. Recommendation:           - Patient has a contact number available for                            emergencies. The signs and symptoms of potential                            delayed complications were discussed with the                            patient. Return to normal activities tomorrow.                            Written discharge instructions were provided to the                            patient.                           - High fiber diet.                           -  Await pathology results.                           - Resume Xarelto (rivaroxaban) at prior dose in 3                            days.                           - Repeat colonoscopy for surveillance based on                            pathology results. Procedure Code(s):        --- Professional ---                           [phone], Colonoscopy, flexible; with removal of                            tumor(s), polyp(s), or other lesion(s) by snare                            technique Diagnosis Code(s):        --- Professional ---                           Z86.010, Personal history of colonic polyps                           K63.5, Polyp of colon                           K64.0, First degree hemorrhoids                           K57.30, Diverticulosis of large intestine without                            perforation or abscess without bleeding CPT copyright 2019 American Medical Association. All rights reserved. The codes documented in this report are preliminary and upon coder review may  be revised to meet current compliance requirements. [name], MD 08/11/2020 9:59:26 AM This report has been signed electronically. Number of Addenda: 0

## 2020-08-11 NOTE — Interval H&P Note (Signed)
History and Physical Interval Note:  08/11/2020 9:15 AM  Carlos Nolan  has presented today for surgery, with the diagnosis of History of colon polyps.  The various methods of treatment have been discussed with the patient and family. After consideration of risks, benefits and other options for treatment, the patient has consented to  Procedure(s): COLONOSCOPY WITH PROPOFOL (N/A) as a surgical intervention.  The patient's history has been reviewed, patient examined, no change in status, stable for surgery.  I have reviewed the patient's chart and labs.  Questions were answered to the patient's satisfaction.     [name]

## 2020-08-11 NOTE — Anesthesia Postprocedure Evaluation (Signed)
Anesthesia Post Note  Patient: Carlos Nolan  Procedure(s) Performed: COLONOSCOPY WITH PROPOFOL (N/A ) POLYPECTOMY     Patient location during evaluation: PACU Anesthesia Type: MAC Level of consciousness: awake and alert Pain management: pain level controlled Vital Signs Assessment: post-procedure vital signs reviewed and stable Respiratory status: spontaneous breathing, nonlabored ventilation and respiratory function stable Cardiovascular status: blood pressure returned to baseline and stable Postop Assessment: no apparent nausea or vomiting Anesthetic complications: no   No complications documented.  Last Vitals:  Vitals:   08/11/20 0809 08/11/20 0956  BP: (!) 170/82 132/76  Pulse: 94 74  Resp: (!) 24   Temp: 37.1 C 36.6 C  SpO2: 98% 100%    Last Pain:  Vitals:   08/11/20 0956  TempSrc: Oral  PainSc: 0-No pain                 Pervis Hocking

## 2020-08-11 NOTE — Progress Notes (Signed)
Pt transferred out of Endo via w/c with belongings. A/Ox3. Skin w/d/pink. Resp wnl, equal and non-labored. NAD. No complaints voiced. Pt able to get into car on his own. Family driving him home.

## 2020-08-11 NOTE — Transfer of Care (Signed)
Immediate Anesthesia Transfer of Care Note  Patient: Carlos Nolan  Procedure(s) Performed: Procedure(s): COLONOSCOPY WITH PROPOFOL (N/A) POLYPECTOMY  Patient Location: PACU  Anesthesia Type:General  Level of Consciousness:  sedated, patient cooperative and responds to stimulation  Airway & Oxygen Therapy:Patient Spontanous Breathing and Patient connected to face mask oxgen  Post-op Assessment:  Report given to PACU RN and Post -op Vital signs reviewed and stable  Post vital signs:  Reviewed and stable  Last Vitals:  Vitals:   08/11/20 0809  BP: (!) 170/82  Pulse: 94  Resp: (!) 24  Temp: 37.1 C  SpO2: 77%    Complications: No apparent anesthesia complications

## 2020-08-11 NOTE — H&P (Signed)
Date of Initial H&P: 08/03/20  History reviewed, patient examined, no change in status, stable for surgery.

## 2020-08-11 NOTE — Anesthesia Preprocedure Evaluation (Addendum)
Anesthesia Evaluation  Patient identified by MRN, date of birth, ID band Patient awake    Reviewed: Allergy & Precautions, NPO status , Patient's Chart, lab work & pertinent test results, reviewed documented beta blocker date and time   Airway Mallampati: III  TM Distance: >3 FB Neck ROM: Full    Dental no notable dental hx. (+) Teeth Intact, Dental Advisory Given   Pulmonary  Occasionally snores at night, had sleep study multiple years ago that was negative   Pulmonary exam normal breath sounds clear to auscultation       Cardiovascular hypertension, Normal cardiovascular exam+ dysrhythmias (metoprolol, xarelto) Atrial Fibrillation  Rhythm:Regular Rate:Normal  Echo 2021: 1. Left ventricular ejection fraction, by estimation, is 60 to 65%. The  left ventricle has normal function. The left ventricle has no regional  wall motion abnormalities. Left ventricular diastolic parameters were  normal.  2. Right ventricular systolic function is normal. The right ventricular  size is normal. Tricuspid regurgitation signal is inadequate for assessing  PA pressure.  3. Left atrial size was mildly dilated.     BP 174/94 in preop- does not take any BP meds In and out of bigeminy, not currently in Afib Has holter monitor    Neuro/Psych CVA (residual numbness, tingling, weakness L side), Residual Symptoms negative psych ROS   GI/Hepatic Neg liver ROS, Hx polyps   Endo/Other  Obesity BMI 31  Renal/GU negative Renal ROS  negative genitourinary   Musculoskeletal negative musculoskeletal ROS (+)   Abdominal   Peds  Hematology negative hematology ROS (+)   Anesthesia Other Findings   Reproductive/Obstetrics negative OB ROS                           Anesthesia Physical Anesthesia Plan  ASA: III  Anesthesia Plan: MAC   Post-op Pain Management:    Induction:   PONV Risk Score and Plan: 2 and  Propofol infusion and TIVA  Airway Management Planned: Natural Airway and Simple Face Mask  Additional Equipment: None  Intra-op Plan:   Post-operative Plan:   Informed Consent: I have reviewed the patients History and Physical, chart, labs and discussed the procedure including the risks, benefits and alternatives for the proposed anesthesia with the patient or authorized representative who has indicated his/her understanding and acceptance.       Plan Discussed with: CRNA  Anesthesia Plan Comments: (Poorly controlled HTN, in and out of bigeminy )       Anesthesia Quick Evaluation

## 2020-08-12 ENCOUNTER — Encounter (HOSPITAL_COMMUNITY): Payer: Self-pay | Admitting: Gastroenterology

## 2020-08-12 LAB — SURGICAL PATHOLOGY

## 2022-03-10 ENCOUNTER — Emergency Department: Payer: 59

## 2022-03-10 ENCOUNTER — Other Ambulatory Visit: Payer: Self-pay

## 2022-03-10 ENCOUNTER — Encounter: Payer: Self-pay | Admitting: Radiology

## 2022-03-10 ENCOUNTER — Observation Stay: Payer: 59

## 2022-03-10 ENCOUNTER — Observation Stay
Admission: EM | Admit: 2022-03-10 | Discharge: 2022-03-12 | Disposition: A | Discharge status: Home / Self Care | Payer: 59 | Attending: Internal Medicine | Admitting: Internal Medicine

## 2022-03-10 DIAGNOSIS — Z8673 Personal history of transient ischemic attack (TIA), and cerebral infarction without residual deficits: Secondary | ICD-10-CM | POA: Diagnosis not present

## 2022-03-10 DIAGNOSIS — I10 Essential (primary) hypertension: Secondary | ICD-10-CM | POA: Diagnosis not present

## 2022-03-10 DIAGNOSIS — Z8616 Personal history of COVID-19: Secondary | ICD-10-CM | POA: Diagnosis not present

## 2022-03-10 DIAGNOSIS — I482 Chronic atrial fibrillation, unspecified: Secondary | ICD-10-CM | POA: Diagnosis not present

## 2022-03-10 DIAGNOSIS — I63311 Cerebral infarction due to thrombosis of right middle cerebral artery: Secondary | ICD-10-CM | POA: Diagnosis not present

## 2022-03-10 DIAGNOSIS — I639 Cerebral infarction, unspecified: Principal | ICD-10-CM | POA: Insufficient documentation

## 2022-03-10 DIAGNOSIS — Z79899 Other long term (current) drug therapy: Secondary | ICD-10-CM | POA: Diagnosis not present

## 2022-03-10 DIAGNOSIS — Z9889 Other specified postprocedural states: Secondary | ICD-10-CM

## 2022-03-10 DIAGNOSIS — Z7901 Long term (current) use of anticoagulants: Secondary | ICD-10-CM | POA: Diagnosis not present

## 2022-03-10 DIAGNOSIS — Z7982 Long term (current) use of aspirin: Secondary | ICD-10-CM | POA: Insufficient documentation

## 2022-03-10 DIAGNOSIS — Z8679 Personal history of other diseases of the circulatory system: Secondary | ICD-10-CM

## 2022-03-10 DIAGNOSIS — R531 Weakness: Secondary | ICD-10-CM | POA: Diagnosis present

## 2022-03-10 DIAGNOSIS — E785 Hyperlipidemia, unspecified: Secondary | ICD-10-CM | POA: Diagnosis present

## 2022-03-10 LAB — DIFFERENTIAL
Abs Immature Granulocytes: 0.01 10*3/uL (ref 0.00–0.07)
Basophils Absolute: 0.1 10*3/uL (ref 0.0–0.1)
Basophils Relative: 1 %
Eosinophils Absolute: 0.7 10*3/uL — ABNORMAL HIGH (ref 0.0–0.5)
Eosinophils Relative: 10 %
Immature Granulocytes: 0 %
Lymphocytes Relative: 28 %
Lymphs Abs: 1.7 10*3/uL (ref 0.7–4.0)
Monocytes Absolute: 0.6 10*3/uL (ref 0.1–1.0)
Monocytes Relative: 10 %
Neutro Abs: 3.2 10*3/uL (ref 1.7–7.7)
Neutrophils Relative %: 51 %

## 2022-03-10 LAB — COMPREHENSIVE METABOLIC PANEL
ALT: 37 U/L (ref 0–44)
AST: 33 U/L (ref 15–41)
Albumin: 3.7 g/dL (ref 3.5–5.0)
Alkaline Phosphatase: 136 U/L — ABNORMAL HIGH (ref 38–126)
Anion gap: 7 (ref 5–15)
BUN: 15 mg/dL (ref 6–20)
CO2: 27 mmol/L (ref 22–32)
Calcium: 8.7 mg/dL — ABNORMAL LOW (ref 8.9–10.3)
Chloride: 104 mmol/L (ref 98–111)
Creatinine, Ser: 1.1 mg/dL (ref 0.61–1.24)
GFR, Estimated: >60 mL/min (ref >60)
Glucose, Bld: 108 mg/dL — ABNORMAL HIGH (ref 70–99)
Potassium: 3.9 mmol/L (ref 3.5–5.1)
Sodium: 138 mmol/L (ref 135–145)
Total Bilirubin: 0.7 mg/dL (ref 0.3–1.2)
Total Protein: 7.2 g/dL (ref 6.5–8.1)

## 2022-03-10 LAB — CBC
HCT: 40.1 % (ref 39.0–52.0)
Hemoglobin: 14.1 g/dL (ref 13.0–17.0)
MCH: 31.8 pg (ref 26.0–34.0)
MCHC: 35.2 g/dL (ref 30.0–36.0)
MCV: 90.5 fL (ref 80.0–100.0)
Platelets: 163 10*3/uL (ref 150–400)
RBC: 4.43 MIL/uL (ref 4.22–5.81)
RDW: 11.9 % (ref 11.5–15.5)
WBC: 6.3 10*3/uL (ref 4.0–10.5)
nRBC: 0 % (ref 0.0–0.2)

## 2022-03-10 LAB — ETHANOL: Alcohol, Ethyl (B): <10 mg/dL (ref <10)

## 2022-03-10 LAB — PROTIME-INR
INR: 1.4 — ABNORMAL HIGH (ref 0.8–1.2)
Prothrombin Time: 17.3 seconds — ABNORMAL HIGH (ref 11.4–15.2)

## 2022-03-10 LAB — CBG MONITORING, ED: Glucose-Capillary: 113 mg/dL — ABNORMAL HIGH (ref 70–99)

## 2022-03-10 LAB — APTT: aPTT: 40 seconds — ABNORMAL HIGH (ref 24–36)

## 2022-03-10 MED ORDER — FLECAINIDE ACETATE 50 MG PO TABS
50.0000 mg | ORAL_TABLET | Freq: Two times a day (BID) | ORAL | Status: DC
  Administered 2022-03-10 – 2022-03-12 (×4): 50 mg via ORAL
  Filled 2022-03-10 (×5): qty 1

## 2022-03-10 MED ORDER — ASPIRIN 325 MG PO TBEC
325.0000 mg | DELAYED_RELEASE_TABLET | Freq: Once | ORAL | Status: AC
  Administered 2022-03-10: 325 mg via ORAL
  Filled 2022-03-10: qty 1

## 2022-03-10 MED ORDER — IOHEXOL 350 MG/ML SOLN: 100.0000 mL | Freq: Once | INTRAVENOUS | Status: DC | PRN

## 2022-03-10 MED ORDER — ATORVASTATIN CALCIUM 20 MG PO TABS
40.0000 mg | ORAL_TABLET | Freq: Every day | ORAL | Status: DC
  Administered 2022-03-11: 40 mg via ORAL
  Filled 2022-03-10: qty 2

## 2022-03-10 MED ORDER — IOHEXOL 350 MG/ML SOLN
100.0000 mL | Freq: Once | INTRAVENOUS | Status: AC | PRN
  Administered 2022-03-10: 100 mL via INTRAVENOUS

## 2022-03-10 MED ORDER — GABAPENTIN 300 MG PO CAPS
900.0000 mg | ORAL_CAPSULE | Freq: Every day | ORAL | Status: DC
  Administered 2022-03-10 – 2022-03-11 (×2): 900 mg via ORAL
  Filled 2022-03-10 (×2): qty 3

## 2022-03-10 MED ORDER — FLUTICASONE PROPIONATE 50 MCG/ACT NA SUSP: 1.0000 | Freq: Every day | NASAL | Status: DC | PRN

## 2022-03-10 MED ORDER — ACETAMINOPHEN 650 MG RE SUPP: 650.0000 mg | RECTAL | Status: DC | PRN

## 2022-03-10 MED ORDER — NORTRIPTYLINE HCL 10 MG PO CAPS
40.0000 mg | ORAL_CAPSULE | Freq: Every day | ORAL | Status: DC
  Administered 2022-03-10 – 2022-03-11 (×2): 40 mg via ORAL
  Filled 2022-03-10 (×3): qty 4

## 2022-03-10 MED ORDER — LORAZEPAM 2 MG/ML IJ SOLN
1.0000 mg | Freq: Once | INTRAMUSCULAR | Status: AC | PRN
  Administered 2022-03-10: 1 mg via INTRAVENOUS
  Filled 2022-03-10: qty 1

## 2022-03-10 MED ORDER — ASPIRIN 325 MG PO TBEC
325.0000 mg | DELAYED_RELEASE_TABLET | Freq: Every day | ORAL | Status: DC
  Administered 2022-03-11 – 2022-03-12 (×2): 325 mg via ORAL
  Filled 2022-03-10 (×2): qty 1

## 2022-03-10 MED ORDER — GABAPENTIN 300 MG PO CAPS: 300.0000 mg | ORAL_CAPSULE | ORAL | Status: DC

## 2022-03-10 MED ORDER — ACETAMINOPHEN 160 MG/5ML PO SOLN: 650.0000 mg | ORAL | Status: DC | PRN

## 2022-03-10 MED ORDER — HYDRALAZINE HCL 20 MG/ML IJ SOLN: 5.0000 mg | Freq: Four times a day (QID) | INTRAMUSCULAR | Status: DC | PRN

## 2022-03-10 MED ORDER — HEPARIN SODIUM (PORCINE) 5000 UNIT/ML IJ SOLN
5000.0000 [IU] | Freq: Three times a day (TID) | INTRAMUSCULAR | Status: DC
  Administered 2022-03-10 – 2022-03-11 (×2): 5000 [IU] via SUBCUTANEOUS
  Filled 2022-03-10 (×2): qty 1

## 2022-03-10 MED ORDER — LABETALOL HCL 5 MG/ML IV SOLN: 5.0000 mg | INTRAVENOUS | Status: DC | PRN

## 2022-03-10 MED ORDER — ASPIRIN 325 MG PO TBEC: 325.0000 mg | DELAYED_RELEASE_TABLET | Freq: Once | ORAL | Status: DC

## 2022-03-10 MED ORDER — STROKE: EARLY STAGES OF RECOVERY BOOK: Freq: Once | Status: AC

## 2022-03-10 MED ORDER — GABAPENTIN 300 MG PO CAPS
300.0000 mg | ORAL_CAPSULE | Freq: Every day | ORAL | Status: DC
  Administered 2022-03-11 – 2022-03-12 (×2): 300 mg via ORAL
  Filled 2022-03-10 (×2): qty 1

## 2022-03-10 MED ORDER — ACETAMINOPHEN 325 MG PO TABS: 650.0000 mg | ORAL_TABLET | ORAL | Status: DC | PRN

## 2022-03-10 MED ORDER — SODIUM CHLORIDE 0.9% FLUSH
3.0000 mL | Freq: Once | INTRAVENOUS | Status: AC
  Administered 2022-03-10: 3 mL via INTRAVENOUS

## 2022-03-10 MED ORDER — ASPIRIN 300 MG RE SUPP: 300.0000 mg | Freq: Once | RECTAL | Status: AC

## 2022-03-10 MED ORDER — SENNOSIDES-DOCUSATE SODIUM 8.6-50 MG PO TABS: 1.0000 | ORAL_TABLET | Freq: Every evening | ORAL | Status: DC | PRN

## 2022-03-10 NOTE — ED Notes (Signed)
RN to bedside to introduce self to pt. Pt is CAOx4 and in no acute signs of distress. Pt advised he had a previous stroke 12/2019 with deficits on the left side. Pt feels his previous symptoms have worsened. Pt was at work when this happened and had to drive home first then come here.

## 2022-03-10 NOTE — ED Notes (Signed)
Pt taken to CT 3 at this time

## 2022-03-10 NOTE — Assessment & Plan Note (Addendum)
Continue flecainide 50 mg p.o. twice daily Stopped Xarelto Started Eliquis

## 2022-03-10 NOTE — Assessment & Plan Note (Addendum)
Xarelto transitioned to Eliquis given stroke while on Xarelto

## 2022-03-10 NOTE — Assessment & Plan Note (Addendum)
Presented with left-sided weakness with brain MRI showing 2 cm acute infarct at the right frontal semi ovale.   -- Neurology consulted --Permissive hypertension --PT OT recommending outpatient rehab, pt agreeable --No SLP needs --Echo report is pending --Telemetry --Xarelto is on hold -- Started on Eliquis 5 mg twice daily  --Continue Lipitor 40 mg daily --Risk factor reduction including BP control, Mediterranean diet recommended

## 2022-03-10 NOTE — ED Provider Notes (Signed)
[facility] Provider Note    Event Date/Time   First MD Initiated Contact with Patient 03/10/22 1657     (approximate)   History   Code Stroke   HPI  Carlos Nolan is a 56 y.o. male with past medical history of atrial fibrillation, prior stroke, here with left-sided weakness.  The patient states that he was at his baseline.  He has a history of an old right-sided stroke with chronic left-sided weakness and numbness, but noticed acute, severe worsening of his weakness while he was at work at around 1 PM today.  Patient states he was walking when he noticed that his leg was dragging and he is having difficulty moving.  This is new and significantly above his baseline.  Patient states he is also had weakness and difficulty performing tasks with his left arm significantly above his baseline.  Denies recent fevers or chills.  No recent head trauma.  He has been taking his anticoagulation as prescribed.  No vision changes.  No speech difficulty today.     Physical Exam   Triage Vital Signs: ED Triage Vitals  Enc Vitals Group     BP 03/10/22 1631 (!) 141/90     Pulse Rate 03/10/22 1631 78     Resp 03/10/22 1631 18     Temp 03/10/22 1631 98.5 F (36.9 C)     Temp src --      SpO2 03/10/22 1631 95 %     Weight 03/10/22 1654 235 lb 0.2 oz (106.6 kg)     Height 03/10/22 1654 '6\' 1"'$  (1.854 m)     Head Circumference --      Peak Flow --      Pain Score 03/10/22 1654 0     Pain Loc --      Pain Edu? --      Excl. in [place]? --     Most recent vital signs: Vitals:   03/10/22 1900 03/10/22 2130  BP: (!) 155/91 (!) 141/87  Pulse: 74 68  Resp: 17 16  Temp:    SpO2: 98% 98%     General: Awake, no distress.  CV:  Good peripheral perfusion.  Regular rate and rhythm.  No murmurs. Resp:  Normal effort.  Lungs clear to auscultation bilaterally. Abd:  No distention.  Other: Cranial nerves II through XII intact.  Strength 4 out of 5 left upper and lower  extremity.  5 and 5 throughout right.  Slightly diminished sensation to light touch throughout the left hemibody.  Pronator drift noted on the left.  Gait deferred.   ED Results / Procedures / Treatments   Labs (all labs ordered are listed, but only abnormal results are displayed) Labs Reviewed  PROTIME-INR - Abnormal; Notable for the following components:      Result Value   Prothrombin Time 17.3 (*)    INR 1.4 (*)    All other components within normal limits  APTT - Abnormal; Notable for the following components:   aPTT 40 (*)    All other components within normal limits  DIFFERENTIAL - Abnormal; Notable for the following components:   Eosinophils Absolute 0.7 (*)    All other components within normal limits  COMPREHENSIVE METABOLIC PANEL - Abnormal; Notable for the following components:   Glucose, Bld 108 (*)    Calcium 8.7 (*)    Alkaline Phosphatase 136 (*)    All other components within normal limits  CBG MONITORING, ED - Abnormal; Notable  for the following components:   Glucose-Capillary 113 (*)    All other components within normal limits  CBC  ETHANOL  HEMOGLOBIN A1C  LIPID PANEL  URINE DRUG SCREEN, QUALITATIVE ([facility] ONLY)  HIV ANTIBODY (ROUTINE TESTING W REFLEX)     EKG Normal sinus rhythm, ventricular rate 76.  PR 192, QRS 112, QTc 446.  No acute ST elevations or depressions.  EKG evidence of acute ischemia or infarct.   RADIOLOGY CT head: No acute intracranial abnormality, chronic lacunar infarct in the right thalamus CT angio: No emergent large vessel occlusion proximal hemodynamically significant stenosis  I also independently reviewed and agree with radiologist interpretations.   PROCEDURES:  Critical Care performed: No  .1-3 Lead EKG Interpretation  Performed by: Duffy Bruce, MD Authorized by: Duffy Bruce, MD     Interpretation: normal     ECG rate:  70-80   ECG rate assessment: normal     Rhythm: sinus rhythm     Ectopy: none      Conduction: normal   Comments:     Indication: Stroke like sx     MEDICATIONS ORDERED IN ED: Medications  iohexol (OMNIPAQUE) 350 MG/ML injection 100 mL (has no administration in time range)  atorvastatin (LIPITOR) tablet 40 mg (has no administration in time range)   stroke: early stages of recovery book ( Does not apply Not Given 03/10/22 1851)  acetaminophen (TYLENOL) tablet 650 mg (has no administration in time range)    Or  acetaminophen (TYLENOL) 160 MG/5ML solution 650 mg (has no administration in time range)    Or  acetaminophen (TYLENOL) suppository 650 mg (has no administration in time range)  senna-docusate (Senokot-S) tablet 1 tablet (has no administration in time range)  labetalol (NORMODYNE) injection 5 mg (has no administration in time range)  hydrALAZINE (APRESOLINE) injection 5 mg (has no administration in time range)  heparin injection 5,000 Units (has no administration in time range)  aspirin EC tablet 325 mg (has no administration in time range)  flecainide (TAMBOCOR) tablet 50 mg (has no administration in time range)  nortriptyline (PAMELOR) capsule 40 mg (has no administration in time range)  fluticasone (FLONASE) 50 MCG/ACT nasal spray 1 spray (has no administration in time range)  gabapentin (NEURONTIN) capsule 300 mg (has no administration in time range)  gabapentin (NEURONTIN) capsule 900 mg (has no administration in time range)  sodium chloride flush (NS) 0.9 % injection 3 mL (3 mLs Intravenous Given 03/10/22 1710)  iohexol (OMNIPAQUE) 350 MG/ML injection 100 mL (100 mLs Intravenous Contrast Given 03/10/22 1654)  LORazepam (ATIVAN) injection 1 mg (1 mg Intravenous Given 03/10/22 1947)  aspirin EC tablet 325 mg (325 mg Oral Given 03/10/22 2128)    Or  aspirin suppository 300 mg ( Rectal See Alternative 03/10/22 2128)     IMPRESSION / MDM / ASSESSMENT AND PLAN / ED COURSE  I reviewed the triage vital signs and the nursing notes.                                The patient is on the cardiac monitor to evaluate for evidence of arrhythmia and/or significant heart rate changes.   Ddx:  Differential includes the following, with pertinent life- or limb-threatening emergencies considered:  Acute stroke, recrudescence of old stroke in the setting of possible medical illness, unlikely cervical or spinal etiology, unlikely peripheral neuropathy given unilateral symptoms.  Patient's presentation is most consistent with acute presentation with  potential threat to life or bodily function.  MDM:  56 yo M with h/o CVA here with acute L sided weakness. Suspect acute CVA, less likely recrudescence of old stroke as this is in the same distribution. CT head is negative. He is outside the window of tPA. CT Angio is negative. He was initially activated as a code stroke but is not eligible for tPA as he is on anticoagulation and now outside window with no signs of LVO. Will admit to medicine.  Labs overall reassuring. CBC without leukocytosis or anemia. CMP unremarkable. CT Angio shows no LVO. CT Head negative.   MEDICATIONS GIVEN IN ED: Medications  iohexol (OMNIPAQUE) 350 MG/ML injection 100 mL (has no administration in time range)  atorvastatin (LIPITOR) tablet 40 mg (has no administration in time range)   stroke: early stages of recovery book ( Does not apply Not Given 03/10/22 1851)  acetaminophen (TYLENOL) tablet 650 mg (has no administration in time range)    Or  acetaminophen (TYLENOL) 160 MG/5ML solution 650 mg (has no administration in time range)    Or  acetaminophen (TYLENOL) suppository 650 mg (has no administration in time range)  senna-docusate (Senokot-S) tablet 1 tablet (has no administration in time range)  labetalol (NORMODYNE) injection 5 mg (has no administration in time range)  hydrALAZINE (APRESOLINE) injection 5 mg (has no administration in time range)  heparin injection 5,000 Units (has no administration in time range)  aspirin EC  tablet 325 mg (has no administration in time range)  flecainide (TAMBOCOR) tablet 50 mg (has no administration in time range)  nortriptyline (PAMELOR) capsule 40 mg (has no administration in time range)  fluticasone (FLONASE) 50 MCG/ACT nasal spray 1 spray (has no administration in time range)  gabapentin (NEURONTIN) capsule 300 mg (has no administration in time range)  gabapentin (NEURONTIN) capsule 900 mg (has no administration in time range)  sodium chloride flush (NS) 0.9 % injection 3 mL (3 mLs Intravenous Given 03/10/22 1710)  iohexol (OMNIPAQUE) 350 MG/ML injection 100 mL (100 mLs Intravenous Contrast Given 03/10/22 1654)  LORazepam (ATIVAN) injection 1 mg (1 mg Intravenous Given 03/10/22 1947)  aspirin EC tablet 325 mg (325 mg Oral Given 03/10/22 2128)    Or  aspirin suppository 300 mg ( Rectal See Alternative 03/10/22 2128)     Consults:  Neurology as code stroke Hospitalist   EMR reviewed  Reviewed old imaging from stroke     FINAL CLINICAL IMPRESSION(S) / ED DIAGNOSES   Final diagnoses:  Acute stroke due to ischemia [facility])     Rx / DC Orders   ED Discharge Orders     None        Note:  This document was prepared using Dragon voice recognition software and may include unintentional dictation errors.   Duffy Bruce, MD 03/10/22 2245

## 2022-03-10 NOTE — ED Notes (Signed)
Activated Code Stroke w/[facility]

## 2022-03-10 NOTE — Assessment & Plan Note (Addendum)
-   Atorvastatin 40 mg daily

## 2022-03-10 NOTE — ED Notes (Signed)
Pt in CT at this time with RN [facility]

## 2022-03-10 NOTE — ED Triage Notes (Signed)
C?O left sided numbness and weakness to left side of body.  Noticed symptoms at 1300.  C/O decrease dexterity to left hand.   Patient gait unsteady on arrival.  Placed in wheelchair for safety.

## 2022-03-10 NOTE — Hospital Course (Addendum)
[name] is a 56 year old male with history of lacunar infarct with left upper extremity weakness and baseline difficulty walking, hypertension, currently on Xarelto, neuropathy, hyperlipidemia chronic back pain neck pain with numbness and tingling who presented to the ED for evaluation of acute onset  left-sided weakness.  He was admitted for stroke evaluation and MRI brain confirmed a 2 cm acute infarct involving the right frontal centrum semi ovale.  CTA head and neck was negative for large vessel occlusions or other significant proximal stenosis.  Neurology following. PT/OT recommend acute inpatient rehab.

## 2022-03-10 NOTE — Progress Notes (Signed)
TELESTROKE NOTE    1636: Call received from Dr. Erlinda Hong at this time. Code stroke called at facility but code stroke cart not activated. Dr. Erlinda Hong requested telestroke RN to log onto cart 1. This RN logged onto cart 1 at this time. Dr. Erlinda Hong on camera. Patient already in CT. Patient complaining of left arm and leg weakness and numbness along with an unsteady gait. Dr. Erlinda Hong assessing patient at this time. FANG-D + for left sided hemiparesis. Patient does have a history of a prior CVA, but states weakness and numbness is worse and not at baseline. mRS 1. LKW 1300. Patient takes Lennette Bihari for history of Afib.    Berenice Bouton Telestroke RN

## 2022-03-10 NOTE — Consult Note (Signed)
[facility] Telemedicine Consult   Requesting Provider: Dr. Lysbeth Galas  Chief Complaint:  Worsening left sided numbness and weakness  HPI:  56 yo M with history of hypertension, stroke in 2021, A-fib on Xarelto presented to ER for worsening left-sided weakness and numbness.  Patient had stroke in 12/2019 admitted to [facility] MRI showed right thalamic infarct, MRA head and neck unremarkable, carotid Doppler unremarkable.  EF 60 to 65%.  Discharged on aspirin.  He was put on loop recorder in 01/2020 and apparently found to have A-fib with prolonged Xarelto.  Today around 1 PM he was finishing lunch and had sudden onset worsening left-sided weakness and numbness, especially left hand dexterity difficulty and left leg walking difficulty.  Denies any vision or speech changes.  EMS was called.  BP 141/90, glucose 113.  CT no acute abnormality but chronic right thalamic infarct.  CTA head and neck negative.  Patient took Xarelto last night.  LKW: 1 PM tpa given?: No, on Xarelto IR Thrombectomy? No, no LVO Modified Rankin Scale: 2-Slight disability-UNABLE to perform all activities but does not need assistance   Exam: Vitals:   03/10/22 1631  BP: (!) 141/90  Pulse: 78  Resp: 18  Temp: 98.5 F (36.9 C)  SpO2: 95%     Temp:  [98.5 F (36.9 C)] 98.5 F (36.9 C) (11/02 1631) Pulse Rate:  [78] 78 (11/02 1631) Resp:  [18] 18 (11/02 1631) BP: (141)/(90) 141/90 (11/02 1631) SpO2:  [95 %] 95 % (11/02 1631) Weight:  [106.6 kg] 106.6 kg (11/02 1654)  General - Well nourished, well developed, in no apparent distress.  Ophthalmologic - fundi not visualized due to noncooperation.  Cardiovascular - Regular rhythm and rate.  Neuro - awake, alert, eyes open, orientated to age, place, time. No aphasia, fluent language, following all simple commands. Able to name and repeat. No gaze palsy, tracking bilaterally, visual field full. No facial droop. Tongue midline. BUEs no drift but left hand grip  decrease. LLE drift but not hitting bed in 5 sec. RLE 5/5. Sensation decreased on the left UE and LE, L FTN ataxic, gait not tested.  Marland Kitchen   NIH Stroke Scale  Level Of Consciousness 0=Alert; keenly responsive 1=Arouse to minor stimulation 2=Requires repeated stimulation to arouse or movements to pain 3=postures or unresponsive 0  LOC Questions to Month and Age 33=Answers both questions correctly 1=Answers one question correctly or dysarthria/intubated/trauma/language barrier 2=Answers neither question correctly or aphasia 0  LOC Commands      -Open/Close eyes     -Open/close grip     -Pantomime commands if communication barrier 0=Performs both tasks correctly 1=Performs one task correctly 2=Performs neighter task correctly 0  Best Gaze     -Only assess horizontal gaze 0=Normal 1=Partial gaze palsy 2=Forced deviation, or total gaze paresis 0  Visual 0=No visual loss 1=Partial hemianopia 2=Complete hemianopia 3=Bilateral hemianopia (blind including cortical blindness) 0  Facial Palsy     -Use grimace if obtunded 0=Normal symmetrical movement 1=Minor paralysis (asymmetry) 2=Partial paralysis (lower face) 3=Complete paralysis (upper and lower face) 0  Motor  0=No drift for 10/5 seconds 1=Drift, but does not hit bed 2=Some antigravity effort, hits  bed 3=No effort against gravity, limb falls 4=No movement 0=Amputation/joint fusion Right Arm 0     Leg 0    Left Arm 0     Leg 1  Limb Ataxia     - FNT/HTS 0=Absent or does not understand or paralyzed or amputation/joint fusion 1=Present in one limb 2=Present in  two limbs 1  Sensory 0=Normal 1=Mild to moderate sensory loss 2=Severe to total sensory loss or coma/unresponsive 1  Best Language 0=No aphasia, normal 1=Mild to moderate aphasia 2=Severe aphasia 3=Mute, global aphasia, or coma/unresponsive 0  Dysarthria 0=Normal 1=Mild to moderate 2=Severe, unintelligible or mute/anarthric 0=intubated/unable to test 0   Extinction/Neglect 0=No abnormality 1=visual/tactile/auditory/spatia/personal inattention/Extinction to bilateral simultaneous stimulation 2=Profound neglect/extinction more than 1 modality  0  Total   3      Imaging Reviewed:  CT ANGIO HEAD NECK W WO CM (CODE STROKE)  Result Date: 03/10/2022 CLINICAL DATA:  Stroke, follow up EXAM: CT ANGIOGRAPHY HEAD AND NECK TECHNIQUE: Multidetector CT imaging of the head and neck was performed using the standard protocol during bolus administration of intravenous contrast. Multiplanar CT image reconstructions and MIPs were obtained to evaluate the vascular anatomy. Carotid stenosis measurements (when applicable) are obtained utilizing NASCET criteria, using the distal internal carotid diameter as the denominator. RADIATION DOSE REDUCTION: This exam was performed according to the departmental dose-optimization program which includes automated exposure control, adjustment of the mA and/or kV according to patient size and/or use of iterative reconstruction technique. CONTRAST:  148m OMNIPAQUE IOHEXOL 350 MG/ML SOLN COMPARISON:  CT head from the same day. FINDINGS: CTA NECK FINDINGS Aortic arch: Great vessel origins are patent. Right carotid system: No evidence of dissection, stenosis (50% or greater), or occlusion. Left carotid system: No evidence of dissection, stenosis (50% or greater), or occlusion. Vertebral arteries: Codominant. No evidence of dissection, stenosis (50% or greater), or occlusion. Skeleton: No acute findings. Other neck: No acute findings. Upper chest: Visualized lung apices are clear. Review of the MIP images confirms the above findings CTA HEAD FINDINGS Anterior circulation: Bilateral intracranial ICAs, MCAs, and ACAs are patent without proximal hemodynamically significant stenosis. Posterior circulation: Bilateral intradural vertebral arteries and basilar artery and bilateral posterior cerebral arteries are patent without proximal  hemodynamically significant stenosis. Venous sinuses: As permitted by contrast timing, patent. Review of the MIP images confirms the above findings IMPRESSION: No emergent large vessel occlusion or proximal hemodynamically significant stenosis. Electronically Signed   By: FMargaretha SheffieldM.D.   On: 03/10/2022 17:13   CT HEAD CODE STROKE WO CONTRAST  Result Date: 03/10/2022 CLINICAL DATA:  Code stroke. Neuro deficit, acute, stroke suspected. Left-sided numbness and weakness. EXAM: CT HEAD WITHOUT CONTRAST TECHNIQUE: Contiguous axial images were obtained from the base of the skull through the vertex without intravenous contrast. RADIATION DOSE REDUCTION: This exam was performed according to the departmental dose-optimization program which includes automated exposure control, adjustment of the mA and/or kV according to patient size and/or use of iterative reconstruction technique. COMPARISON:  Head CT 12/24/2019 and MRI 12/25/2019 FINDINGS: Brain: There is no evidence of an acute infarct, intracranial hemorrhage, mass, midline shift, or extra-axial fluid collection. Patchy hypodensities in the cerebral white matter bilaterally are similar to the prior CT. A chronic lacunar infarct in the right thalamus was acute in 2021. The ventricles are normal in size. Vascular: Calcified atherosclerosis at the skull base. No hyperdense vessel. Skull: No fracture or suspicious osseous lesion. Sinuses/Orbits: Mild left ethmoid air cell mucosal thickening. Clear mastoid air cells. Unremarkable orbits. Other: None. ASPECTS (ALa JaraStroke Program Early CT Score) - Ganglionic level infarction (caudate, lentiform nuclei, internal capsule, insula, M1-M3 cortex): 7 - Supraganglionic infarction (M4-M6 cortex): 3 Total score (0-10 with 10 being normal): 10 These results were communicated to Dr. KLeonel Ramsayat 4:50 pm on 03/10/2022 by text page via the [facility] system. IMPRESSION: 1. No  evidence of acute intracranial abnormality.  ASPECTS of 10. 2. 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 3. Chronic lacunar infarct in the right thalamus. Electronically Signed   By: Logan Bores M.D.   On: 03/10/2022 16:50     Labs reviewed in epic and pertinent values follow: Creatinine 1.10, platelet 163  Assessment:  56 yo M with history of hypertension, stroke in 2021, A-fib on Xarelto presented to ER for worsening left-sided weakness and numbness.  Lasting well 1 PM, NIH score 3.  CT no acute abnormality but chronic right thalamic infarct.  CTA head and neck negative.  Patient took Xarelto last night.  Patient not tPA candidate given on Xarelto, not IR candidate given no LVO.  Etiology for patient symptoms concerning for small vessel disease.  Will need to further stroke work-up with MRI, 2D echo.  Check LDL and A1c.  Will give aspirin tonight but hold off Xarelto until MRI done.   Recommendations:  Admission with hospitalist service and continue further stroke work up  Frequent neuro checks Telemetry monitoring MRI brain  Echocardiogram  UDS, fasting lipid panel and HgbA1C PT/OT/speech consult Permissive hypertension (only treat if BP > 180/105 given on Xarelto) for 24-48 hours post stroke onset GI and DVT prophylaxis  Will give ASA 325 tonight, will hold off Xarelto tonight until MRI done showed no large infarct Stroke risk factor modification Further evaluation as per inpatient neurology recommendations  Discussed with Dr. Lysbeth Galas ED physician We will follow    Consult Participants: RN, patient, me and stroke response nurse Location of the provider: [facility] Location of the patient: [facility] ED  Time Code Stroke Page received: 4:32 PM Time neurologist arrived: 4:35 PM Time NIHSS completed: 4:51 PM  This consult was provided via telemedicine with 2-way video and audio communication. The patient/family was informed that care would be provided in this way and agreed to receive care in this manner.    This patient is receiving care for possible acute neurological changes. There was 50 minutes of care by this provider at the time of service, including time for direct evaluation via telemedicine, review of medical records, imaging studies and discussion of findings with providers, the patient and/or family.  Rosalin Hawking, MD PhD Stroke Neurology 03/10/2022 5:12 PM

## 2022-03-10 NOTE — Assessment & Plan Note (Addendum)
Home regimen: Metoprolol succinate 50 mg daily, HCTZ 25 mg daily -held for permissive hypertension IV labetalol PRN SBP>180

## 2022-03-10 NOTE — H&P (Signed)
History and Physical   Carlos Nolan JKK:[id number] DOB: May 16, 1965 DOA: 03/10/2022  PCP: [facility]  Outpatient Specialists: Dr. Melrose Nakayama, neurology Patient coming from: work  I have personally briefly reviewed patient's old medical records in [place].  Chief Concern: Left-sided weakness  HPI: [name] is a 56 year old male with history of lacunar infarct with left upper extremity weakness and baseline difficulty walking, hypertension, currently on Xarelto, neuropathy, hyperlipidemia chronic back pain neck pain with numbness and tingling who presents emergency department for chief concerns of left-sided weakness.  Initial vitals in the emergency department showed temperature of 98.5, respiration rate of 18, heart rate of 141, blood pressure 141/90, SPO2 of 95% on room air.  Serum sodium is 138, potassium 2.9, chloride 104, bicarb 27, BUN of 15, serum creatinine of 1.10, GFR greater than 60, nonfasting blood glucose 108, WBC 6.3, hemoglobin 14.1, platelets of 163.  CT of the head without contrast: Was read as no acute intracranial abnormality.  Chronic lacunar infarct in the right thalamus.  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  CTA of the head and neck with and without contrast: No emergent large vessel occlusion or proximal hemodynamically significant stenosis.  ED treatment aspirin 325 mg p.o. one-time dose ---------------------------- At bedside, he was using his cell phone when I entered the room. He was able to tell me his name, age, current location [facility], and identifiy his wife at beside.   He reports that at approximately 1 PM on day of admission he developed left-sided weakness and difficulty walking.  They deny slurring of speech and difficulty speaking.  Patient reports that he was at work when these happen.  He states that the symptoms are still persistent since 1 PM.  He states they did not get better.  He  denies chest pain, shortness of breath, known fever, cough, chills, nausea, vomiting, dysuria, hematuria, diarrhea, swelling of his lower extremities.  Social history: He lives at home with his wife.  He denies tobacco, EtOH, recreational drug use.  He is currently working as a lead in Nurse, mental health.   ROS: Constitutional: no weight change, no fever ENT/Mouth: no sore throat, no rhinorrhea Eyes: no eye pain, no vision changes Cardiovascular: no chest pain, no dyspnea,  no edema, no palpitations Respiratory: no cough, no sputum, no wheezing Gastrointestinal: no nausea, no vomiting, no diarrhea, no constipation Genitourinary: no urinary incontinence, no dysuria, no hematuria Musculoskeletal: no arthralgias, no myalgias Skin: no skin lesions, no pruritus, Neuro: + Worsening left-sided weakness, no loss of consciousness, no syncope Psych: no anxiety, no depression, + decrease appetite Heme/Lymph: no bruising, no bleeding  ED Course: Discussed with emergency medicine provider, patient requiring hospitalization for chief concerns of stroke.  Assessment/Plan  Principal Problem:   Left-sided weakness Active Problems:   S/P ablation of atrial fibrillation   Essential hypertension   Hyperlipidemia   Atrial fibrillation, chronic (HCC)   Assessment and Plan:  * Left-sided weakness Stroke-like symptoms - MRI of the brain is pending - Ativan ordered by EDP for MRI - Neurology has been consulted and we appreciate further recommendations - Complete echo - Fasting lipid and A1c ordered - Permissive hypertension per neurology recommendations - Frequent neuro vascular checks - N.p.o. pending swallow study - Fall precaution  Atrial fibrillation, chronic ([place]) - Patient is on flecainide 50 mg p.o. twice daily, this has been resumed - Holding Xarelto at the recommendation of neurologist  Hyperlipidemia - Atorvastatin 40 mg daily resumed  Essential hypertension -  Patient takes metoprolol succinate 50 mg daily, hydrochlorothiazide 25 mg daily -These have not been resumed on admission - Labetalol 5 mg IV every 2 hours as needed for SBP during 180, 3 doses ordered; hydralazine 5 mg IV every 6 hours as needed for SBP greater than 180, starting at 03/28/2022 at midnight for 40 hours  S/P ablation of atrial fibrillation - Patient is on Xarelto, per neurology recommends holding Xarelto for tonight  Chart reviewed.   DVT prophylaxis: Heparin 5000 units subcutaneous every 8 hours Code Status: Full code Diet: Heart healthy Family Communication: Updated spouse, Pamala Hurry at bedside with patient's permission Disposition Plan: Pending clinical course Consults called: Neurology Admission status: Telemetry medical, observation  Past Medical History:  Diagnosis Date   Atrial fibrillation [facility])    COVID-19 01/2019   Sept 2020   Past Surgical History:  Procedure Laterality Date   APPENDECTOMY     CARDIAC ELECTROPHYSIOLOGY STUDY AND ABLATION  04/24/2015   COLONOSCOPY WITH PROPOFOL N/A 08/11/2020   Procedure: COLONOSCOPY WITH PROPOFOL;  Surgeon: Wilford Corner, MD;  Location: [facility];  Service: Endoscopy;  Laterality: N/A;   ELBOW FRACTURE SURGERY     ESOPHAGOGASTRODUODENOSCOPY N/A 01/11/2016   Procedure: ESOPHAGOGASTRODUODENOSCOPY (EGD);  Surgeon: Mauri Pole, MD;  Location: [facility];  Service: Endoscopy;  Laterality: N/A;   FEMUR FRACTURE SURGERY     HARDWARE REMOVAL Left 08/22/2019   Procedure: HARDWARE REMOVAL FROM LEFT ELBOW;  Surgeon: Corky Mull, MD;  Location: [facility]S;  Service: Orthopedics;  Laterality: Left;   LOOP RECORDER INSERTION N/A 01/21/2020   Procedure: LOOP RECORDER INSERTION;  Surgeon: Isaias Cowman, MD;  Location: [facility];  Service: Cardiovascular;  Laterality: N/A;   POLYPECTOMY  08/11/2020   Procedure: POLYPECTOMY;  Surgeon: Wilford Corner, MD;  Location: [facility];  Service: Endoscopy;;    SHOULDER ARTHROSCOPY Right 06/15/2016   Procedure: Arthroscopic labral debridement, arthroscopic subacromial decompression, and mini-open bursectomy with exploration of rotator cuff, right shoulder;  Surgeon: Corky Mull, MD;  Location: [place];  Service: Orthopedics;  Laterality: Right;   Social History:  reports that he has never smoked. He has never used smokeless tobacco. He reports current alcohol use of about 2.0 standard drinks of alcohol per week. He reports that he does not use drugs.  Allergies  Allergen Reactions   Banana Anaphylaxis   Penicillins Hives and Rash    Has patient had a PCN reaction causing immediate rash, facial/tongue/throat swelling, SOB or lightheadedness with hypotension:unsure Has patient had a PCN reaction causing severe rash involving mucus membranes or skin necrosis:No Has patient had a PCN reaction that required hospitalization:No Has patient had a PCN reaction occurring within the last 10 years:No If all of the above answers are "NO", then may proceed with Cephalosporin use.    Quinidine Rash   Family History  Problem Relation Age of Onset   Hypertension Mother    Stroke Mother    Dementia Mother    Heart failure Father    Diabetes Father    Family history: Family history reviewed and not pertinent  Prior to Admission medications   Medication Sig Start Date End Date Taking? Authorizing Provider  atorvastatin (LIPITOR) 40 MG tablet Take 1 tablet (40 mg total) by mouth daily at 6 PM. 12/26/19 03/10/22 Yes Enzo Bi, MD  flecainide (TAMBOCOR) 50 MG tablet Take 1 tablet by mouth 2 (two) times daily. 11/04/21 11/04/22 Yes [provider]  gabapentin (NEURONTIN) 300 MG capsule  Take 300-600 mg by mouth See admin instructions. Take 300 mg by mouth in the morning and 900 mg at night 06/26/20  Yes [provider]  hydrochlorothiazide (HYDRODIURIL) 25 MG tablet Take 1 tablet (25 mg total) by mouth daily. 12/24/19  Yes Paduchowski, Lennette Bihari,  MD  magnesium oxide (MAG-OX) 400 MG tablet Take 400 mg by mouth daily.   Yes [provider]  metoprolol succinate (TOPROL-XL) 50 MG 24 hr tablet Take 50 mg by mouth daily. 07/27/20  Yes [provider]  Multiple Vitamins-Minerals (MULTIVITAMIN WITH MINERALS) tablet Take 1 tablet by mouth daily.   Yes [provider]  nortriptyline (PAMELOR) 10 MG capsule Take 40 mg by mouth at bedtime. 07/22/20  Yes [provider]  Omega-3 Fatty Acids (FISH OIL) 1200 MG CAPS Take 1,200 mg by mouth daily.   Yes [provider]  TURMERIC PO Take 1,000 mg by mouth daily.   Yes [provider]  XARELTO 20 MG TABS tablet Take 20 mg by mouth daily. 07/29/20  Yes [provider]  aspirin EC 325 MG EC tablet Take 1 tablet (325 mg total) by mouth daily. Patient not taking: Reported on 07/31/2020 12/27/19   Enzo Bi, MD  fluticasone [facility]) 50 MCG/ACT nasal spray Place 1 spray into both nostrils daily as needed for allergies or rhinitis.    [provider]   Physical Exam: Vitals:   03/10/22 1730 03/10/22 1800 03/10/22 1830 03/10/22 1900  BP: (!) 152/87 (!) 149/96 139/79 (!) 155/91  Pulse: 73 75 70 74  Resp: '13 20 17 17  '$ Temp:      SpO2: 99% 97% 98% 98%  Weight:      Height:       Constitutional: appears age-appropriate, NAD, calm, comfortable Eyes: PERRL, lids and conjunctivae normal ENMT: Mucous membranes are moist. Posterior pharynx clear of any exudate or lesions. Age-appropriate dentition. Hearing appropriate Neck: normal, supple, no masses, no thyromegaly Respiratory: clear to auscultation bilaterally, no wheezing, no crackles. Normal respiratory effort. No accessory muscle use.  Cardiovascular: Regular rate and rhythm, no murmurs / rubs / gallops. No extremity edema. 2+ pedal pulses. No carotid bruits.  Abdomen: Obese abdomen, no tenderness, no masses palpated, no hepatosplenomegaly. Bowel sounds positive.  Musculoskeletal: no clubbing  / cyanosis. No joint deformity upper and lower extremities. Good ROM, no contractures, no atrophy. Normal muscle tone.  Skin: no rashes, lesions, ulcers. No induration Neurologic: Sensation intact. Strength 5/5 in the right upper and lower extremities.  Significant weakness in the left upper and lower extremities. Psychiatric: Normal judgment and insight. Alert and oriented x 3. Normal mood.   EKG: independently reviewed, showing sinus rhythm with rate of 76, QTc 446  Chest x-ray on Admission: I personally reviewed and I agree with radiologist reading as below.  CT ANGIO HEAD NECK W WO CM (CODE STROKE)  Result Date: 03/10/2022 CLINICAL DATA:  Stroke, follow up EXAM: CT ANGIOGRAPHY HEAD AND NECK TECHNIQUE: Multidetector CT imaging of the head and neck was performed using the standard protocol during bolus administration of intravenous contrast. Multiplanar CT image reconstructions and MIPs were obtained to evaluate the vascular anatomy. Carotid stenosis measurements (when applicable) are obtained utilizing NASCET criteria, using the distal internal carotid diameter as the denominator. RADIATION DOSE REDUCTION: This exam was performed according to the departmental dose-optimization program which includes automated exposure control, adjustment of the mA and/or kV according to patient size and/or use of iterative reconstruction technique. CONTRAST:  157m OMNIPAQUE IOHEXOL 350 MG/ML SOLN  COMPARISON:  CT head from the same day. FINDINGS: CTA NECK FINDINGS Aortic arch: Great vessel origins are patent. Right carotid system: No evidence of dissection, stenosis (50% or greater), or occlusion. Left carotid system: No evidence of dissection, stenosis (50% or greater), or occlusion. Vertebral arteries: Codominant. No evidence of dissection, stenosis (50% or greater), or occlusion. Skeleton: No acute findings. Other neck: No acute findings. Upper chest: Visualized lung apices are clear. Review of the MIP images  confirms the above findings CTA HEAD FINDINGS Anterior circulation: Bilateral intracranial ICAs, MCAs, and ACAs are patent without proximal hemodynamically significant stenosis. Posterior circulation: Bilateral intradural vertebral arteries and basilar artery and bilateral posterior cerebral arteries are patent without proximal hemodynamically significant stenosis. Venous sinuses: As permitted by contrast timing, patent. Review of the MIP images confirms the above findings IMPRESSION: No emergent large vessel occlusion or proximal hemodynamically significant stenosis. Electronically Signed   By: Margaretha Sheffield M.D.   On: 03/10/2022 17:13   CT HEAD CODE STROKE WO CONTRAST  Result Date: 03/10/2022 CLINICAL DATA:  Code stroke. Neuro deficit, acute, stroke suspected. Left-sided numbness and weakness. EXAM: CT HEAD WITHOUT CONTRAST TECHNIQUE: Contiguous axial images were obtained from the base of the skull through the vertex without intravenous contrast. RADIATION DOSE REDUCTION: This exam was performed according to the departmental dose-optimization program which includes automated exposure control, adjustment of the mA and/or kV according to patient size and/or use of iterative reconstruction technique. COMPARISON:  Head CT 12/24/2019 and MRI 12/25/2019 FINDINGS: Brain: There is no evidence of an acute infarct, intracranial hemorrhage, mass, midline shift, or extra-axial fluid collection. Patchy hypodensities in the cerebral white matter bilaterally are similar to the prior CT. A chronic lacunar infarct in the right thalamus was acute in 2021. The ventricles are normal in size. Vascular: Calcified atherosclerosis at the skull base. No hyperdense vessel. Skull: No fracture or suspicious osseous lesion. Sinuses/Orbits: Mild left ethmoid air cell mucosal thickening. Clear mastoid air cells. Unremarkable orbits. Other: None. ASPECTS ([place] Stroke Program Early CT Score) - Ganglionic level infarction (caudate,  lentiform nuclei, internal capsule, insula, M1-M3 cortex): 7 - Supraganglionic infarction (M4-M6 cortex): 3 Total score (0-10 with 10 being normal): 10 These results were communicated to Dr. Leonel Ramsay at 4:50 pm on 03/10/2022 by text page via the [facility] messaging system. IMPRESSION: 1. No evidence of acute intracranial abnormality. ASPECTS of 10. 2. 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 3. Chronic lacunar infarct in the right thalamus. Electronically Signed   By: Logan Bores M.D.   On: 03/10/2022 16:50    Labs on Admission: I have personally reviewed following labs  CBC: Recent Labs  Lab 03/10/22 1720  WBC 6.3  NEUTROABS 3.2  HGB 14.1  HCT 40.1  MCV 90.5  PLT 157   Basic Metabolic Panel: Recent Labs  Lab 03/10/22 1720  NA 138  K 3.9  CL 104  CO2 27  GLUCOSE 108*  BUN 15  CREATININE 1.10  CALCIUM 8.7*   GFR: Estimated Creatinine Clearance: 93.8 mL/min (by C-G formula based on SCr of 1.1 mg/dL).  Liver Function Tests: Recent Labs  Lab 03/10/22 1720  AST 33  ALT 37  ALKPHOS 136*  BILITOT 0.7  PROT 7.2  ALBUMIN 3.7   Coagulation Profile: Recent Labs  Lab 03/10/22 1720  INR 1.4*   CBG: Recent Labs  Lab 03/10/22 1631  GLUCAP 113*   Urine analysis:    Component Value Date/Time   COLORURINE YELLOW (A) 12/25/2019 1419   APPEARANCEUR  CLEAR (A) 12/25/2019 1419   LABSPEC 1.017 12/25/2019 1419   PHURINE 5.0 12/25/2019 1419   GLUCOSEU NEGATIVE 12/25/2019 1419   HGBUR NEGATIVE 12/25/2019 1419   BILIRUBINUR NEGATIVE 12/25/2019 1419   KETONESUR NEGATIVE 12/25/2019 1419   PROTEINUR NEGATIVE 12/25/2019 1419   NITRITE NEGATIVE 12/25/2019 1419   LEUKOCYTESUR NEGATIVE 12/25/2019 1419   Dr. Tobie Poet [facility]  If 7PM-7AM, please contact overnight-coverage provider If 7AM-7PM, please contact day coverage provider [email or web address]  03/10/2022, 8:36 PM

## 2022-03-11 ENCOUNTER — Observation Stay
Admit: 2022-03-11 | Discharge: 2022-03-11 | Disposition: A | Discharge status: Home / Self Care | Payer: 59 | Attending: Internal Medicine | Admitting: Internal Medicine

## 2022-03-11 ENCOUNTER — Encounter: Payer: Self-pay | Admitting: Internal Medicine

## 2022-03-11 DIAGNOSIS — R531 Weakness: Secondary | ICD-10-CM | POA: Diagnosis not present

## 2022-03-11 DIAGNOSIS — I482 Chronic atrial fibrillation, unspecified: Secondary | ICD-10-CM | POA: Diagnosis not present

## 2022-03-11 DIAGNOSIS — I639 Cerebral infarction, unspecified: Secondary | ICD-10-CM

## 2022-03-11 DIAGNOSIS — I10 Essential (primary) hypertension: Secondary | ICD-10-CM

## 2022-03-11 LAB — HEMOGLOBIN A1C
Hgb A1c MFr Bld: 5.7 % — ABNORMAL HIGH (ref 4.8–5.6)
Mean Plasma Glucose: 116.89 mg/dL

## 2022-03-11 LAB — LIPID PANEL
Cholesterol: 119 mg/dL (ref 0–200)
HDL: 31 mg/dL — ABNORMAL LOW (ref >40)
LDL Cholesterol: 66 mg/dL (ref 0–99)
Total CHOL/HDL Ratio: 3.8 RATIO
Triglycerides: 112 mg/dL (ref <150)
VLDL: 22 mg/dL (ref 0–40)

## 2022-03-11 LAB — HIV ANTIBODY (ROUTINE TESTING W REFLEX): HIV Screen 4th Generation wRfx: NONREACTIVE

## 2022-03-11 MED ORDER — HEPARIN SODIUM (PORCINE) 5000 UNIT/ML IJ SOLN
5000.0000 [IU] | Freq: Three times a day (TID) | INTRAMUSCULAR | Status: AC
  Administered 2022-03-11: 5000 [IU] via SUBCUTANEOUS
  Filled 2022-03-11: qty 1

## 2022-03-11 MED ORDER — APIXABAN 5 MG PO TABS
5.0000 mg | ORAL_TABLET | Freq: Two times a day (BID) | ORAL | Status: DC
  Administered 2022-03-12: 5 mg via ORAL
  Filled 2022-03-11: qty 1

## 2022-03-11 NOTE — Consult Note (Addendum)
Physical Medicine and Rehabilitation Consult Reason for Consult: CVA Referring Physician: Nicole Kindred, DO   HPI: Carlos Nolan is a 56 y.o. male who presented with left sided weakness and was found to have a 2cm nonhemorrhagic right frontal CVA. Physical Medicine & Rehabilitation was consulted to assess candidacy for CIR. He and his wife ask what intensive rehabilitation entails and is eager to participate in CIR after discussion.   ROS: +left sided weakness Past Medical History:  Diagnosis Date   Atrial fibrillation [facility])    COVID-19 01/2019   Sept 2020   Past Surgical History:  Procedure Laterality Date   APPENDECTOMY     CARDIAC ELECTROPHYSIOLOGY STUDY AND ABLATION  04/24/2015   COLONOSCOPY WITH PROPOFOL N/A 08/11/2020   Procedure: COLONOSCOPY WITH PROPOFOL;  Surgeon: Wilford Corner, MD;  Location: [facility];  Service: Endoscopy;  Laterality: N/A;   ELBOW FRACTURE SURGERY     ESOPHAGOGASTRODUODENOSCOPY N/A 01/11/2016   Procedure: ESOPHAGOGASTRODUODENOSCOPY (EGD);  Surgeon: Mauri Pole, MD;  Location: [facility];  Service: Endoscopy;  Laterality: N/A;   FEMUR FRACTURE SURGERY     HARDWARE REMOVAL Left 08/22/2019   Procedure: HARDWARE REMOVAL FROM LEFT ELBOW;  Surgeon: Corky Mull, MD;  Location: [facility];  Service: Orthopedics;  Laterality: Left;   LOOP RECORDER INSERTION N/A 01/21/2020   Procedure: LOOP RECORDER INSERTION;  Surgeon: Isaias Cowman, MD;  Location: [place] [facility];  Service: Cardiovascular;  Laterality: N/A;   POLYPECTOMY  08/11/2020   Procedure: POLYPECTOMY;  Surgeon: Wilford Corner, MD;  Location: [facility];  Service: Endoscopy;;   SHOULDER ARTHROSCOPY Right 06/15/2016   Procedure: Arthroscopic labral debridement, arthroscopic subacromial decompression, and mini-open bursectomy with exploration of rotator cuff, right shoulder;  Surgeon: Corky Mull, MD;  Location: [place];  Service: Orthopedics;  Laterality:  Right;   Family History  Problem Relation Age of Onset   Hypertension Mother    Stroke Mother    Dementia Mother    Heart failure Father    Diabetes Father    Social History:  reports that he has never smoked. He has never used smokeless tobacco. He reports current alcohol use of about 2.0 standard drinks of alcohol per week. He reports that he does not use drugs. Allergies:  Allergies  Allergen Reactions   Banana Anaphylaxis   Penicillins Hives and Rash    Has patient had a PCN reaction causing immediate rash, facial/tongue/throat swelling, SOB or lightheadedness with hypotension:unsure Has patient had a PCN reaction causing severe rash involving mucus membranes or skin necrosis:No Has patient had a PCN reaction that required hospitalization:No Has patient had a PCN reaction occurring within the last 10 years:No If all of the above answers are "NO", then may proceed with Cephalosporin use.    Quinidine Rash   Medications Prior to Admission  Medication Sig Dispense Refill   atorvastatin (LIPITOR) 40 MG tablet Take 1 tablet (40 mg total) by mouth daily at 6 PM. 30 tablet 2   flecainide (TAMBOCOR) 50 MG tablet Take 1 tablet by mouth 2 (two) times daily.     gabapentin (NEURONTIN) 300 MG capsule Take 300-600 mg by mouth See admin instructions. Take 300 mg by mouth in the morning and 900 mg at night     hydrochlorothiazide (HYDRODIURIL) 25 MG tablet Take 1 tablet (25 mg total) by mouth daily. 30 tablet 2   magnesium oxide (MAG-OX) 400 MG tablet Take 400 mg by mouth daily.     metoprolol  succinate (TOPROL-XL) 50 MG 24 hr tablet Take 50 mg by mouth daily.     Multiple Vitamins-Minerals (MULTIVITAMIN WITH MINERALS) tablet Take 1 tablet by mouth daily.     nortriptyline (PAMELOR) 10 MG capsule Take 40 mg by mouth at bedtime.     Omega-3 Fatty Acids (FISH OIL) 1200 MG CAPS Take 1,200 mg by mouth daily.     TURMERIC PO Take 1,000 mg by mouth daily.     XARELTO 20 MG TABS tablet Take 20 mg  by mouth daily.     aspirin EC 325 MG EC tablet Take 1 tablet (325 mg total) by mouth daily. (Patient not taking: Reported on 07/31/2020) 30 tablet 0   fluticasone (FLONASE) 50 MCG/ACT nasal spray Place 1 spray into both nostrils daily as needed for allergies or rhinitis.      Home: Home Living Family/patient expects to be discharged to:: Private residence Living Arrangements: Spouse/significant other Available Help at Discharge: Family, Available 24 hours/day Type of Home: House Home Access: Stairs to enter [employer] of Steps: 4 Entrance Stairs-Rails: Right, Left, Can reach both Home Layout: Two level, Able to live on main level with bedroom/bathroom Bathroom Shower/Tub: Multimedia programmer: [place]: Cane - single point, Wheelchair - manual Additional Comments: Spouse retired and available 24/7  Functional History: Prior Function Prior Level of Function : Independent/Modified Independent Mobility Comments: Ind amb without AD community distances, 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, works a Network engineer job ADLs Comments: Ind with ADLs Functional Status:  Mobility: Bed Mobility Overal bed mobility: Modified Independent General bed mobility comments: Min extra time and effort and use of bed rail Transfers Overall transfer level: Needs assistance Equipment used: Rolling walker (2 wheels) Transfers: Sit to/from Stand Sit to Stand: Supervision, From elevated surface General transfer comment: Extra effort to come to standing but steady upon standing without LOB Ambulation/Gait Ambulation/Gait assistance: Min guard Gait Distance (Feet): 80 Feet Assistive device: Rolling walker (2 wheels) (hallway railing) Gait Pattern/deviations: Step-to pattern, Decreased stance time - left, Decreased step length - right, Narrow base of support General Gait Details: Mod verbal and visual cues for step-to sequencing to address LLE weakness both with a RW and with use of hallway hand  rail Gait velocity: decreased    ADL:    Cognition: Cognition Overall Cognitive Status: Within Functional Limits for tasks assessed Orientation Level: Oriented X4 Cognition Arousal/Alertness: Awake/alert Behavior During Therapy: WFL for tasks assessed/performed Overall Cognitive Status: Within Functional Limits for tasks assessed  Blood pressure (!) 146/90, pulse 82, temperature 98.3 F (36.8 C), resp. rate 16, height '6\' 1"'$  (1.854 m), weight 106.6 kg, SpO2 98 %. Physical Exam Gen: no distress, normal appearing, BMI 31.01 HEENT: oral mucosa pink and moist, NCAT Cardio: Reg rate Chest: normal effort, normal rate of breathing Abd: soft, non-distended Ext: no edema Psych: pleasant, normal affect Skin: intact Neuro: Alert and oriented x3 Musculoskeletal: 5/5 strength except for 4/5 LUE. Decreased sensation throughout left side  Results for orders placed or performed during the hospital encounter of 03/10/22 (from the past 24 hour(s))  CBG monitoring, ED     Status: Abnormal   Collection Time: 03/10/22  4:31 PM  Result Value Ref Range   Glucose-Capillary 113 (H) 70 - 99 mg/dL  Protime-INR     Status: Abnormal   Collection Time: 03/10/22  5:20 PM  Result Value Ref Range   Prothrombin Time 17.3 (H) 11.4 - 15.2 seconds   INR 1.4 (H) 0.8 - 1.2  APTT  Status: Abnormal   Collection Time: 03/10/22  5:20 PM  Result Value Ref Range   aPTT 40 (H) 24 - 36 seconds  CBC     Status: None   Collection Time: 03/10/22  5:20 PM  Result Value Ref Range   WBC 6.3 4.0 - 10.5 K/uL   RBC 4.43 4.22 - 5.81 MIL/uL   Hemoglobin 14.1 13.0 - 17.0 g/dL   HCT 40.1 39.0 - 52.0 %   MCV 90.5 80.0 - 100.0 fL   MCH 31.8 26.0 - 34.0 pg   MCHC 35.2 30.0 - 36.0 g/dL   RDW 11.9 11.5 - 15.5 %   Platelets 163 150 - 400 K/uL   nRBC 0.0 0.0 - 0.2 %  Differential     Status: Abnormal   Collection Time: 03/10/22  5:20 PM  Result Value Ref Range   Neutrophils Relative % 51 %   Neutro Abs 3.2 1.7 - 7.7  K/uL   Lymphocytes Relative 28 %   Lymphs Abs 1.7 0.7 - 4.0 K/uL   Monocytes Relative 10 %   Monocytes Absolute 0.6 0.1 - 1.0 K/uL   Eosinophils Relative 10 %   Eosinophils Absolute 0.7 (H) 0.0 - 0.5 K/uL   Basophils Relative 1 %   Basophils Absolute 0.1 0.0 - 0.1 K/uL   Immature Granulocytes 0 %   Abs Immature Granulocytes 0.01 0.00 - 0.07 K/uL  Comprehensive metabolic panel     Status: Abnormal   Collection Time: 03/10/22  5:20 PM  Result Value Ref Range   Sodium 138 135 - 145 mmol/L   Potassium 3.9 3.5 - 5.1 mmol/L   Chloride 104 98 - 111 mmol/L   CO2 27 22 - 32 mmol/L   Glucose, Bld 108 (H) 70 - 99 mg/dL   BUN 15 6 - 20 mg/dL   Creatinine, Ser 1.10 0.61 - 1.24 mg/dL   Calcium 8.7 (L) 8.9 - 10.3 mg/dL   Total Protein 7.2 6.5 - 8.1 g/dL   Albumin 3.7 3.5 - 5.0 g/dL   AST 33 15 - 41 U/L   ALT 37 0 - 44 U/L   Alkaline Phosphatase 136 (H) 38 - 126 U/L   Total Bilirubin 0.7 0.3 - 1.2 mg/dL   GFR, Estimated >60 >60 mL/min   Anion gap 7 5 - 15  Ethanol     Status: None   Collection Time: 03/10/22  5:20 PM  Result Value Ref Range   Alcohol, Ethyl (B) <10 <10 mg/dL  HIV Antibody (routine testing w rflx)     Status: None   Collection Time: 03/10/22 11:08 PM  Result Value Ref Range   HIV Screen 4th Generation wRfx Non Reactive Non Reactive  Hemoglobin A1c     Status: Abnormal   Collection Time: 03/11/22  5:28 AM  Result Value Ref Range   Hgb A1c MFr Bld 5.7 (H) 4.8 - 5.6 %   Mean Plasma Glucose 116.89 mg/dL  Lipid panel     Status: Abnormal   Collection Time: 03/11/22  5:28 AM  Result Value Ref Range   Cholesterol 119 0 - 200 mg/dL   Triglycerides 112 <150 mg/dL   HDL 31 (L) >40 mg/dL   Total CHOL/HDL Ratio 3.8 RATIO   VLDL 22 0 - 40 mg/dL   LDL Cholesterol 66 0 - 99 mg/dL   MR BRAIN WO CONTRAST  Result Date: 03/10/2022 CLINICAL DATA:  Initial evaluation for neuro deficit, stroke suspected. EXAM: MRI HEAD WITHOUT CONTRAST TECHNIQUE: Multiplanar, multiecho pulse  sequences of the brain and surrounding structures were obtained without intravenous contrast. COMPARISON:  Prior CTs from earlier the same day as well as previous MRI from 12/25/2019. FINDINGS: Brain: Patchy and confluent T2/FLAIR hyperintensity involving the periventricular deep white matter both cerebral hemispheres, consistent with chronic microvascular ischemic disease, moderately advanced. Few scatter remote lacunar infarcts present about the hemispheric cerebral white matter and thalami. Approximate 2 cm acute ischemic nonhemorrhagic infarcts seen involving the right frontal centrum semi ovale (series 7, image 21). No associated hemorrhage or mass effect. Gray-white matter differentiation otherwise maintained. No acute intracranial hemorrhage. Few chronic micro hemorrhages noted, most notably in the right thalamus, likely hypertensive in nature. No mass lesion, midline shift or mass effect. No hydrocephalus or extra-axial fluid collection. Pituitary gland and suprasellar region normal. Vascular: Major intracranial vascular flow voids are maintained. Skull and upper cervical spine: Craniocervical junction normal. Bone marrow signal intensity within normal limits. No scalp soft tissue abnormality. Sinuses/Orbits: Globes and orbital soft tissues within normal limits. Mild scattered mucosal thickening noted about the ethmoidal air cells. Other: No mastoid effusion. IMPRESSION: 1. 2 cm acute ischemic nonhemorrhagic infarct involving the right frontal centrum semi ovale. 2. Underlying moderately advanced chronic microvascular ischemic disease with a few scattered remote lacunar infarcts about the hemispheric cerebral white matter and thalami. Electronically Signed   By: Jeannine Boga M.D.   On: 03/10/2022 20:35   CT ANGIO HEAD NECK W WO CM (CODE STROKE)  Result Date: 03/10/2022 CLINICAL DATA:  Stroke, follow up EXAM: CT ANGIOGRAPHY HEAD AND NECK TECHNIQUE: Multidetector CT imaging of the head and neck was  performed using the standard protocol during bolus administration of intravenous contrast. Multiplanar CT image reconstructions and MIPs were obtained to evaluate the vascular anatomy. Carotid stenosis measurements (when applicable) are obtained utilizing NASCET criteria, using the distal internal carotid diameter as the denominator. RADIATION DOSE REDUCTION: This exam was performed according to the departmental dose-optimization program which includes automated exposure control, adjustment of the mA and/or kV according to patient size and/or use of iterative reconstruction technique. CONTRAST:  125m OMNIPAQUE IOHEXOL 350 MG/ML SOLN COMPARISON:  CT head from the same day. FINDINGS: CTA NECK FINDINGS Aortic arch: Great vessel origins are patent. Right carotid system: No evidence of dissection, stenosis (50% or greater), or occlusion. Left carotid system: No evidence of dissection, stenosis (50% or greater), or occlusion. Vertebral arteries: Codominant. No evidence of dissection, stenosis (50% or greater), or occlusion. Skeleton: No acute findings. Other neck: No acute findings. Upper chest: Visualized lung apices are clear. Review of the MIP images confirms the above findings CTA HEAD FINDINGS Anterior circulation: Bilateral intracranial ICAs, MCAs, and ACAs are patent without proximal hemodynamically significant stenosis. Posterior circulation: Bilateral intradural vertebral arteries and basilar artery and bilateral posterior cerebral arteries are patent without proximal hemodynamically significant stenosis. Venous sinuses: As permitted by contrast timing, patent. Review of the MIP images confirms the above findings IMPRESSION: No emergent large vessel occlusion or proximal hemodynamically significant stenosis. Electronically Signed   By: FMargaretha SheffieldM.D.   On: 03/10/2022 17:13   CT HEAD CODE STROKE WO CONTRAST  Result Date: 03/10/2022 CLINICAL DATA:  Code stroke. Neuro deficit, acute, stroke suspected.  Left-sided numbness and weakness. EXAM: CT HEAD WITHOUT CONTRAST TECHNIQUE: Contiguous axial images were obtained from the base of the skull through the vertex without intravenous contrast. RADIATION DOSE REDUCTION: This exam was performed according to the departmental dose-optimization program which includes automated exposure control, adjustment of the mA and/or kV according  to patient size and/or use of iterative reconstruction technique. COMPARISON:  Head CT 12/24/2019 and MRI 12/25/2019 FINDINGS: Brain: There is no evidence of an acute infarct, intracranial hemorrhage, mass, midline shift, or extra-axial fluid collection. Patchy hypodensities in the cerebral white matter bilaterally are similar to the prior CT. A chronic lacunar infarct in the right thalamus was acute in 2021. The ventricles are normal in size. Vascular: Calcified atherosclerosis at the skull base. No hyperdense vessel. Skull: No fracture or suspicious osseous lesion. Sinuses/Orbits: Mild left ethmoid air cell mucosal thickening. Clear mastoid air cells. Unremarkable orbits. Other: None. ASPECTS ([place] Stroke Program Early CT Score) - Ganglionic level infarction (caudate, lentiform nuclei, internal capsule, insula, M1-M3 cortex): 7 - Supraganglionic infarction (M4-M6 cortex): 3 Total score (0-10 with 10 being normal): 10 These results were communicated to Dr. Leonel Ramsay at 4:50 pm on 03/10/2022 by text page via the [facility] messaging system. IMPRESSION: 1. No evidence of acute intracranial abnormality. ASPECTS of 10. 2. 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 3. Chronic lacunar infarct in the right thalamus. Electronically Signed   By: Logan Bores M.D.   On: 03/10/2022 16:50     Assessment/Plan: Diagnosis: Right frontal ischemic CVA Does the need for close, 24 hr/day medical supervision in concert with the patient's rehab needs make it unreasonable for this patient to be served in a less intensive setting?  Yes Co-Morbidities requiring supervision/potential complications:  Obesity: provide dietary education Atrial fibrillation: continue flecainide Neuropathy: continue gabapentin HLD HTN Due to bladder management, bowel management, safety, skin/wound care, disease management, medication administration, pain management, and patient education, does the patient require 24 hr/day rehab nursing? Yes Does the patient require coordinated care of a physician, rehab nurse, therapy disciplines of PT, OT to address physical and functional deficits in the context of the above medical diagnosis(es)? Yes Addressing deficits in the following areas: balance, endurance, locomotion, strength, transferring, bowel/bladder control, bathing, dressing, feeding, grooming, toileting, and psychosocial support Can the patient actively participate in an intensive therapy program of at least 3 hrs of therapy per day at least 5 days per week? Yes The potential for patient to make measurable gains while on inpatient rehab is excellent Anticipated functional outcomes upon discharge from inpatient rehab are modified independent  with PT, modified independent with OT, independent with SLP. Estimated rehab length of stay to reach the above functional goals is: 5-7 days Anticipated discharge destination: Home Overall Rehab/Functional Prognosis: excellent  RECOMMENDATIONS: This patient's condition is appropriate for continued rehabilitative care in the following setting: CIR Patient has agreed to participate in recommended program. Yes Note that insurance prior authorization may be required for reimbursement for recommended care.   Izora Ribas, MD 03/11/2022

## 2022-03-11 NOTE — Evaluation (Signed)
Occupational Therapy Evaluation Patient Details Name: Carlos Nolan MRN: [id number] DOB: May 30, 1965 Today's Date: 03/11/2022   History of Present Illness Pt is a 56 year old male with history of lacunar infarct with left upper extremity weakness and baseline difficulty walking, hypertension, currently on Xarelto, neuropathy, hyperlipidemia chronic back pain neck pain with numbness and tingling who presents emergency department for chief concerns of left-sided weakness. Per MRI impression: "2 cm acute ischemic nonhemorrhagic infarct involving the right  frontal centrum semi ovale".   Clinical Impression   Patient received for OT evaluation. See flowsheet below for details of function. Generally, patient requiring CGA for transfers, CGA with RW for functional mobility, and CGA-MIN A for ADLs. LUE deficits as noted below. Pt has hx of CVA with L involvement, but deficits (as stated below) are much increased from baseline. Patient will benefit from continued OT while in acute care. Pt will benefit from acute inpatient rehab at d/c; able to tolerate 3 hours of therapy per day, is highly motivated, has good family support at home when discharged from rehab, and needs intensity of inpatient rehab to return to prior level of function.     Recommendations for follow up therapy are one component of a multi-disciplinary discharge planning process, led by the attending physician.  Recommendations may be updated based on patient status, additional functional criteria and insurance authorization.   Follow Up Recommendations  Acute inpatient rehab (3hours/day)    Assistance Recommended at Discharge Intermittent Supervision/Assistance  Patient can return home with the following A little help with walking and/or transfers;A little help with bathing/dressing/bathroom;Assistance with cooking/housework;Direct supervision/assist for medications management;Direct supervision/assist for financial management;Assist  for transportation;Help with stairs or ramp for entrance    Functional Status Assessment  Patient has had a recent decline in their functional status and demonstrates the ability to make significant improvements in function in a reasonable and predictable amount of time.  Equipment Recommendations  Other (comment) (defer to next level of care)    Recommendations for Other Services       Precautions / Restrictions Precautions Precautions: Fall Restrictions Weight Bearing Restrictions: No      Mobility Bed Mobility                    Transfers Overall transfer level: Needs assistance Equipment used: Rolling walker (2 wheels) Transfers: Sit to/from Stand Sit to Stand: Supervision, From elevated surface                  Balance                                           ADL either performed or assessed with clinical judgement   ADL Overall ADL's : Needs assistance/impaired Eating/Feeding: Set up;Sitting Eating/Feeding Details (indicate cue type and reason): with use of RUE; LUE for assist Grooming: Min guard;Standing;Oral care Grooming Details (indicate cue type and reason): used L hand to hold toothpaste while R hand unscrewed toothpaste cap; brushed teeth with dominant R hand Upper Body Bathing: Set up;Sitting Upper Body Bathing Details (indicate cue type and reason): anticipated Lower Body Bathing: Set up;Min guard;Sit to/from stand (will require grab bar for safety during standing LB bathing) Lower Body Bathing Details (indicate cue type and reason): anticipated Upper Body Dressing : Set up;Sitting Upper Body Dressing Details (indicate cue type and reason): anticipated Lower Body Dressing: Min guard;Sit to/from stand (RW  for safety) Lower Body Dressing Details (indicate cue type and reason): anticipated Toilet Transfer: Loss adjuster, chartered Details (indicate cue type and reason): pt transferred (dry run) to regular  commode; needing to use grab bar on L for stability; sit to stand with L grab bar and R hand on RW with OT stabilizing RW; pt unable to stand without BIL hand support (pt states this is a change from baseline). Toileting- Clothing Manipulation and Hygiene: Set up;Min guard;Sit to/from stand (use of RW when standing) Toileting - Clothing Manipulation Details (indicate cue type and reason): anticipated Tub/ Shower Transfer: Min guard;Shower Scientist, research (medical) Details (indicate cue type and reason): anticipated Functional mobility during ADLs: Min guard;Rolling walker (2 wheels) (very narrow base of support during mobility) General ADL Comments: Pt LUE very slow and decreased coordination and decreased strength; affects ADLs and IADLs (pt with desk job, needing LUE functioning)     Vision Baseline Vision/History: 1 Wears glasses Ability to See in Adequate Light: 0 Adequate Patient Visual Report:  (pt wearing his glasses today; no complaint of visual change; not explicitly tested today; next session)       Perception     Praxis      Pertinent Vitals/Pain Pain Assessment Pain Assessment: No/denies pain     Hand Dominance Right   Extremity/Trunk Assessment Upper Extremity Assessment Upper Extremity Assessment: LUE deficits/detail RUE Deficits / Details: No RUE deficits RUE Sensation: WNL RUE Coordination: WNL LUE Deficits / Details: L shoulder flex, grip strength, and elbow flex/ext all 3+ to 4-/5 LUE:  (shoulder appears WNL; able to flex shoulder through almost full ROM actively, slow.) LUE Sensation: decreased light touch (proprioception intact upon testing) LUE Coordination: decreased fine motor;decreased gross motor (Pt able to perform thumb opposition with all fingers. Unable to fully extend 4th and 5th fingers LUE. appears to have more ulnar nerve distribution deficits.)   Lower Extremity Assessment Lower Extremity Assessment: Defer to PT evaluation RLE Deficits /  Details: RLE strength WNL RLE Sensation: WNL RLE Coordination: WNL LLE Deficits / Details: LLE hip flex, ankle DF, and knee ext/flex strength all 3+ to 4-/5 LLE Sensation: decreased light touch (Sensation to light touch intact but decreased compared to R; proprioception intact) LLE Coordination: decreased gross motor   Cervical / Trunk Assessment Cervical / Trunk Assessment: Normal   Communication Communication Communication: No difficulties   Cognition Arousal/Alertness: Awake/alert Behavior During Therapy: WFL for tasks assessed/performed Overall Cognitive Status: Within Functional Limits for tasks assessed                                 General Comments: Motivated, pleasant, follows all cues.     General Comments       Exercises     Shoulder Instructions      Home Living Family/patient expects to be discharged to:: Private residence Living Arrangements: Spouse/significant other Available Help at Discharge: Family;Available 24 hours/day Type of Home: House Home Access: Stairs to enter [employer] of Steps: 4 Entrance Stairs-Rails: Right;Left;Can reach both Home Layout: Two level;Able to live on main level with bedroom/bathroom     Bathroom Shower/Tub: Occupational psychologist: Standard     Home Equipment: Cane - single point;Wheelchair - manual   Additional Comments: Spouse retired and Engineer, mining; pt works Designer, multimedia.      Prior Functioning/Environment Prior Level of Function : Independent/Modified Independent  Mobility Comments: Ind amb without AD community distances, 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, works a Network engineer job ADLs Comments: Ind with ADLs        OT Problem List: Decreased strength      OT Treatment/Interventions:      OT Goals(Current goals can be found in the care plan section) Acute Rehab OT Goals Patient Stated Goal: get function in L side back to baseline. OT Goal Formulation: With patient/family Time  For Goal Achievement: 03/25/22 Potential to Achieve Goals: Good ADL Goals Pt Will Perform Grooming: Independently;standing Pt Will Perform Upper Body Dressing: Independently;sitting Pt Will Perform Lower Body Dressing: Independently;sit to/from stand Pt Will Transfer to Toilet: Independently;regular height toilet  OT Frequency:      Co-evaluation              AM-PAC OT "6 Clicks" Daily Activity     Outcome Measure                 End of Session Equipment Utilized During Treatment: Rolling walker (2 wheels) Nurse Communication: Mobility status  Activity Tolerance: Patient tolerated treatment well;Patient limited by fatigue (pt stating that he feels fatigued after walking to the bathroom and back to the chair; states his baseline he would not be fatigued with this level of activity.) Patient left: in chair;with chair alarm set;with family/visitor present  OT Visit Diagnosis: Unsteadiness on feet (R26.81);Muscle weakness (generalized) (M62.81)                Time: 4158-3094 OT Time Calculation (min): 27 min Charges:  OT General Charges $OT Visit: 1 Visit OT Evaluation $OT Eval Moderate Complexity: 1 Mod OT Treatments $Self Care/Home Management : 8-22 mins  Waymon Amato, MS, OTR/L   Vania Rea 03/11/2022, 1:23 PM

## 2022-03-11 NOTE — Progress Notes (Signed)
Subjective: No significant changes  Exam: Vitals:   03/10/22 2250 03/11/22 0810  BP: (!) 150/84 (!) 146/92  Pulse: 66 67  Resp: 17 16  Temp: 98.3 F (36.8 C) (!) 97.4 F (36.3 C)  SpO2: 99% 99%   Gen: In bed, NAD Resp: non-labored breathing, no acute distress Abd: soft, nt  Neuro: MS: Awake, alert, interactive and appropriate CN: Visual fields full, EOMI Motor: He has 4/5 weakness of the left arm and leg  Pertinent Labs: LDL 66  Impression: 56 year old male with subcortical stroke on the right.  He does have a history of atrial fibrillation, but the subcortical location would make me think that this is rather small vessel disease.  This would be managed with risk factor modification.  He already has good LDL control, but states that his blood pressure does tend to run high.  Xarelto's pharmacodynamics could suggest that it provides less coverage towards the end of it's dose, and given that his symptoms started 20 hours after his last dose, think this potentially could have played a role.  I would favor changing him to Eliquis for twice daily dosing.  Recommendations: 1) Permissive hypertension through today, begin lowering gradually tomorrow. 2) Eliquis 5 mg twice daily starting tomorrow 3) continue atorvastatin 40 mg (LDL at goal) 4) I discussed that there is some stroke prevention data with the Mediterranean diet, 5) PT, OT, ST, likely will need rehab 6) neurology will be available as needed.  Roland Rack, MD [facility] [phone]  If 7pm- 7am, please page neurology on call as listed in [place].

## 2022-03-11 NOTE — Plan of Care (Signed)

## 2022-03-11 NOTE — Progress Notes (Addendum)
SLP Cancellation Note  Patient Details Name: Carlos Nolan MRN: [id number] DOB: 12/14/1965   Cancelled treatment:       Reason Eval/Treat Not Completed: SLP screened, no needs identified, will sign off (chart reviewed; consulted NSG then met w/ pt in room. Family present.) Pt denied any difficulty swallowing and is currently on a regular diet; tolerates swallowing pills w/ water per NSG. Pt conversed in conversation w/out expressive/receptive deficits noted; pt denied any speech-language deficits. Speech clear. Family present agreed. Pt also using texting/phone call w/ others appropriately per pt/Family. Pt endorsed LUE weakness; encouraged to use the LUE often to engage/strengthen, as recommended by OT. Pt agreed. No further skilled ST services indicated as pt appears at his baseline. Pt agreed. NSG to reconsult if any change in status while admitted.      Orinda Kenner, MS, CCC-SLP Speech Language Pathologist Rehab Services; [place] [phone] (ascom) Zared Knoth 03/11/2022, 1:59 PM

## 2022-03-11 NOTE — Progress Notes (Signed)
  Echocardiogram 2D Echocardiogram has been performed.  Carlos Nolan 03/11/2022, 3:29 PM

## 2022-03-11 NOTE — Progress Notes (Signed)
Progress Note   Patient: Carlos Nolan:[id number] DOB: 05-22-1965 DOA: 03/10/2022     0 DOS: the patient was seen and examined on 03/11/2022   Brief hospital course: [name] is a 56 year old male with history of lacunar infarct with left upper extremity weakness and baseline difficulty walking, hypertension, currently on Xarelto, neuropathy, hyperlipidemia chronic back pain neck pain with numbness and tingling who presented to the ED for evaluation of acute onset  left-sided weakness.  He was admitted for stroke evaluation and MRI brain confirmed a 2 cm acute infarct involving the right frontal centrum semi ovale.  CTA head and neck was negative for large vessel occlusions or other significant proximal stenosis.  Neurology following. PT/OT recommend acute inpatient rehab.   Assessment and Plan: * Acute ischemic stroke ([place]) Presented with left-sided weakness with brain MRI showing 2 cm acute infarct at the right frontal semi ovale.   -- Neurology consulted --Permissive hypertension --PT OT recommending acute inpatient rehab --No SLP needs --Echo report is pending --Telemetry --Xarelto is on hold -- Switch to Eliquis 5 mg twice daily starting tomorrow --Continue Lipitor 40 mg daily --Risk factor reduction including BP control, Mediterranean diet recommended  Atrial fibrillation, chronic (HCC) Continue flecainide 50 mg p.o. twice daily Hold Xarelto for now Switch to Eliquis likely tomorrow for twice daily dosing per neurology  Hyperlipidemia - Atorvastatin 40 mg daily   Essential hypertension Home regimen: Metoprolol succinate 50 mg daily, HCTZ 25 mg daily -held for permissive hypertension IV labetalol PRN SBP>180  S/P ablation of atrial fibrillation Xarelto on hold with plan to transition to Eliquis        Subjective: Patient seen with wife at bedside today.  He reports ongoing left-sided weakness.  Agreeable to rehab as recommended by PT and OT.  Denies  any issues with swallowing or speaking. We discussed the findings of MRI which confirmed another stroke.  Denies any new or worsening neurologic symptoms today.  Physical Exam: Vitals:   03/10/22 2130 03/10/22 2250 03/11/22 0810 03/11/22 1203  BP: (!) 141/87 (!) 150/84 (!) 146/92 (!) 146/90  Pulse: 68 66 67 82  Resp: '16 17 16   '$ Temp:  98.3 F (36.8 C) (!) 97.4 F (36.3 C) 98.3 F (36.8 C)  TempSrc:  Oral    SpO2: 98% 99% 99% 98%  Weight:      Height:       General exam: awake, alert, no acute distress HEENT: atraumatic, clear conjunctiva, anicteric sclera, moist mucus membranes, hearing grossly normal  Respiratory system: CTAB, no wheezes, rales or rhonchi, normal respiratory effort. Cardiovascular system: normal S1/S2, RRR, no pedal edema.   Gastrointestinal system: soft, NT, ND, no HSM felt, +bowel sounds. Central nervous system: A&O x.  Left upper extremity weakness, normal speech Extremities: moves all, no edema, normal tone Skin: dry, intact, normal temperature Psychiatry: normal mood, congruent affect, judgement and insight appear normal   Data Reviewed:  Notable labs: Normal lipid profile with exception of low HDL 31 .  Hemoglobin A1c 5.7  Echocardiogram pending  Family Communication: Wife at bedside on rounds today  Disposition: Status is: Observation The patient remains OBS appropriate and will d/c before 2 midnights. Evaluation still underway.  Echo results are pending. Remains on permissive hypertension and will require gradual BP lowering starting tomorrow.    Planned Discharge Destination:  Acute inpatient rehab recommended    Time spent: 45 minutes  Author: Ezekiel Slocumb, DO 03/11/2022 3:49 PM  For on call review  [email or web address]

## 2022-03-11 NOTE — Progress Notes (Signed)
Inpatient Rehab Admissions Coordinator:  Consult received. Note pt is under observation status at this time. Pt may not have the medical necessity to warrant an inpatient rehab stay if they remain observation. If status were to change to inpatient, [facility] will screen for candidacy.   Gayland Curry, [place], [place] Admissions Coordinator [phone]

## 2022-03-11 NOTE — Evaluation (Signed)
Physical Therapy Evaluation Patient Details Name: Carlos Nolan MRN: [id number] DOB: 06-28-1965 Today's Date: 03/11/2022  History of Present Illness  Pt is a 56 year old male with history of lacunar infarct with left upper extremity weakness and baseline difficulty walking, hypertension, currently on Xarelto, neuropathy, hyperlipidemia chronic back pain neck pain with numbness and tingling who presents emergency department for chief concerns of left-sided weakness. Per MRI impression: "2 cm acute ischemic nonhemorrhagic infarct involving the right  frontal centrum semi ovale".   Clinical Impression  Pt was pleasant and motivated to participate during the session and put forth good effort throughout. Pt presented with deficits in strength, coordination, and sensation to light touch in his LUE and LLE compared to his stated baseline.  No deficits to proprioception or vision noted.  Pt required no physical assistance with functional tasks but did require extra time and effort as well as cuing for general sequencing for safety.  Pt with noted difficulty advancing his LLE both with the RW and with using the hand rail in the hall with min L knee buckling that the pt was able to self-correct.  Pt did subjectively report feeling as if his L knee could "give out" with step-to gait pattern education provided and recommendation to use a RW discussed to minimize risk of falls.  Pt will benefit from PT services in an IR setting upon discharge to safely address deficits listed in patient problem list for decreased caregiver assistance and eventual return to PLOF.         Recommendations for follow up therapy are one component of a multi-disciplinary discharge planning process, led by the attending physician.  Recommendations may be updated based on patient status, additional functional criteria and insurance authorization.  Follow Up Recommendations Acute inpatient rehab (3hours/day)      Assistance  Recommended at Discharge Frequent or constant Supervision/Assistance  Patient can return home with the following  A little help with walking and/or transfers;A little help with bathing/dressing/bathroom;Assistance with cooking/housework;Help with stairs or ramp for entrance;Assist for transportation    Equipment Recommendations Rolling walker (2 wheels)  Recommendations for Other Services       Functional Status Assessment Patient has had a recent decline in their functional status and demonstrates the ability to make significant improvements in function in a reasonable and predictable amount of time.     Precautions / Restrictions Precautions Precautions: Fall Restrictions Weight Bearing Restrictions: No      Mobility  Bed Mobility Overal bed mobility: Modified Independent             General bed mobility comments: Min extra time and effort and use of bed rail    Transfers Overall transfer level: Needs assistance Equipment used: Rolling walker (2 wheels) Transfers: Sit to/from Stand Sit to Stand: Supervision, From elevated surface           General transfer comment: Extra effort to come to standing but steady upon standing without LOB    Ambulation/Gait Ambulation/Gait assistance: Min guard Gait Distance (Feet): 80 Feet x 2 (once with a RW and once with the hallway hand rail) Assistive device: Rolling walker (2 wheels) (hallway railing) Gait Pattern/deviations: Step-to pattern, Decreased stance time - left, Decreased step length - right, Narrow base of support Gait velocity: decreased     General Gait Details: Mod verbal and visual cues for step-to sequencing to address LLE weakness both with a RW and with use of hallway hand rail  Stairs  Wheelchair Mobility    Modified Rankin (Stroke Patients Only)       Balance Overall balance assessment: Needs assistance   Sitting balance-Leahy Scale: Normal     Standing balance support:  Bilateral upper extremity supported, Single extremity supported, During functional activity, Reliant on assistive device for balance Standing balance-Leahy Scale: Fair                               Pertinent Vitals/Pain Pain Assessment Pain Assessment: No/denies pain    Home Living Family/patient expects to be discharged to:: Private residence Living Arrangements: Spouse/significant other Available Help at Discharge: Family;Available 24 hours/day Type of Home: House Home Access: Stairs to enter Entrance Stairs-Rails: Right;Left;Can reach both Entrance Stairs-Number of Steps: 4   Home Layout: Two level;Able to live on main level with bedroom/bathroom Home Equipment: [name] - single point;Wheelchair - manual Additional Comments: Spouse retired and available 24/7    Prior Function Prior Level of Function : Independent/Modified Independent             Mobility Comments: Ind amb without AD community distances, 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, works a Network engineer job ADLs Comments: Ind with ADLs     Hand Dominance   Dominant Hand: Right    Extremity/Trunk Assessment   Upper Extremity Assessment Upper Extremity Assessment: LUE deficits/detail;RUE deficits/detail RUE Deficits / Details: RUE strength WNL RUE Sensation: WNL RUE Coordination: WNL LUE Deficits / Details: L shoulder flex, grip strength, and elbow flex/ext all 3+ to 4-/5 LUE Sensation: decreased light touch (Sensation to light touch intact but decreased compared to R; proprioception intact) LUE Coordination: decreased fine motor;decreased gross motor    Lower Extremity Assessment Lower Extremity Assessment: LLE deficits/detail;RLE deficits/detail RLE Deficits / Details: RLE strength WNL RLE Sensation: WNL RLE Coordination: WNL LLE Deficits / Details: LLE hip flex, ankle DF, and knee ext/flex strength all 3+ to 4-/5 LLE Sensation: decreased light touch (Sensation to light touch intact but decreased compared to R;  proprioception intact) LLE Coordination: decreased gross motor    Cervical / Trunk Assessment Cervical / Trunk Assessment: Normal  Communication   Communication: No difficulties  Cognition Arousal/Alertness: Awake/alert Behavior During Therapy: WFL for tasks assessed/performed Overall Cognitive Status: Within Functional Limits for tasks assessed                                          General Comments      Exercises     Assessment/Plan    PT Assessment Patient needs continued PT services  PT Problem List Decreased strength;Decreased balance;Decreased mobility;Decreased coordination;Decreased knowledge of use of DME       PT Treatment Interventions DME instruction;Gait training;Stair training;Functional mobility training;Therapeutic activities;Therapeutic exercise;Balance training;Neuromuscular re-education;Patient/family education    PT Goals (Current goals can be found in the Care Plan section)  Acute Rehab PT Goals Patient Stated Goal: Improved strength, balance, and coordination PT Goal Formulation: With patient Time For Goal Achievement: 03/24/22 Potential to Achieve Goals: Good    Frequency 7X/week     Co-evaluation               AM-PAC PT "6 Clicks" Mobility  Outcome Measure Help needed turning from your back to your side while in a flat bed without using bedrails?: None Help needed moving from lying on your back to sitting on the side of a flat  bed without using bedrails?: A Little Help needed moving to and from a bed to a chair (including a wheelchair)?: A Little Help needed standing up from a chair using your arms (e.g., wheelchair or bedside chair)?: A Little Help needed to walk in hospital room?: A Little Help needed climbing 3-5 steps with a railing? : A Little 6 Click Score: 19    End of Session Equipment Utilized During Treatment: Gait belt Activity Tolerance: Patient tolerated treatment well Patient left: in chair;with call  bell/phone within reach;with chair alarm set Nurse Communication: Mobility status PT Visit Diagnosis: Unsteadiness on feet (R26.81);Difficulty in walking, not elsewhere classified (R26.2);Hemiplegia and hemiparesis;Muscle weakness (generalized) (M62.81) Hemiplegia - Right/Left: Left Hemiplegia - dominant/non-dominant: Non-dominant Hemiplegia - caused by: Cerebral infarction    Time: 4656-8127 PT Time Calculation (min) (ACUTE ONLY): 35 min   Charges:   PT Evaluation $PT Eval Moderate Complexity: 1 Mod PT Treatments $Gait Training: 8-22 mins        D. Scott Darianny Momon PT, DPT 03/11/22, 10:18 AM

## 2022-03-12 DIAGNOSIS — I639 Cerebral infarction, unspecified: Secondary | ICD-10-CM | POA: Diagnosis not present

## 2022-03-12 LAB — URINE DRUG SCREEN, QUALITATIVE (ARMC ONLY)
Amphetamines, Ur Screen: NOT DETECTED
Barbiturates, Ur Screen: NOT DETECTED
Benzodiazepine, Ur Scrn: NOT DETECTED
Cannabinoid 50 Ng, Ur ~~LOC~~: NOT DETECTED
Cocaine Metabolite,Ur ~~LOC~~: NOT DETECTED
MDMA (Ecstasy)Ur Screen: NOT DETECTED
Methadone Scn, Ur: NOT DETECTED
Opiate, Ur Screen: NOT DETECTED
Phencyclidine (PCP) Ur S: NOT DETECTED
Tricyclic, Ur Screen: POSITIVE — AB

## 2022-03-12 MED ORDER — APIXABAN 5 MG PO TABS: 5.0000 mg | ORAL_TABLET | Freq: Two times a day (BID) | ORAL | 2 refills | Status: AC

## 2022-03-12 NOTE — Progress Notes (Signed)
Physical Therapy Treatment Patient Details Name: Carlos Nolan MRN: [id number] DOB: [birth year] Today's Date: 03/12/2022   History of Present Illness Pt is a 56 year old male with history of lacunar infarct with left upper extremity weakness and baseline difficulty walking, hypertension, currently on Xarelto, neuropathy, hyperlipidemia chronic back pain neck pain with numbness and tingling who presents emergency department for chief concerns of left-sided weakness. Per MRI impression: "2 cm acute ischemic nonhemorrhagic infarct involving the right  frontal centrum semi ovale".    PT Comments    Pt is progressing with gait with RW (supervision level) and SPC (min guard level) not full step- through pattern but progressing wtih slightly greater weight shift on to LLE and more L knee control.   He's ModI bed mobilty, supervision with transfers and with gait using RW and he has support at home. After discussing with supervising PT, d/c recommendations for PT are updgrded to Out patient Neuro rehab.  Recommendations for follow up therapy are one component of a multi-disciplinary discharge planning process, led by the attending physician.  Recommendations may be updated based on patient status, additional functional criteria and insurance authorization.  Follow Up Recommendations  Outpatient PT     Assistance Recommended at Discharge Intermittent Supervision/Assistance  Patient can return home with the following A little help with walking and/or transfers;A little help with bathing/dressing/bathroom;Assistance with cooking/housework;Help with stairs or ramp for entrance;Assist for transportation   Equipment Recommendations  Rolling walker (2 wheels)    Recommendations for Other Services       Precautions / Restrictions Precautions Precautions: Fall     Mobility  Bed Mobility               General bed mobility comments: Pt up in recliner.    Transfers Overall transfer level:  Needs assistance Equipment used: Rolling walker (2 wheels), Straight cane Transfers: Sit to/from Stand Sit to Stand: Supervision           General transfer comment: No LOB sit>stand;  Slight imbalance with sit downs.    Ambulation/Gait Ambulation/Gait assistance: Min guard Gait Distance (Feet): 120 Feet Assistive device: Straight cane (Supervision with RW 30") Gait Pattern/deviations: Decreased stance time - left, Decreased step length - right, Narrow base of support, Step-through pattern Gait velocity: decreased     General Gait Details: Not full step- through pattern but progressing wtih slightly greater weight shift on to LLE and more L knee control.   Stairs             Wheelchair Mobility    Modified Rankin (Stroke Patients Only)       Balance Overall balance assessment: Needs assistance Sitting-balance support: Feet supported Sitting balance-Leahy Scale: Normal     Standing balance support: Bilateral upper extremity supported, Single extremity supported, During functional activity, Reliant on assistive device for balance Standing balance-Leahy Scale: Fair Standing balance comment: Training for weight shifts onto LLE with manual cues for knee control and posture, progressed with upper trunk rotations with SPC for support; min guard to min A.                            Cognition Arousal/Alertness: Awake/alert Behavior During Therapy: WFL for tasks assessed/performed Overall Cognitive Status: Within Functional Limits for tasks assessed                                 General Comments:  Motivated, pleasant, follows all cues.        Exercises Other Exercises Other Exercises: seated: LAQ, hamstring curls, hip  flexion, knee-to-chest hip extensor stretch 5-10x each with cues for technique.    General Comments        Pertinent Vitals/Pain Pain Assessment Pain Assessment: No/denies pain    Home Living                           Prior Function            PT Goals (current goals can now be found in the care plan section) Acute Rehab PT Goals Patient Stated Goal: Improved strength, balance, and coordination PT Goal Formulation: With patient Time For Goal Achievement: 03/24/22 Potential to Achieve Goals: Good Progress towards PT goals: Progressing toward goals    Frequency    7X/week      PT Plan      Co-evaluation              AM-PAC PT "6 Clicks" Mobility   Outcome Measure  Help needed turning from your back to your side while in a flat bed without using bedrails?: None Help needed moving from lying on your back to sitting on the side of a flat bed without using bedrails?: A Little Help needed moving to and from a bed to a chair (including a wheelchair)?: A Little Help needed standing up from a chair using your arms (e.g., wheelchair or bedside chair)?: None Help needed to walk in hospital room?: A Little Help needed climbing 3-5 steps with a railing? : A Little 6 Click Score: 20    End of Session Equipment Utilized During Treatment: Gait belt Activity Tolerance: Patient tolerated treatment well Patient left: in chair;with call bell/phone within reach;with chair alarm set Nurse Communication: Mobility status PT Visit Diagnosis: Unsteadiness on feet (R26.81);Difficulty in walking, not elsewhere classified (R26.2);Hemiplegia and hemiparesis;Muscle weakness (generalized) (M62.81) Hemiplegia - Right/Left: Left Hemiplegia - dominant/non-dominant: Non-dominant Hemiplegia - caused by: Cerebral infarction     Time: 0923-0950 PT Time Calculation (min) (ACUTE ONLY): 27 min  Charges:  $Gait Training: 8-22 mins $Neuromuscular Re-education: 8-22 mins                     Bjorn Loser, PTA  03/12/22, 10:29 AM

## 2022-03-12 NOTE — Progress Notes (Addendum)
Occupational Therapy Treatment Patient Details Name: Carlos Nolan MRN: [id number] DOB: [birth year] Today's Date: 03/12/2022   History of present illness Pt is a 56 year old male with history of lacunar infarct with left upper extremity weakness and baseline difficulty walking, hypertension, currently on Xarelto, neuropathy, hyperlipidemia chronic back pain neck pain with numbness and tingling who presents emergency department for chief concerns of left-sided weakness. Per MRI impression: "2 cm acute ischemic nonhemorrhagic infarct involving the right  frontal centrum semi ovale".   OT comments  Pt. presents with proximal left shoulder weakness, impaired motor control, and [facility] skills. Pt. education was provided about increasing opportunities for weightbearing, and proprioception through the LUE. Pt. completed reps of AROM for scapular elevation, depression, abduction/rotation. Pt. performed AROM with PROM to the end range for reps of shoulder flexion, abduction, horizontal adduction, elbow flexion and extension for hand to face patterns, forearm supination, wrist extension, and digit extension with emphasis placed on 4th, and 5th digit extension.  Pt. worked on reaching for objects within his room, and using the left hand to open grooming items/bottles. Pt. Continues to work on improving, and independence with ADLs, and IADLs. Pt. Continues to benefit from OT services for ADL training, A/E training, neuromuscular re-education, and pt. Education about home modification, and DME. Discharge recommendation has been changed to Outpatient OT services.   Recommendations for follow up therapy are one component of a multi-disciplinary discharge planning process, led by the attending physician.  Recommendations may be updated based on patient status, additional functional criteria and insurance authorization.    Follow Up Recommendations  Outpatient OT    Assistance Recommended at Discharge Intermittent  Supervision/Assistance  Patient can return home with the following  A little help with walking and/or transfers;A little help with bathing/dressing/bathroom;Assistance with cooking/housework;Direct supervision/assist for medications management;Direct supervision/assist for financial management;Assist for transportation;Help with stairs or ramp for entrance   Equipment Recommendations  Other (comment)    Recommendations for Other Services      Precautions / Restrictions Precautions Precautions: Fall       Mobility Bed Mobility    Pt. Up in chair upon arrival                Transfers    Sit to stand from the chair with CGA                     Balance                                           ADL either performed or assessed with clinical judgement   ADL Overall ADL's : Needs assistance/impaired       Grooming Details (indicate cue type and reason): Pt. is using the right hand to stabilize items while engaging the left hand to open items for grooming.                               General ADL Comments: Impaired LUE strength, motor control, and coordination skills are affecting ADLs and IADLs.    Extremity/Trunk Assessment              Vision Baseline Vision/History: 1 Wears glasses Ability to See in Adequate Light: 0 Adequate     Perception     Praxis      Cognition  Exercises      Shoulder Instructions       General Comments      Pertinent Vitals/ Pain        No reports of pain  Home Living                                          Prior Functioning/Environment              Frequency  Min 2X/week        Progress Toward Goals  OT Goals(current goals can now be found in the care plan section)  Progress towards OT goals: Progressing toward goals  Acute Rehab OT Goals Patient Stated Goal: Regain function  in the left hand OT Goal Formulation: With patient/family Time For Goal Achievement: 03/25/22 Potential to Achieve Goals: Good  Plan      Co-evaluation                 AM-PAC OT "6 Clicks" Daily Activity     Outcome Measure   Help from another person eating meals?: A Little Help from another person taking care of personal grooming?: A Little Help from another person toileting, which includes using toliet, bedpan, or urinal?: A Little Help from another person bathing (including washing, rinsing, drying)?: A Little Help from another person to put on and taking off regular upper body clothing?: A Little Help from another person to put on and taking off regular lower body clothing?: A Lot 6 Click Score: 17    End of Session Equipment Utilized During Treatment: Rolling walker (2 wheels)  OT Visit Diagnosis: Unsteadiness on feet (R26.81);Muscle weakness (generalized) (M62.81)   Activity Tolerance Patient tolerated treatment well;Patient limited by fatigue   Patient Left in chair;with chair alarm set;with family/visitor present   Nurse Communication Mobility status        Time: 3546-5681 OT Time Calculation (min): 33 min  Charges: OT General Charges $OT Visit: 1 Visit OT Treatments $Self Care/Home Management : 23-37 mins  Harrel Carina, MS, OTR/L   Harrel Carina 03/12/2022, 12:37 PM

## 2022-03-12 NOTE — Discharge Summary (Signed)
Physician Discharge Summary   Patient: Carlos Nolan MRN: [id number] DOB: 1965/09/27  Admit date:     03/10/2022  Discharge date: {dischdate:26783}  Discharge Physician: Ezekiel Slocumb   PCP: [facility]   Recommendations at discharge:  {Tip this will not be part of the note when signed- Example include specific recommendations for outpatient follow-up, pending tests to follow-up on. (Optional):26781}  ***  Discharge Diagnoses: Principal Problem:   Acute ischemic stroke [facility]) Active Problems:   S/P ablation of atrial fibrillation   Essential hypertension   Hyperlipidemia   Atrial fibrillation, chronic (HCC)  Resolved Problems:   * No resolved hospital problems. [facility] Course: Carlos Nolan is a 56 year old male with history of lacunar infarct with left upper extremity weakness and baseline difficulty walking, hypertension, currently on Xarelto, neuropathy, hyperlipidemia chronic back pain neck pain with numbness and tingling who presented to the ED for evaluation of acute onset  left-sided weakness.  He was admitted for stroke evaluation and MRI brain confirmed a 2 cm acute infarct involving the right frontal centrum semi ovale.  CTA head and neck was negative for large vessel occlusions or other significant proximal stenosis.  Neurology following. PT/OT recommend acute inpatient rehab.   Assessment and Plan: * Acute ischemic stroke ([place]) Presented with left-sided weakness with brain MRI showing 2 cm acute infarct at the right frontal semi ovale.   -- Neurology consulted --Permissive hypertension --PT OT recommending acute inpatient rehab --No SLP needs --Echo report is pending --Telemetry --Xarelto is on hold -- Switch to Eliquis 5 mg twice daily starting tomorrow --Continue Lipitor 40 mg daily --Risk factor reduction including BP control, Mediterranean diet recommended  Atrial fibrillation, chronic (HCC) Continue flecainide 50 mg p.o.  twice daily Hold Xarelto for now Switch to Eliquis likely tomorrow for twice daily dosing per neurology  Hyperlipidemia - Atorvastatin 40 mg daily   Essential hypertension Home regimen: Metoprolol succinate 50 mg daily, HCTZ 25 mg daily -held for permissive hypertension IV labetalol PRN SBP>180  S/P ablation of atrial fibrillation Xarelto on hold with plan to transition to Eliquis      {Tip this will not be part of the note when signed Body mass index is 31.01 kg/m. , ,  (Optional):26781}  {(NOTE) Pain control PDMP Statment (Optional):26782} Consultants: *** Procedures performed: ***  Disposition: {Plan; Disposition:26390} Diet recommendation:  {Diet_Plan:26776} DISCHARGE MEDICATION: Allergies as of 03/12/2022       Reactions   Banana Anaphylaxis   Penicillins Hives, Rash   Has patient had a PCN reaction causing immediate rash, facial/tongue/throat swelling, SOB or lightheadedness with hypotension:unsure Has patient had a PCN reaction causing severe rash involving mucus membranes or skin necrosis:No Has patient had a PCN reaction that required hospitalization:No Has patient had a PCN reaction occurring within the last 10 years:No If all of the above answers are "NO", then may proceed with Cephalosporin use.   Quinidine Rash     Med Rec must be completed prior to using this [facility]***       Discharge Exam: Filed Weights   03/10/22 1654  Weight: 106.6 kg   ***  Condition at discharge: {DC Condition:26389}  The results of significant diagnostics from this hospitalization (including imaging, microbiology, ancillary and laboratory) are listed below for reference.   Imaging Studies: MR BRAIN WO CONTRAST  Result Date: 03/10/2022 CLINICAL DATA:  Initial evaluation for neuro deficit, stroke suspected. EXAM: MRI HEAD WITHOUT CONTRAST TECHNIQUE: Multiplanar, multiecho pulse sequences of the brain and surrounding  structures were obtained without intravenous  contrast. COMPARISON:  Prior CTs from earlier the same day as well as previous MRI from 12/25/2019. FINDINGS: Brain: Patchy and confluent T2/FLAIR hyperintensity involving the periventricular deep white matter both cerebral hemispheres, consistent with chronic microvascular ischemic disease, moderately advanced. Few scatter remote lacunar infarcts present about the hemispheric cerebral white matter and thalami. Approximate 2 cm acute ischemic nonhemorrhagic infarcts seen involving the right frontal centrum semi ovale (series 7, image 21). No associated hemorrhage or mass effect. Gray-white matter differentiation otherwise maintained. No acute intracranial hemorrhage. Few chronic micro hemorrhages noted, most notably in the right thalamus, likely hypertensive in nature. No mass lesion, midline shift or mass effect. No hydrocephalus or extra-axial fluid collection. Pituitary gland and suprasellar region normal. Vascular: Major intracranial vascular flow voids are maintained. Skull and upper cervical spine: Craniocervical junction normal. Bone marrow signal intensity within normal limits. No scalp soft tissue abnormality. Sinuses/Orbits: Globes and orbital soft tissues within normal limits. Mild scattered mucosal thickening noted about the ethmoidal air cells. Other: No mastoid effusion. IMPRESSION: 1. 2 cm acute ischemic nonhemorrhagic infarct involving the right frontal centrum semi ovale. 2. Underlying moderately advanced chronic microvascular ischemic disease with a few scattered remote lacunar infarcts about the hemispheric cerebral white matter and thalami. Electronically Signed   By: Jeannine Boga M.D.   On: 03/10/2022 20:35   CT ANGIO HEAD NECK W WO CM (CODE STROKE)  Result Date: 03/10/2022 CLINICAL DATA:  Stroke, follow up EXAM: CT ANGIOGRAPHY HEAD AND NECK TECHNIQUE: Multidetector CT imaging of the head and neck was performed using the standard protocol during bolus administration of intravenous  contrast. Multiplanar CT image reconstructions and MIPs were obtained to evaluate the vascular anatomy. Carotid stenosis measurements (when applicable) are obtained utilizing NASCET criteria, using the distal internal carotid diameter as the denominator. RADIATION DOSE REDUCTION: This exam was performed according to the departmental dose-optimization program which includes automated exposure control, adjustment of the mA and/or kV according to patient size and/or use of iterative reconstruction technique. CONTRAST:  146m OMNIPAQUE IOHEXOL 350 MG/ML SOLN COMPARISON:  CT head from the same day. FINDINGS: CTA NECK FINDINGS Aortic arch: Great vessel origins are patent. Right carotid system: No evidence of dissection, stenosis (50% or greater), or occlusion. Left carotid system: No evidence of dissection, stenosis (50% or greater), or occlusion. Vertebral arteries: Codominant. No evidence of dissection, stenosis (50% or greater), or occlusion. Skeleton: No acute findings. Other neck: No acute findings. Upper chest: Visualized lung apices are clear. Review of the MIP images confirms the above findings CTA HEAD FINDINGS Anterior circulation: Bilateral intracranial ICAs, MCAs, and ACAs are patent without proximal hemodynamically significant stenosis. Posterior circulation: Bilateral intradural vertebral arteries and basilar artery and bilateral posterior cerebral arteries are patent without proximal hemodynamically significant stenosis. Venous sinuses: As permitted by contrast timing, patent. Review of the MIP images confirms the above findings IMPRESSION: No emergent large vessel occlusion or proximal hemodynamically significant stenosis. Electronically Signed   By: FMargaretha SheffieldM.D.   On: 03/10/2022 17:13   CT HEAD CODE STROKE WO CONTRAST  Result Date: 03/10/2022 CLINICAL DATA:  Code stroke. Neuro deficit, acute, stroke suspected. Left-sided numbness and weakness. EXAM: CT HEAD WITHOUT CONTRAST TECHNIQUE:  Contiguous axial images were obtained from the base of the skull through the vertex without intravenous contrast. RADIATION DOSE REDUCTION: This exam was performed according to the departmental dose-optimization program which includes automated exposure control, adjustment of the mA and/or kV according to patient size and/or use  of iterative reconstruction technique. COMPARISON:  Head CT 12/24/2019 and MRI 12/25/2019 FINDINGS: Brain: There is no evidence of an acute infarct, intracranial hemorrhage, mass, midline shift, or extra-axial fluid collection. Patchy hypodensities in the cerebral white matter bilaterally are similar to the prior CT. A chronic lacunar infarct in the right thalamus was acute in 2021. The ventricles are normal in size. Vascular: Calcified atherosclerosis at the skull base. No hyperdense vessel. Skull: No fracture or suspicious osseous lesion. Sinuses/Orbits: Mild left ethmoid air cell mucosal thickening. Clear mastoid air cells. Unremarkable orbits. Other: None. ASPECTS ([place] Stroke Program Early CT Score) - Ganglionic level infarction (caudate, lentiform nuclei, internal capsule, insula, M1-M3 cortex): 7 - Supraganglionic infarction (M4-M6 cortex): 3 Total score (0-10 with 10 being normal): 10 These results were communicated to Dr. Leonel Ramsay at 4:50 pm on 03/10/2022 by text page via the [facility] messaging system. IMPRESSION: 1. No evidence of acute intracranial abnormality. ASPECTS of 10. 2. 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 3. Chronic lacunar infarct in the right thalamus. Electronically Signed   By: Logan Bores M.D.   On: 03/10/2022 16:50    Microbiology: Results for orders placed or performed during the hospital encounter of 08/07/20  SARS CORONAVIRUS 2 (TAT 6-24 HRS) Nasopharyngeal Nasopharyngeal Swab     Status: None   Collection Time: 08/07/20  9:19 AM   Specimen: Nasopharyngeal Swab  Result Value Ref Range Status   SARS Coronavirus 2  NEGATIVE NEGATIVE Final    Comment: (NOTE) SARS-CoV-2 target nucleic acids are NOT DETECTED.  The SARS-CoV-2 RNA is generally detectable in upper and lower respiratory specimens during the acute phase of infection. Negative results do not preclude SARS-CoV-2 infection, do not rule out co-infections with other pathogens, and should not be used as the sole basis for treatment or other patient management decisions. Negative results must be combined with clinical observations, patient history, and epidemiological information. The expected result is Negative.  Fact Sheet for Patients: SugarRoll.be  Fact Sheet for Healthcare Providers: https://www.woods-mathews.com/  This test is not yet approved or cleared by the [place] FDA and  has been authorized for detection and/or diagnosis of SARS-CoV-2 by FDA under an Emergency Use Authorization (EUA). This EUA will remain  in effect (meaning this test can be used) for the duration of the COVID-19 declaration under Se ction 564(b)(1) of the Act, 21 U.S.C. section 360bbb-3(b)(1), unless the authorization is terminated or revoked sooner.  Performed at [facility], [place] [address] [place], [place] [address]     Labs: CBC: Recent Labs  Lab 03/10/22 1720  WBC 6.3  NEUTROABS 3.2  HGB 14.1  HCT 40.1  MCV 90.5  PLT 341   Basic Metabolic Panel: Recent Labs  Lab 03/10/22 1720  NA 138  K 3.9  CL 104  CO2 27  GLUCOSE 108*  BUN 15  CREATININE 1.10  CALCIUM 8.7*   Liver Function Tests: Recent Labs  Lab 03/10/22 1720  AST 33  ALT 37  ALKPHOS 136*  BILITOT 0.7  PROT 7.2  ALBUMIN 3.7   CBG: Recent Labs  Lab 03/10/22 1631  GLUCAP 113*    Discharge time spent: {LESS THAN/GREATER THAN:26388} 30 minutes.  Signed: Ezekiel Slocumb, DO [facility] 03/12/2022

## 2022-03-12 NOTE — Progress Notes (Signed)
Report received from [facility]. Pt sitting up in recliner awaiting lunch tray, states he's ready for d/c. No questions or concerned stated. Family arrived to bedside after handoff report. Pt states he will ring call bell for transport out to car once eating lunch.

## 2022-03-13 LAB — ECHOCARDIOGRAM COMPLETE
Area-P 1/2: 2.71 cm2
Height: 73 in
MV VTI: 4.14 cm2
S' Lateral: 3.4 cm
Weight: 3760.17 oz

## 2022-03-18 ENCOUNTER — Ambulatory Visit: Payer: 59 | Attending: Infectious Diseases

## 2022-03-18 DIAGNOSIS — M6281 Muscle weakness (generalized): Secondary | ICD-10-CM | POA: Diagnosis present

## 2022-03-18 DIAGNOSIS — R278 Other lack of coordination: Secondary | ICD-10-CM | POA: Insufficient documentation

## 2022-03-18 DIAGNOSIS — R262 Difficulty in walking, not elsewhere classified: Secondary | ICD-10-CM | POA: Insufficient documentation

## 2022-03-18 DIAGNOSIS — R2689 Other abnormalities of gait and mobility: Secondary | ICD-10-CM | POA: Diagnosis present

## 2022-03-18 NOTE — Therapy (Signed)
OUTPATIENT PHYSICAL THERAPY NEURO EVALUATION   Patient Name: Carlos Nolan MRN: [id number] DOB:[birth year], 56 y.o., male Today's Date: 03/18/2022   PCP: Leonel Ramsay, MD REFERRING PROVIDER: Leonel Ramsay, MD   PT End of Session - 03/18/22 1113     Visit Number 1    Number of Visits 24    Date for PT Re-Evaluation 04/01/22    Progress Note Due on Visit 10    PT Start Time 0932    PT Stop Time 1025    PT Time Calculation (min) 53 min    Equipment Utilized During Treatment Gait belt    Activity Tolerance Patient tolerated treatment well    Behavior During Therapy [facility] for tasks assessed/performed             Past Medical History:  Diagnosis Date   Atrial fibrillation [facility])    COVID-19 01/2019   Sept 2020   Past Surgical History:  Procedure Laterality Date   APPENDECTOMY     CARDIAC ELECTROPHYSIOLOGY STUDY AND ABLATION  04/24/2015   COLONOSCOPY WITH PROPOFOL N/A 08/11/2020   Procedure: COLONOSCOPY WITH PROPOFOL;  Surgeon: Wilford Corner, MD;  Location: [facility];  Service: Endoscopy;  Laterality: N/A;   ELBOW FRACTURE SURGERY     ESOPHAGOGASTRODUODENOSCOPY N/A 01/11/2016   Procedure: ESOPHAGOGASTRODUODENOSCOPY (EGD);  Surgeon: Mauri Pole, MD;  Location: [facility];  Service: Endoscopy;  Laterality: N/A;   FEMUR FRACTURE SURGERY     HARDWARE REMOVAL Left 08/22/2019   Procedure: HARDWARE REMOVAL FROM LEFT ELBOW;  Surgeon: Corky Mull, MD;  Location: [facility] ORS;  Service: Orthopedics;  Laterality: Left;   LOOP RECORDER INSERTION N/A 01/21/2020   Procedure: LOOP RECORDER INSERTION;  Surgeon: Isaias Cowman, MD;  Location: [place] CV [facility];  Service: Cardiovascular;  Laterality: N/A;   POLYPECTOMY  08/11/2020   Procedure: POLYPECTOMY;  Surgeon: Wilford Corner, MD;  [facility];  Service: Endoscopy;;   SHOULDER ARTHROSCOPY Right 06/15/2016   Procedure: Arthroscopic labral debridement, arthroscopic subacromial decompression, and  mini-open bursectomy with exploration of rotator cuff, right shoulder;  Surgeon: Corky Mull, MD;  Location: [place];  Service: Orthopedics;  Laterality: Right;   Patient Active Problem List   Diagnosis Date Noted   Acute ischemic stroke ([place]) 03/10/2022   Essential hypertension 03/10/2022   Hyperlipidemia 03/10/2022   Atrial fibrillation, chronic ([place]) 03/10/2022   History of adenomatous polyp of colon 08/11/2020   Stroke (cerebrum) ([place]) 12/26/2019   Acute thalamic infarction ([place]) 12/25/2019   S/P ablation of atrial fibrillation 12/25/2019   Food impaction of esophagus     ONSET DATE: 03/10/22  REFERRING DIAG:   U23.53 (ICD-10-CM) - Personal history of transient ischemic attack (TIA), and cerebral infarction without residual deficits  I69.90 (ICD-10-CM) - Unspecified sequelae of unspecified cerebrovascular disease    THERAPY DIAG:  Difficulty in walking, not elsewhere classified  Muscle weakness (generalized)  Imbalance  Other abnormalities of gait and mobility  Rationale for Evaluation and Treatment: Rehabilitation  SUBJECTIVE:  SUBJECTIVE STATEMENT:  Pt reports he was in [facility] for 2 days before being discharged after having a stroke.  Pt reports having 2 strokes in the past 2 years at this time.   Pt accompanied by: self  PERTINENT HISTORY: Pt is a 56 year old male with history of lacunar infarct with left upper extremity weakness and baseline difficulty walking, hypertension, currently on Xarelto, neuropathy, hyperlipidemia chronic back pain neck pain with numbness and tingling who presents emergency department for chief concerns of left-sided weakness. Per MRI impression: "2 cm acute ischemic nonhemorrhagic infarct involving the right  frontal centrum semi ovale".   PAIN:   Are you having pain? No  PRECAUTIONS: None  WEIGHT BEARING RESTRICTIONS: No  FALLS: Has patient fallen in last 6 months? No  LIVING ENVIRONMENT: Lives with: lives with their spouse Lives in: House/apartment Stairs: Yes: Internal: 13 steps; on right going up and External: 4 steps; can reach both Has following equipment at home: Single point cane  PLOF: Requires assistive device for independence  PATIENT GOALS:   Pt states he wants to be able walk without a limp and a without a cane.  Pt wants to be able to walk up/down stairs with increased confidence and lack of fear of falling.  Pt wants to be able to get back to being able to ride the bike and play golf again.   OBJECTIVE:   DIAGNOSTIC FINDINGS:   EXAM: MRI HEAD WITHOUT CONTRAST  IMPRESSION: 1. 2 cm acute ischemic nonhemorrhagic infarct involving the right frontal centrum semi ovale. 2. Underlying moderately advanced chronic microvascular ischemic disease with a few scattered remote lacunar infarcts about the hemispheric cerebral white matter and thalami.    COGNITION: Overall cognitive status: Within functional limits for tasks assessed   SENSATION: Decreased sensation on the dorsum of the foot and along the medial portion of the L calf  COORDINATION: Pt has coordination deficits when performing the heel-shin method on the L side Pt has coordination deficits when performing the rapid supination/pronation on the L side Decreased thumb to finger rapid touch on the L side  POSTURE: No Significant postural limitations  LOWER EXTREMITY ROM:     Active  Right Eval Left Eval  Hip flexion    Hip extension    Hip abduction    Hip adduction    Hip internal rotation    Hip external rotation    Knee flexion    Knee extension    Ankle dorsiflexion    Ankle plantarflexion    Ankle inversion    Ankle eversion     (Blank rows = not tested)  LOWER EXTREMITY MMT:    MMT Right Eval Left Eval  Hip flexion 5  4-  Hip extension    Hip abduction    Hip adduction    Hip internal rotation    Hip external rotation    Knee flexion 5 4  Knee extension 5 4-  Ankle dorsiflexion 5 3+  Ankle plantarflexion    Ankle inversion    Ankle eversion    (Blank rows = not tested)  TRANSFERS: Assistive device utilized: Single point cane  Sit to stand: Modified independence Stand to sit: Modified independence Chair to chair: Modified independence  GAIT: Gait pattern: decreased arm swing- Left, decreased step length- Right, decreased stance time- Left, decreased stride length, decreased hip/knee flexion- Left, decreased ankle dorsiflexion- Left, and poor foot clearance- Left Distance walked: 62f Assistive device utilized: None Level of assistance: SBA Comments: Pt stable, just weak  on the L side.  FUNCTIONAL TESTS:  5 times sit to stand: 35.72 Timed up and go (TUG): 18.82 sec without AD 6 minute walk test: Assess at next visit 10 meter walk test: 16.18 Functional gait assessment: Assess at next visit  PATIENT SURVEYS:  FOTO 41/59  TODAY'S TREATMENT:                                                                                                                              DATE: 03/18/22   EVAL only  PATIENT EDUCATION: Education details: Pt educated on role of PT and services provided during current POC, along with prognosis and information about the clinic. Person educated: Patient Education method: Explanation Education comprehension: verbalized understanding  HOME EXERCISE PROGRAM:  Give to pt at next visit  GOALS: Goals reviewed with patient? Yes  SHORT TERM GOALS: Target date: 04/15/2022  Pt will be independent with HEP in order to demonstrate increased ability to perform tasks related to occupation/hobbies. Baseline: Goal status: INITIAL  LONG TERM GOALS: Target date: 06/10/2022  1.  Patient (> 55 years old) will complete five times sit to stand test in < 15 seconds indicating  an increased LE strength and improved balance. Baseline: 35.72 Goal status: INITIAL  2.  Patient will increase FOTO score to equal to or greater than 59 to demonstrate statistically significant improvement in mobility and quality of life.  Baseline: 41 Goal status: INITIAL   3.  Patient will increase FGA score by > 4 points to demonstrate decreased fall risk during functional activities. Baseline: Not assessed at Eval Goal status: INITIAL   4.  Patient will reduce timed up and go to <11 seconds to reduce fall risk and demonstrate improved transfer/gait ability. Baseline: 18.82 sec Goal status: INITIAL  5.  Patient will increase 10 meter walk test to >1.60ms as to improve gait speed for better community ambulation and to reduce fall risk. Baseline: 16.18 sec Goal status: INITIAL  6.  Patient will increase six minute walk test distance to >1000 for progression to community ambulator and improve gait ability Baseline: Not assessed at initial evaluation Goal status: INITIAL    ASSESSMENT:  CLINICAL IMPRESSION: Patient is a 56y.o. male who was seen today for physical therapy evaluation and treatment for s/p stroke.  Pt presents with physical impairments of decreased activity tolerance, decreased balance, and decreased strength in L LE as noted.  Pt will benefit from skilled therapy to address tolerance, balance, and strength impairments necessary for improvement in quality of life.  Pt. demonstrates understanding of this plan of care and agrees with this plan.    OBJECTIVE IMPAIRMENTS: Abnormal gait, decreased activity tolerance, decreased balance, decreased coordination, decreased mobility, difficulty walking, decreased strength, and impaired UE functional use.   ACTIVITY LIMITATIONS: carrying, lifting, bending, standing, squatting, transfers, bed mobility, bathing, dressing, and locomotion level  PARTICIPATION LIMITATIONS: cleaning, laundry, shopping, community activity, occupation,  and yard work  PERSONAL FACTORS: Age,  Past/current experiences, Time since onset of injury/illness/exacerbation, and 1-2 comorbidities: stroke, Afib  are also affecting patient's functional outcome.   REHAB POTENTIAL: Good  CLINICAL DECISION MAKING: Stable/uncomplicated  EVALUATION COMPLEXITY: Moderate  PLAN:  PT FREQUENCY: 2x/week  PT DURATION: 12 weeks  PLANNED INTERVENTIONS: Therapeutic exercises, Therapeutic activity, Neuromuscular re-education, Balance training, Gait training, Patient/Family education, Self Care, Joint mobilization, Vestibular training, Canalith repositioning, Dry Needling, Spinal mobilization, Cryotherapy, Moist heat, and Manual therapy  PLAN FOR NEXT SESSION: Give HEP and finish goal assessment from evaluation; 6MWT, FGA.    Gwenlyn Saran, PT, DPT Physical Therapist- [facility]  03/18/22, 11:44 AM

## 2022-03-21 ENCOUNTER — Ambulatory Visit: Payer: 59

## 2022-03-21 DIAGNOSIS — R2689 Other abnormalities of gait and mobility: Secondary | ICD-10-CM

## 2022-03-21 DIAGNOSIS — M6281 Muscle weakness (generalized): Secondary | ICD-10-CM

## 2022-03-21 DIAGNOSIS — R262 Difficulty in walking, not elsewhere classified: Secondary | ICD-10-CM | POA: Diagnosis not present

## 2022-03-21 NOTE — Therapy (Signed)
OUTPATIENT PHYSICAL THERAPY NEURO TREATMENT   Patient Name: Carlos Nolan MRN: [id number] DOB:1965/05/24, 56 y.o., male Today's Date: 03/21/2022   PCP: Leonel Ramsay, MD REFERRING PROVIDER: Leonel Ramsay, MD   PT End of Session - 03/21/22 0813     Visit Number 2    Number of Visits 24    Date for PT Re-Evaluation 04/01/22    Progress Note Due on Visit 10    PT Start Time 0802    PT Stop Time 0845    PT Time Calculation (min) 43 min    Equipment Utilized During Treatment Gait belt    Activity Tolerance Patient tolerated treatment well    Behavior During Therapy [facility] for tasks assessed/performed             Past Medical History:  Diagnosis Date   Atrial fibrillation [facility])    COVID-19 01/2019   Sept 2020   Past Surgical History:  Procedure Laterality Date   APPENDECTOMY     CARDIAC ELECTROPHYSIOLOGY STUDY AND ABLATION  04/24/2015   COLONOSCOPY WITH PROPOFOL N/A 08/11/2020   Procedure: COLONOSCOPY WITH PROPOFOL;  Surgeon: Wilford Corner, MD;  [facility];  Service: Endoscopy;  Laterality: N/A;   ELBOW FRACTURE SURGERY     ESOPHAGOGASTRODUODENOSCOPY N/A 01/11/2016   Procedure: ESOPHAGOGASTRODUODENOSCOPY (EGD);  Surgeon: Mauri Pole, MD;  Location: [facility];  Service: Endoscopy;  Laterality: N/A;   FEMUR FRACTURE SURGERY     HARDWARE REMOVAL Left 08/22/2019   Procedure: HARDWARE REMOVAL FROM LEFT ELBOW;  Surgeon: Corky Mull, MD;  Location: [facility];  Service: Orthopedics;  Laterality: Left;   LOOP RECORDER INSERTION N/A 01/21/2020   Procedure: LOOP RECORDER INSERTION;  Surgeon: Isaias Cowman, MD;  Location: [place] CV LAB;  Service: Cardiovascular;  Laterality: N/A;   POLYPECTOMY  08/11/2020   Procedure: POLYPECTOMY;  Surgeon: Wilford Corner, MD;  [facility];  Service: Endoscopy;;   SHOULDER ARTHROSCOPY Right 06/15/2016   Procedure: Arthroscopic labral debridement, arthroscopic subacromial decompression, and  mini-open bursectomy with exploration of rotator cuff, right shoulder;  Surgeon: Corky Mull, MD;  Location: [place];  Service: Orthopedics;  Laterality: Right;   Patient Active Problem List   Diagnosis Date Noted   Acute ischemic stroke ([place]) 03/10/2022   Essential hypertension 03/10/2022   Hyperlipidemia 03/10/2022   Atrial fibrillation, chronic ([place]) 03/10/2022   History of adenomatous polyp of colon 08/11/2020   Stroke (cerebrum) ([place]) 12/26/2019   Acute thalamic infarction ([place]) 12/25/2019   S/P ablation of atrial fibrillation 12/25/2019   Food impaction of esophagus     ONSET DATE: 03/10/22  REFERRING DIAG:   C94.49 (ICD-10-CM) - Personal history of transient ischemic attack (TIA), and cerebral infarction without residual deficits  I69.90 (ICD-10-CM) - Unspecified sequelae of unspecified cerebrovascular disease    THERAPY DIAG:  Difficulty in walking, not elsewhere classified  Muscle weakness (generalized)  Imbalance  Other abnormalities of gait and mobility  Rationale for Evaluation and Treatment: Rehabilitation  SUBJECTIVE:  SUBJECTIVE STATEMENT:  Pt reports he had an uneventful weekend.  Pt reports he is still unsure of himself during walking due to the lack of feeling in the L LE.  PERTINENT HISTORY: Pt is a 56 year old male with history of lacunar infarct with left upper extremity weakness and baseline difficulty walking, hypertension, currently on Xarelto, neuropathy, hyperlipidemia chronic back pain neck pain with numbness and tingling who presents emergency department for chief concerns of left-sided weakness. Per MRI impression: "2 cm acute ischemic nonhemorrhagic infarct involving the right  frontal centrum semi ovale".    PAIN:  Are you having pain? Yes, 5/10  in L foot, hand, and neck.   PRECAUTIONS: None  WEIGHT BEARING RESTRICTIONS: No  FALLS: Has patient fallen in last 6 months? No  LIVING ENVIRONMENT: Lives with: lives with their spouse Lives in: House/apartment Stairs: Yes: Internal: 13 steps; on right going up and External: 4 steps; can reach both Has following equipment at home: Single point cane  PLOF: Requires assistive device for independence   PATIENT GOALS:   Pt states he wants to be able walk without a limp and a without a cane.  Pt wants to be able to walk up/down stairs with increased confidence and lack of fear of falling.  Pt wants to be able to get back to being able to ride the bike and play golf again.   OBJECTIVE:   DIAGNOSTIC FINDINGS:   EXAM: MRI HEAD WITHOUT CONTRAST  IMPRESSION: 1. 2 cm acute ischemic nonhemorrhagic infarct involving the right frontal centrum semi ovale. 2. Underlying moderately advanced chronic microvascular ischemic disease with a few scattered remote lacunar infarcts about the hemispheric cerebral white matter and thalami.    COGNITION: Overall cognitive status: Within functional limits for tasks assessed   SENSATION: Decreased sensation on the dorsum of the foot and along the medial portion of the L calf  COORDINATION: Pt has coordination deficits when performing the heel-shin method on the L side Pt has coordination deficits when performing the rapid supination/pronation on the L side Decreased thumb to finger rapid touch on the L side  POSTURE: No Significant postural limitations  LOWER EXTREMITY ROM:     Active  Right Eval Left Eval  Hip flexion    Hip extension    Hip abduction    Hip adduction    Hip internal rotation    Hip external rotation    Knee flexion    Knee extension    Ankle dorsiflexion    Ankle plantarflexion    Ankle inversion    Ankle eversion     (Blank rows = not tested)  LOWER EXTREMITY MMT:    MMT Right Eval Left Eval  Hip  flexion 5 4-  Hip extension    Hip abduction    Hip adduction    Hip internal rotation    Hip external rotation    Knee flexion 5 4  Knee extension 5 4-  Ankle dorsiflexion 5 3+  Ankle plantarflexion    Ankle inversion    Ankle eversion    (Blank rows = not tested)  TRANSFERS: Assistive device utilized: Single point cane  Sit to stand: Modified independence Stand to sit: Modified independence Chair to chair: Modified independence  GAIT: Gait pattern: decreased arm swing- Left, decreased step length- Right, decreased stance time- Left, decreased stride length, decreased hip/knee flexion- Left, decreased ankle dorsiflexion- Left, and poor foot clearance- Left Distance walked: 58f Assistive device utilized: None Level of assistance: SBA Comments: Pt stable,  just weak on the L side.  FUNCTIONAL TESTS:  5 times sit to stand: 35.72 Timed up and go (TUG): 18.82 sec without AD 6 minute walk test: 535 ft 10 meter walk test: 16.18 Functional gait assessment: Assess at next visit  PATIENT SURVEYS:  FOTO 41/59  TODAY'S TREATMENT:                                                                                                                              DATE: 03/18/22   TherEx:  Seated hamstring stretch, 30 sec bouts each LE Seated hamstring stretch with use of belt around the forefoot for increased stretch, 30 sec bouts each LE  Continued with goal assessment as noted below  Pt with + Thessaly's Test, Medial Joint Line Tenderness, and + Step Down Test; all clinically significant for potential meniscal tear.  PATIENT EDUCATION: Education details: Pt educated on role of PT and services provided during current POC, along with prognosis and information about the clinic. Person educated: Patient Education method: Explanation Education comprehension: verbalized understanding  HOME EXERCISE PROGRAM:  Give to pt at next visit  GOALS: Goals reviewed with patient? Yes  SHORT  TERM GOALS: Target date: 04/18/2022  Pt will be independent with HEP in order to demonstrate increased ability to perform tasks related to occupation/hobbies. Baseline: Goal status: INITIAL  LONG TERM GOALS: Target date: 06/10/2022  1.  Patient (> 61 years old) will complete five times sit to stand test in < 15 seconds indicating an increased LE strength and improved balance. Baseline: 35.72 Goal status: INITIAL  2.  Patient will increase FOTO score to equal to or greater than 59 to demonstrate statistically significant improvement in mobility and quality of life.  Baseline: 41 Goal status: INITIAL   3.  Patient will increase FGA score by > 4 points to demonstrate decreased fall risk during functional activities. Baseline: Not assessed at Eval Goal status: INITIAL   4.  Patient will reduce timed up and go to <11 seconds to reduce fall risk and demonstrate improved transfer/gait ability. Baseline: 18.82 sec Goal status: INITIAL  5.  Patient will increase 10 meter walk test to >1.1ms as to improve gait speed for better community ambulation and to reduce fall risk. Baseline: 16.18 sec Goal status: INITIAL  6.  Patient will increase six minute walk test distance to >1000 for progression to community ambulator and improve gait ability Baseline: 535 ft Goal status: INITIAL    ASSESSMENT:  CLINICAL IMPRESSION:  Pt performed well with the goal assessment that continued from initial evaluation.  Pt states that during some of the testing he was experiencing significant L knee pain from a week or so ago when he twisted his knee.  Pt evaluated and found to have 3 positive signs that are indicative of a meniscal pathology.  Pt advised to seek orthopaedic consult if present issue continued, pt agreed.   Pt will continue to benefit from skilled therapy  to address remaining deficits in order to improve overall QoL and return to PLOF.       OBJECTIVE IMPAIRMENTS: Abnormal gait, decreased  activity tolerance, decreased balance, decreased coordination, decreased mobility, difficulty walking, decreased strength, and impaired UE functional use.   ACTIVITY LIMITATIONS: carrying, lifting, bending, standing, squatting, transfers, bed mobility, bathing, dressing, and locomotion level  PARTICIPATION LIMITATIONS: cleaning, laundry, shopping, community activity, occupation, and yard work  PERSONAL FACTORS: Age, Past/current experiences, Time since onset of injury/illness/exacerbation, and 1-2 comorbidities: stroke, Afib  are also affecting patient's functional outcome.   REHAB POTENTIAL: Good  CLINICAL DECISION MAKING: Stable/uncomplicated  EVALUATION COMPLEXITY: Moderate  PLAN:  PT FREQUENCY: 2x/week  PT DURATION: 12 weeks  PLANNED INTERVENTIONS: Therapeutic exercises, Therapeutic activity, Neuromuscular re-education, Balance training, Gait training, Patient/Family education, Self Care, Joint mobilization, Vestibular training, Canalith repositioning, Dry Needling, Spinal mobilization, Cryotherapy, Moist heat, and Manual therapy  PLAN FOR NEXT SESSION: Give HEP.    Gwenlyn Saran, PT, DPT Physical Therapist- [facility]  03/21/22, 8:14 AM

## 2022-03-22 NOTE — Therapy (Signed)
OUTPATIENT PHYSICAL THERAPY NEURO TREATMENT   Patient Name: Carlos Nolan MRN: [id number] DOB:11-20-1965, 56 y.o., male Today's Date: 03/22/2022   PCP: Leonel Ramsay, MD REFERRING PROVIDER: Leonel Ramsay, MD     Past Medical History:  Diagnosis Date   Atrial fibrillation [facility])    COVID-19 01/2019   Sept 2020   Past Surgical History:  Procedure Laterality Date   APPENDECTOMY     CARDIAC ELECTROPHYSIOLOGY STUDY AND ABLATION  04/24/2015   COLONOSCOPY WITH PROPOFOL N/A 08/11/2020   Procedure: COLONOSCOPY WITH PROPOFOL;  Surgeon: Wilford Corner, MD;  Location: [facility];  Service: Endoscopy;  Laterality: N/A;   ELBOW FRACTURE SURGERY     ESOPHAGOGASTRODUODENOSCOPY N/A 01/11/2016   Procedure: ESOPHAGOGASTRODUODENOSCOPY (EGD);  Surgeon: Mauri Pole, MD;  Location: [facility];  Service: Endoscopy;  Laterality: N/A;   FEMUR FRACTURE SURGERY     HARDWARE REMOVAL Left 08/22/2019   Procedure: HARDWARE REMOVAL FROM LEFT ELBOW;  Surgeon: Corky Mull, MD;  Location: [facility];  Service: Orthopedics;  Laterality: Left;   LOOP RECORDER INSERTION N/A 01/21/2020   Procedure: LOOP RECORDER INSERTION;  Surgeon: Isaias Cowman, MD;  Location: [place] [facility];  Service: Cardiovascular;  Laterality: N/A;   POLYPECTOMY  08/11/2020   Procedure: POLYPECTOMY;  Surgeon: Wilford Corner, MD;  [facility]: [facility];  Service: Endoscopy;;   SHOULDER ARTHROSCOPY Right 06/15/2016   Procedure: Arthroscopic labral debridement, arthroscopic subacromial decompression, and mini-open bursectomy with exploration of rotator cuff, right shoulder;  Surgeon: Corky Mull, MD;  Location: [facility];  Service: Orthopedics;  Laterality: Right;   Patient Active Problem List   Diagnosis Date Noted   Acute ischemic stroke ([place]) 03/10/2022   Essential hypertension 03/10/2022   Hyperlipidemia 03/10/2022   Atrial fibrillation, chronic ([name]) 03/10/2022   History of adenomatous  polyp of colon 08/11/2020   Stroke (cerebrum) ([place]) 12/26/2019   Acute thalamic infarction ([place]) 12/25/2019   S/P ablation of atrial fibrillation 12/25/2019   Food impaction of esophagus     ONSET DATE: 03/10/22  REFERRING DIAG:   W09.81 (ICD-10-CM) - Personal history of transient ischemic attack (TIA), and cerebral infarction without residual deficits  I69.90 (ICD-10-CM) - Unspecified sequelae of unspecified cerebrovascular disease    THERAPY DIAG:  No diagnosis found.  Rationale for Evaluation and Treatment: Rehabilitation  SUBJECTIVE:                                                                                                                                                                                             SUBJECTIVE STATEMENT: *** Pt reports he had an uneventful weekend.  Pt reports  he is still unsure of himself during walking due to the lack of feeling in the L LE.  PERTINENT HISTORY: Pt is a 56 year old male with history of lacunar infarct with left upper extremity weakness and baseline difficulty walking, hypertension, currently on Xarelto, neuropathy, hyperlipidemia chronic back pain neck pain with numbness and tingling who presents emergency department for chief concerns of left-sided weakness. Per MRI impression: "2 cm acute ischemic nonhemorrhagic infarct involving the right  frontal centrum semi ovale".    PAIN:  Are you having pain? Yes, 5/10 in L foot, hand, and neck.   PRECAUTIONS: None  WEIGHT BEARING RESTRICTIONS: No  FALLS: Has patient fallen in last 6 months? No  LIVING ENVIRONMENT: Lives with: lives with their spouse Lives in: House/apartment Stairs: Yes: Internal: 13 steps; on right going up and External: 4 steps; can reach both Has following equipment at home: Single point cane  PLOF: Requires assistive device for independence   PATIENT GOALS:   Pt states he wants to be able walk without a limp and a without a cane.  Pt wants to be  able to walk up/down stairs with increased confidence and lack of fear of falling.  Pt wants to be able to get back to being able to ride the bike and play golf again.   OBJECTIVE:   DIAGNOSTIC FINDINGS:   EXAM: MRI HEAD WITHOUT CONTRAST  IMPRESSION: 1. 2 cm acute ischemic nonhemorrhagic infarct involving the right frontal centrum semi ovale. 2. Underlying moderately advanced chronic microvascular ischemic disease with a few scattered remote lacunar infarcts about the hemispheric cerebral white matter and thalami.    COGNITION: Overall cognitive status: Within functional limits for tasks assessed   SENSATION: Decreased sensation on the dorsum of the foot and along the medial portion of the L calf  COORDINATION: Pt has coordination deficits when performing the heel-shin method on the L side Pt has coordination deficits when performing the rapid supination/pronation on the L side Decreased thumb to finger rapid touch on the L side  POSTURE: No Significant postural limitations  LOWER EXTREMITY ROM:     Active  Right Eval Left Eval  Hip flexion    Hip extension    Hip abduction    Hip adduction    Hip internal rotation    Hip external rotation    Knee flexion    Knee extension    Ankle dorsiflexion    Ankle plantarflexion    Ankle inversion    Ankle eversion     (Blank rows = not tested)  LOWER EXTREMITY MMT:    MMT Right Eval Left Eval  Hip flexion 5 4-  Hip extension    Hip abduction    Hip adduction    Hip internal rotation    Hip external rotation    Knee flexion 5 4  Knee extension 5 4-  Ankle dorsiflexion 5 3+  Ankle plantarflexion    Ankle inversion    Ankle eversion    (Blank rows = not tested)  TRANSFERS: Assistive device utilized: Single point cane  Sit to stand: Modified independence Stand to sit: Modified independence Chair to chair: Modified independence  GAIT: Gait pattern: decreased arm swing- Left, decreased step length- Right,  decreased stance time- Left, decreased stride length, decreased hip/knee flexion- Left, decreased ankle dorsiflexion- Left, and poor foot clearance- Left Distance walked: 85f Assistive device utilized: None Level of assistance: SBA Comments: Pt stable, just weak on the L side.  FUNCTIONAL TESTS:  5 times sit  to stand: 35.72 Timed up and go (TUG): 18.82 sec without AD 6 minute walk test: 535 ft 10 meter walk test: 16.18 Functional gait assessment: 17/30  PATIENT SURVEYS:  FOTO 41/59  TODAY'S TREATMENT:                                 *** HEP  TherEx:  Seated hamstring stretch, 30 sec bouts each LE Seated hamstring stretch with use of belt around the forefoot for increased stretch, 30 sec bouts each LE  Continued with goal assessment as noted below  Pt with + Thessaly's Test, Medial Joint Line Tenderness, and + Step Down Test; all clinically significant for potential meniscal tear.  PATIENT EDUCATION: Education details: Pt educated on role of PT and services provided during current POC, along with prognosis and information about the clinic. Person educated: Patient Education method: Explanation Education comprehension: verbalized understanding  HOME EXERCISE PROGRAM:  Give to pt at next visit  GOALS: Goals reviewed with patient? Yes  SHORT TERM GOALS: Target date: 04/19/2022  Pt will be independent with HEP in order to demonstrate increased ability to perform tasks related to occupation/hobbies. Baseline: Goal status: INITIAL  LONG TERM GOALS: Target date: 06/10/2022  1.  Patient (> 60 years old) will complete five times sit to stand test in < 15 seconds indicating an increased LE strength and improved balance. Baseline: 35.72 Goal status: INITIAL  2.  Patient will increase FOTO score to equal to or greater than 59 to demonstrate statistically significant improvement in mobility and quality of life.  Baseline: 41 Goal status: INITIAL   3.  Patient will increase  FGA score by > 4 points to demonstrate decreased fall risk during functional activities. Baseline: 17/30 Goal status: INITIAL   4.  Patient will reduce timed up and go to <11 seconds to reduce fall risk and demonstrate improved transfer/gait ability. Baseline: 18.82 sec Goal status: INITIAL  5.  Patient will increase 10 meter walk test to >1.82ms as to improve gait speed for better community ambulation and to reduce fall risk. Baseline: 16.18 sec Goal status: INITIAL  6.  Patient will increase six minute walk test distance to >1000 for progression to community ambulator and improve gait ability Baseline: 535 ft Goal status: INITIAL    ASSESSMENT:  CLINICAL IMPRESSION: *** Pt performed well with the goal assessment that continued from initial evaluation.  Pt states that during some of the testing he was experiencing significant L knee pain from a week or so ago when he twisted his knee.  Pt evaluated and found to have 3 positive signs that are indicative of a meniscal pathology.  Pt advised to seek orthopaedic consult if present issue continued, pt agreed.   Pt will continue to benefit from skilled therapy to address remaining deficits in order to improve overall QoL and return to PLOF.       OBJECTIVE IMPAIRMENTS: Abnormal gait, decreased activity tolerance, decreased balance, decreased coordination, decreased mobility, difficulty walking, decreased strength, and impaired UE functional use.   ACTIVITY LIMITATIONS: carrying, lifting, bending, standing, squatting, transfers, bed mobility, bathing, dressing, and locomotion level  PARTICIPATION LIMITATIONS: cleaning, laundry, shopping, community activity, occupation, and yard work  PERSONAL FACTORS: Age, Past/current experiences, Time since onset of injury/illness/exacerbation, and 1-2 comorbidities: stroke, Afib  are also affecting patient's functional outcome.   REHAB POTENTIAL: Good  CLINICAL DECISION MAKING:  Stable/uncomplicated  EVALUATION COMPLEXITY: Moderate  PLAN:  PT  FREQUENCY: 2x/week  PT DURATION: 12 weeks  PLANNED INTERVENTIONS: Therapeutic exercises, Therapeutic activity, Neuromuscular re-education, Balance training, Gait training, Patient/Family education, Self Care, Joint mobilization, Vestibular training, Canalith repositioning, Dry Needling, Spinal mobilization, Cryotherapy, Moist heat, and Manual therapy  PLAN FOR NEXT SESSION: Give HEP.    Gwenlyn Saran, PT, DPT Physical Therapist- [facility]  03/22/22, 11:13 PM

## 2022-03-23 ENCOUNTER — Ambulatory Visit: Payer: 59

## 2022-03-23 DIAGNOSIS — R262 Difficulty in walking, not elsewhere classified: Secondary | ICD-10-CM

## 2022-03-23 DIAGNOSIS — M6281 Muscle weakness (generalized): Secondary | ICD-10-CM

## 2022-03-23 DIAGNOSIS — R278 Other lack of coordination: Secondary | ICD-10-CM

## 2022-03-23 DIAGNOSIS — R2689 Other abnormalities of gait and mobility: Secondary | ICD-10-CM

## 2022-03-23 NOTE — Therapy (Signed)
OUTPATIENT OCCUPATIONAL THERAPY NEURO EVALUATION  Patient Name: Carlos Nolan MRN: [id number] DOB:1966-04-05, 56 y.o., male Today's Date: 03/23/2022  PCP: Dr. Leonel Ramsay (PCP) REFERRING PROVIDER: Dr. Gurney Maxin (Neurologist)   OT End of Session - 03/23/22 0934     Visit Number 1    Number of Visits 24    Date for OT Re-Evaluation 06/15/22    OT Start Time 0930    OT Stop Time 1030    OT Time Calculation (min) 60 min    Activity Tolerance Patient tolerated treatment well    Behavior During Therapy [facility] for tasks assessed/performed             Past Medical History:  Diagnosis Date   Atrial fibrillation [facility])    COVID-19 01/2019   Sept 2020   Past Surgical History:  Procedure Laterality Date   APPENDECTOMY     CARDIAC ELECTROPHYSIOLOGY STUDY AND ABLATION  04/24/2015   COLONOSCOPY WITH PROPOFOL N/A 08/11/2020   Procedure: COLONOSCOPY WITH PROPOFOL;  Surgeon: Wilford Corner, MD;  [facility];  Service: Endoscopy;  Laterality: N/A;   ELBOW FRACTURE SURGERY     ESOPHAGOGASTRODUODENOSCOPY N/A 01/11/2016   Procedure: ESOPHAGOGASTRODUODENOSCOPY (EGD);  Surgeon: Mauri Pole, MD;  Location: [facility];  Service: Endoscopy;  Laterality: N/A;   FEMUR FRACTURE SURGERY     HARDWARE REMOVAL Left 08/22/2019   Procedure: HARDWARE REMOVAL FROM LEFT ELBOW;  Surgeon: Corky Mull, MD;  Location: [facility];  Service: Orthopedics;  Laterality: Left;   LOOP RECORDER INSERTION N/A 01/21/2020   Procedure: LOOP RECORDER INSERTION;  Surgeon: Isaias Cowman, MD;  Location: [place] [facility];  Service: Cardiovascular;  Laterality: N/A;   POLYPECTOMY  08/11/2020   Procedure: POLYPECTOMY;  Surgeon: Wilford Corner, MD;  [facility];  Service: Endoscopy;;   SHOULDER ARTHROSCOPY Right 06/15/2016   Procedure: Arthroscopic labral debridement, arthroscopic subacromial decompression, and mini-open bursectomy with exploration of rotator cuff, right shoulder;   Surgeon: Corky Mull, MD;  Location: [place];  Service: Orthopedics;  Laterality: Right;   Patient Active Problem List   Diagnosis Date Noted   Acute ischemic stroke ([place]) 03/10/2022   Essential hypertension 03/10/2022   Hyperlipidemia 03/10/2022   Atrial fibrillation, chronic ([place]) 03/10/2022   History of adenomatous polyp of colon 08/11/2020   Stroke (cerebrum) ([place]) 12/26/2019   Acute thalamic infarction ([place]) 12/25/2019   S/P ablation of atrial fibrillation 12/25/2019   Food impaction of esophagus     ONSET DATE: 03/10/22  REFERRING DIAG: CVA with L sided weakness  THERAPY DIAG:  Muscle weakness (generalized)  Other lack of coordination  Rationale for Evaluation and Treatment: Rehabilitation  SUBJECTIVE:   SUBJECTIVE STATEMENT: Pt reports that the further he reaches, the more difficult it is to control his arm. Pt accompanied by: self  PERTINENT HISTORY: Per Dr. Freddi Starr note on 03/12/22: Mr. Banjamin Nolan is a 56 year old male with history of lacunar infarct with left upper extremity weakness and baseline difficulty walking, hypertension, currently on Xarelto, neuropathy, hyperlipidemia chronic back pain neck pain with numbness and tingling who presented to the ED for evaluation of acute onset  left-sided weakness.   He was admitted for stroke evaluation and MRI brain confirmed a 2 cm acute infarct involving the right frontal centrum semi ovale.  CTA head and neck was negative for large vessel occlusions or other significant proximal stenosis.  Pt had another stroke in Aug of 2021 with residual numbness in the LUE, though this had improved.  PRECAUTIONS: Sensory impairment throughout the LUE, fall precautions  WEIGHT BEARING RESTRICTIONS: No  PAIN:  Are you having pain? Yes: NPRS scale: 5/10 Pain location: L side of neck into the shoulder, L hand, L foot Pain description: burning Aggravating factors: increased activity Relieving factors: massage,  Gabapentin, rest  FALLS: Has patient fallen in last 6 months? Yes. Number of falls 1 (yesterday, lost balance getting up from chair)  LIVING ENVIRONMENT: Lives with: lives with their spouse Lives in: Other 2 level home, master bedroom on main floor Stairs: Yes: Internal: 13 steps; on right going up, 2 rails to go through garage (4 steps) Has following equipment at home: Single point cane, grab bars in shower, shower chair for walk in shower (no longer having to use)  PLOF: Independent  PATIENT GOALS: Improve coordination in LUE; return to work (IT), golf, wood working  OBJECTIVE:   HAND DOMINANCE: Right  ADLs: Overall ADLs: modified indep/extra time d/t coordination deficits  Transfers/ambulation related to ADLs: indep; recently reduced use of cane.  Not using today. Eating: difficulty cutting food, difficulty opening new packages and containers  Grooming: indep with dominant hand UB Dressing: extra time with clothing fasteners LB Dressing: difficulty tying shoes Toileting: indep Bathing: modified indep Tub Shower transfers: modified indep Equipment:  single point cane, grab bar in shower, shower chair   IADLs: Shopping: using motorized cart in store (supv from spouse) Light housekeeping: spouse manages at baseline Meal Prep: pt not participating in any light meal prep right now d/t balance and coordination deficits Community mobility: short distances, newly walking without cane Medication management: spouse manages meds at baseline Financial management: indep  Handwriting: N/A (pt R hand dominant)  MOBILITY STATUS: Hx of falls  POSTURE COMMENTS:  No Significant postural limitations Sitting balance: no impairment  ACTIVITY TOLERANCE: Activity tolerance: Pt estimates 1 hour of activity would wear him out.  FUNCTIONAL OUTCOME MEASURES: FOTO: 57, predicted 71  UPPER EXTREMITY ROM:    Active ROM Right eval Left eval  Shoulder flexion 180 130  Shoulder abduction  180 180  Shoulder adduction    Shoulder extension    Shoulder internal rotation    Shoulder external rotation    (Blank rows = not tested)  UPPER EXTREMITY MMT:     MMT Right eval Left eval  Shoulder flexion 4- 5  Shoulder abduction 4- 5  Shoulder adduction    Shoulder extension    Shoulder internal rotation 4- 5  Shoulder external rotation 3+ 5  Middle trapezius    Lower trapezius    Elbow flexion    Elbow extension    Wrist flexion 4 5  Wrist extension 4 5  Wrist ulnar deviation    Wrist radial deviation    Wrist pronation    Wrist supination    (Blank rows = not tested)  HAND FUNCTION: Grip strength: Right: 104 lbs; Left: 64 lbs, Lateral pinch: Right: 23 lbs, Left: 19 lbs, and 3 point pinch: Right: 20 lbs, Left: 11 lbs  COORDINATION: Finger Nose Finger test: L slow and moderately ataxic  9 Hole Peg test: Right: 26 sec; Left: 44 sec  SENSATION: Light touch: Impaired , numbness/burning in L hand, light touch intact, decreased proprioception in L hand.  EDEMA: none  MUSCLE TONE: LUE: Within functional limits  COGNITION: Overall cognitive status: Within functional limits for tasks assessed  VISION: Subjective report: wears glasses all the time.  Pt reports no changes in vision since CVA.   PERCEPTION: WFL  PRAXIS: Impaired:  Motor planning  OBSERVATIONS:  Pt pleasant, cooperative, good awareness of deficits and being diligent with attempts at engaging LUE with ADLs.  TODAY'S TREATMENT:                                                                                                                              Evaluation completed.   Therapeutic Exercise: Issued green theraputty and instructed pt in gross grasping/pinching, digit abd/add, and digging coins out of putty with L hand.  Encouraged daily use, 5-10 min at a time, 2-3x per day.     PATIENT EDUCATION: Education details: OT poc, theraputty exercises, precautions with sensory impairment in the L  hand Person educated: Patient Education method: Explanation and Demonstration Education comprehension: verbalized understanding, returned demonstration, and needs further education  HOME EXERCISE PROGRAM: Theraputty   GOALS: Goals reviewed with patient? Yes  SHORT TERM GOALS: Target date: 05/04/22  Pt will be indep with HEP for increasing LUE strength and coordination for daily tasks.  Baseline: Initiated theraputty exercises Goal status: INITIAL   LONG TERM GOALS: Target date: 06/15/22  Pt will increase FOTO score to 65 or better to improve self perceived performance with daily tasks.  Baseline: 57  Goal status: INITIAL  2.  Pt will increase L grip strength by 20 or more lbs to improve ability to hold and carry ADL supplies.   Baseline: L grip 64 lbs, R 104 lbs Goal status: INITIAL  3.  Pt will increase L 3 point pinch strength by 5 or more lbs to improve ability to pick up and manipulate small objects.  Baseline: L 11 lbs, R 20 lbs Goal status: INITIAL  4.  Pt will improve L hand FMC (9 hole by 10 sec or more) to improve efficiency with picking up pills and small ADL supplies from table top.   Baseline: L 9 hole 44 s, R 26 Goal status: INITIAL  5.  Pt will increase LUE GMC to enable reaching out car window for ATM with good accuracy/steady arm. Baseline: Difficult/moderate ataxia Goal status: INITIAL  6.  Pt will increase LUE strength by 1/2 grade or more to be able to swing a golf club with baseline R/L handling distribution. Baseline: Pt reports he overcompensates with the RUE. Goal status: INITIAL  ASSESSMENT:  CLINICAL IMPRESSION: Patient is a 56 y.o. male who was seen today for occupational therapy evaluation for acute CVA.  Pt presents with LUE weakness, coordination (moderate ataxia), and sensory impairment throughout the LUE, resulting in decline in ADLs, work, and leisure activities.  Pt will benefit from skilled OT to address above noted deficits to work  towards return to PLOF.     PERFORMANCE DEFICITS: in functional skills including ADLs, IADLs, coordination, dexterity, sensation, ROM, strength, pain, Fine motor control, Gross motor control, mobility, balance, endurance, decreased knowledge of precautions, and UE functional use. IMPAIRMENTS: are limiting patient from ADLs, IADLs, work, and leisure.   CO-MORBIDITIES: may have co-morbidities  that affects occupational performance. Patient will benefit from skilled OT to address above impairments and improve overall function.  MODIFICATION OR ASSISTANCE TO COMPLETE EVALUATION: No modification of tasks or assist necessary to complete an evaluation.  OT OCCUPATIONAL PROFILE AND HISTORY: Problem focused assessment: Including review of records relating to presenting problem.  CLINICAL DECISION MAKING: Moderate - several treatment options, min-mod task modification necessary  REHAB POTENTIAL: Good  EVALUATION COMPLEXITY: Moderate    PLAN:  OT FREQUENCY: 2x/week  OT DURATION: 12 weeks  PLANNED INTERVENTIONS: self care/ADL training, therapeutic exercise, therapeutic activity, neuromuscular re-education, manual therapy, balance training, moist heat, cryotherapy, patient/family education, and DME and/or AE instructions  RECOMMENDED OTHER SERVICES: N/A  CONSULTED AND AGREED WITH PLAN OF CARE: Patient  PLAN FOR NEXT SESSION: see OT poc   Leta Speller, MS, OTR/L  Darleene Cleaver, OT 03/23/2022, 9:42 AM

## 2022-03-24 ENCOUNTER — Encounter: Payer: Self-pay | Admitting: Oncology

## 2022-03-24 ENCOUNTER — Inpatient Hospital Stay: Payer: 59

## 2022-03-24 ENCOUNTER — Inpatient Hospital Stay: Payer: 59 | Attending: Oncology | Admitting: Oncology

## 2022-03-24 VITALS — BP 135/88 | HR 72 | Temp 96.1°F | Wt 245.9 lb

## 2022-03-24 DIAGNOSIS — I480 Paroxysmal atrial fibrillation: Secondary | ICD-10-CM

## 2022-03-24 DIAGNOSIS — Z79899 Other long term (current) drug therapy: Secondary | ICD-10-CM | POA: Insufficient documentation

## 2022-03-24 DIAGNOSIS — Z8673 Personal history of transient ischemic attack (TIA), and cerebral infarction without residual deficits: Secondary | ICD-10-CM

## 2022-03-24 DIAGNOSIS — Z823 Family history of stroke: Secondary | ICD-10-CM | POA: Diagnosis not present

## 2022-03-24 DIAGNOSIS — I639 Cerebral infarction, unspecified: Secondary | ICD-10-CM

## 2022-03-24 DIAGNOSIS — Z7901 Long term (current) use of anticoagulants: Secondary | ICD-10-CM | POA: Diagnosis not present

## 2022-03-24 LAB — VITAMIN B12: Vitamin B-12: 491 pg/mL (ref 180–914)

## 2022-03-24 NOTE — Progress Notes (Signed)
Patient is referred here by Dr. Nehemiah Massed for cerebral infarction. Patient had stroke on 03/10/22.

## 2022-03-24 NOTE — Progress Notes (Signed)
Hematology/Oncology Consult note [phone] [phone]         Patient Care Team: Leonel Ramsay, MD as PCP - General (Infectious Diseases)  REFERRING PROVIDER: Leonel Ramsay, MD   CHIEF COMPLAINTS/REASON FOR VISIT:  Evaluation of recurrent stroke  HISTORY OF PRESENTING ILLNESS:   Carlos Nolan is a  56 y.o.  male with PMH listed below was seen in consultation at the request of  Leonel Ramsay, MD  for evaluation of recurrent stroke  Patient was previously on Xarelto for paroxysmal atrial fibrillation and history of lacunar infarct stroke.  Patient has baseline difficulty walking and left upper extremity weakness. 03/10/2022 - 03/12/2022, patient was hospitalized due to acute ischemia stroke.  His MRI showed 2 cm acute infarct involving the right frontal centrum semi ovale.  Patient was seen by neurology during hospitalization.  His anticoagulation was switched to Eliquis 5 mg twice daily.  His lipid panel is well controlled.  Patient was referred to establish care with hematology for further evaluation.  Patient denies any personal history of DVT.  Denies family history of DVT.  His mother has a history of stroke.  MEDICAL HISTORY:  Past Medical History:  Diagnosis Date   Atrial fibrillation [facility])    COVID-19 01/2019   Sept 2020    SURGICAL HISTORY: Past Surgical History:  Procedure Laterality Date   APPENDECTOMY     CARDIAC ELECTROPHYSIOLOGY STUDY AND ABLATION  04/24/2015   COLONOSCOPY WITH PROPOFOL N/A 08/11/2020   Procedure: COLONOSCOPY WITH PROPOFOL;  Surgeon: Wilford Corner, MD;  Location: [facility];  Service: Endoscopy;  Laterality: N/A;   ELBOW FRACTURE SURGERY     ESOPHAGOGASTRODUODENOSCOPY N/A 01/11/2016   Procedure: ESOPHAGOGASTRODUODENOSCOPY (EGD);  Surgeon: Mauri Pole, MD;  Location: [facility];  Service: Endoscopy;  Laterality: N/A;   FEMUR FRACTURE SURGERY     HARDWARE REMOVAL Left 08/22/2019    Procedure: HARDWARE REMOVAL FROM LEFT ELBOW;  Surgeon: Corky Mull, MD;  Location: [facility] ORS;  Service: Orthopedics;  Laterality: Left;   LOOP RECORDER INSERTION N/A 01/21/2020   Procedure: LOOP RECORDER INSERTION;  Surgeon: Isaias Cowman, MD;  Location: [facility];  Service: Cardiovascular;  Laterality: N/A;   POLYPECTOMY  08/11/2020   Procedure: POLYPECTOMY;  Surgeon: Wilford Corner, MD;  Location: [facility];  Service: Endoscopy;;   SHOULDER ARTHROSCOPY Right 06/15/2016   Procedure: Arthroscopic labral debridement, arthroscopic subacromial decompression, and mini-open bursectomy with exploration of rotator cuff, right shoulder;  Surgeon: Corky Mull, MD;  Location: [facility];  Service: Orthopedics;  Laterality: Right;    SOCIAL HISTORY: Social History   Socioeconomic History   Marital status: Married    Spouse name: Not on file   Number of children: Not on file   Years of education: Not on file   Highest education level: Not on file  Occupational History   Not on file  Tobacco Use   Smoking status: Never   Smokeless tobacco: Never  Vaping Use   Vaping Use: Never used  Substance and Sexual Activity   Alcohol use: Yes    Alcohol/week: 2.0 standard drinks of alcohol    Types: 2 Cans of beer per week    Comment: occasionally   Drug use: No   Sexual activity: Yes    Partners: Female  Other Topics Concern   Not on file  Social History Narrative   Not on file   Social Determinants of Health   Financial Resource Strain: Not on file  Food Insecurity: No Food Insecurity (03/11/2022)   Hunger Vital Sign    Worried About Running Out of Food in the Last Year: Never true    Ran Out of Food in the Last Year: Never true  Transportation Needs: No Transportation Needs (03/11/2022)   PRAPARE - Hydrologist (Medical): No    Lack of Transportation (Non-Medical): No  Physical Activity: Not on file  Stress: Not on file  Social  Connections: Not on file  Intimate Partner Violence: Not At Risk (03/11/2022)   Humiliation, Afraid, Rape, and Kick questionnaire    Fear of Current or Ex-Partner: No    Emotionally Abused: No    Physically Abused: No    Sexually Abused: No    FAMILY HISTORY: Family History  Problem Relation Age of Onset   Hypertension Mother    Stroke Mother    Dementia Mother    Heart failure Father    Diabetes Father     ALLERGIES:  is allergic to banana, penicillins, and quinidine.  MEDICATIONS:  Current Outpatient Medications  Medication Sig Dispense Refill   apixaban (ELIQUIS) 5 MG TABS tablet Take 1 tablet (5 mg total) by mouth 2 (two) times daily. 60 tablet 2   ascorbic acid (VITAMIN C) 500 MG tablet Take by mouth.     atorvastatin (LIPITOR) 40 MG tablet Take 1 tablet (40 mg total) by mouth daily at 6 PM. 30 tablet 2   flecainide (TAMBOCOR) 50 MG tablet Take 1 tablet by mouth 2 (two) times daily.     gabapentin (NEURONTIN) 300 MG capsule Take 300-600 mg by mouth See admin instructions. Take 300 mg by mouth in the morning and 900 mg at night     losartan (COZAAR) 25 MG tablet Take 25 mg by mouth daily.     magnesium oxide (MAG-OX) 400 MG tablet Take 400 mg by mouth daily.     metoprolol succinate (TOPROL-XL) 50 MG 24 hr tablet Take 50 mg by mouth daily.     Multiple Vitamins-Minerals (MULTIVITAMIN WITH MINERALS) tablet Take 1 tablet by mouth daily.     nortriptyline (PAMELOR) 10 MG capsule Take 40 mg by mouth at bedtime.     Omega-3 Fatty Acids (FISH OIL) 1200 MG CAPS Take 1,200 mg by mouth daily.     TURMERIC PO Take 1,000 mg by mouth daily.     fluticasone (FLONASE) 50 MCG/ACT nasal spray Place 1 spray into both nostrils daily as needed for allergies or rhinitis. (Patient not taking: Reported on 03/18/2022)     No current facility-administered medications for this visit.    Review of Systems  Constitutional:  Negative for appetite change, chills, fatigue, fever and unexpected weight  change.  HENT:   Negative for hearing loss and voice change.   Eyes:  Negative for eye problems and icterus.  Respiratory:  Negative for chest tightness, cough and shortness of breath.   Cardiovascular:  Negative for chest pain and leg swelling.  Gastrointestinal:  Negative for abdominal distention and abdominal pain.  Endocrine: Negative for hot flashes.  Genitourinary:  Negative for difficulty urinating, dysuria and frequency.   Musculoskeletal:  Negative for arthralgias.  Skin:  Negative for itching and rash.  Neurological:  Positive for extremity weakness. Negative for light-headedness and numbness.       Chronic extremity weakness and difficulty walking  Hematological:  Negative for adenopathy. Does not bruise/bleed easily.  Psychiatric/Behavioral:  Negative for confusion.    PHYSICAL EXAMINATION: ECOG PERFORMANCE STATUS:  1 - Symptomatic but completely ambulatory Vitals:   03/24/22 0936  BP: 135/88  Pulse: 72  Temp: (!) 96.1 F (35.6 C)  SpO2: 100%   Filed Weights   03/24/22 0936  Weight: 245 lb 14.4 oz (111.5 kg)    Physical Exam Constitutional:      General: He is not in acute distress.    Comments: Patient ambulates independently  HENT:     Head: Normocephalic and atraumatic.  Eyes:     General: No scleral icterus. Cardiovascular:     Rate and Rhythm: Normal rate and regular rhythm.     Heart sounds: Normal heart sounds.  Pulmonary:     Effort: Pulmonary effort is normal. No respiratory distress.     Breath sounds: No wheezing.  Abdominal:     General: Bowel sounds are normal. There is no distension.     Palpations: Abdomen is soft.  Musculoskeletal:        General: No deformity. Normal range of motion.     Cervical back: Normal range of motion and neck supple.  Skin:    General: Skin is warm and dry.     Findings: No erythema.  Neurological:     Mental Status: He is alert and oriented to person, place, and time. Mental status is at baseline.     Cranial  Nerves: No cranial nerve deficit.  Psychiatric:        Mood and Affect: Mood normal.     LABORATORY DATA:  I have reviewed the data as listed    Latest Ref Rng & Units 03/10/2022    5:20 PM 12/26/2019   10:55 AM 12/25/2019    7:25 AM  CBC  WBC 4.0 - 10.5 K/uL 6.3  12.7  6.7   Hemoglobin 13.0 - 17.0 g/dL 14.1  15.4  15.4   Hematocrit 39.0 - 52.0 % 40.1  43.3  42.9   Platelets 150 - 400 K/uL 163  183  172       Latest Ref Rng & Units 03/10/2022    5:20 PM 12/26/2019   10:55 AM 12/25/2019    7:25 AM  CMP  Glucose 70 - 99 mg/dL 108  108  120   BUN 6 - 20 mg/dL '15  19  19   '$ Creatinine 0.61 - 1.24 mg/dL 1.10  0.95  1.06   Sodium 135 - 145 mmol/L 138  140  133   Potassium 3.5 - 5.1 mmol/L 3.9  3.8  3.5   Chloride 98 - 111 mmol/L 104  105  99   CO2 22 - 32 mmol/L '27  22  24   '$ Calcium 8.9 - 10.3 mg/dL 8.7  9.4  8.9   Total Protein 6.5 - 8.1 g/dL 7.2   7.6   Total Bilirubin 0.3 - 1.2 mg/dL 0.7   1.1   Alkaline Phos 38 - 126 U/L 136   101   AST 15 - 41 U/L 33   20   ALT 0 - 44 U/L 37   26       RADIOGRAPHIC STUDIES: I have personally reviewed the radiological images as listed and agreed with the findings in the report. ECHOCARDIOGRAM COMPLETE  Result Date: 03/13/2022    ECHOCARDIOGRAM REPORT   Patient Name:   ERMAL HABERER Date of Exam: 03/11/2022 Medical Rec #:  [id number]        Height:       73.0 in Accession #:    [id number]  Weight:       235.0 lb Date of Birth:  1966/01/21        BSA:          2.304 m Patient Age:    22 years         BP:           146/90 mmHg Patient Gender: M                HR:           79 bpm. Exam Location:  [facility] Procedure: 2D Echo, Cardiac Doppler and Color Doppler Indications:    Stroke  History:        Patient has no prior history of Echocardiogram examinations.                 Arrythmias:Atrial Fibrillation; Risk Factors:Hypertension and                 Dyslipidemia.  Sonographer:    Eartha Inch Referring Phys: [id number] AMY N COX  Sonographer  Comments: Suboptimal apical window and suboptimal subcostal window. Image acquisition challenging due to patient body habitus and Image acquisition challenging due to respiratory motion. IMPRESSIONS  1. Left ventricular ejection fraction, by estimation, is 60 to 65%. The left ventricle has normal function. The left ventricle has no regional wall motion abnormalities. The left ventricular internal cavity size was moderately dilated. Left ventricular diastolic parameters were normal.  2. Right ventricular systolic function is normal. The right ventricular size is normal.  3. The mitral valve is normal in structure. Trivial mitral valve regurgitation.  4. The aortic valve is normal in structure. Aortic valve regurgitation is not visualized. FINDINGS  Left Ventricle: Left ventricular ejection fraction, by estimation, is 60 to 65%. The left ventricle has normal function. The left ventricle has no regional wall motion abnormalities. The left ventricular internal cavity size was moderately dilated. There is borderline concentric left ventricular hypertrophy. Left ventricular diastolic parameters were normal. Right Ventricle: The right ventricular size is normal. No increase in right ventricular wall thickness. Right ventricular systolic function is normal. Left Atrium: Left atrial size was normal in size. Right Atrium: Right atrial size was normal in size. Pericardium: There is no evidence of pericardial effusion. Mitral Valve: The mitral valve is normal in structure. Trivial mitral valve regurgitation. MV peak gradient, 3.0 mmHg. The mean mitral valve gradient is 2.0 mmHg. Tricuspid Valve: The tricuspid valve is normal in structure. Tricuspid valve regurgitation is not demonstrated. Aortic Valve: The aortic valve is normal in structure. Aortic valve regurgitation is not visualized. Pulmonic Valve: The pulmonic valve was normal in structure. Pulmonic valve regurgitation is not visualized. Aorta: The ascending aorta was not  well visualized. IAS/Shunts: No atrial level shunt detected by color flow Doppler.  LEFT VENTRICLE PLAX 2D LVIDd:         5.30 cm   Diastology LVIDs:         3.40 cm   LV e' medial:    6.96 cm/s LV PW:         1.00 cm   LV E/e' medial:  10.6 LV IVS:        0.80 cm   LV e' lateral:   6.96 cm/s LVOT diam:     2.40 cm   LV E/e' lateral: 10.6 LV SV:         102 LV SV Index:   44 LVOT Area:     4.52 cm  RIGHT VENTRICLE RV S  prime:     14.70 cm/s TAPSE (M-mode): 1.8 cm LEFT ATRIUM             Index        RIGHT ATRIUM           Index LA diam:        3.80 cm 1.65 cm/m   RA Area:     16.80 cm LA Vol (A2C):   32.0 ml 13.89 ml/m  RA Volume:   44.70 ml  19.40 ml/m LA Vol (A4C):   53.3 ml 23.13 ml/m LA Biplane Vol: 42.2 ml 18.31 ml/m  AORTIC VALVE LVOT Vmax:   112.00 cm/s LVOT Vmean:  84.900 cm/s LVOT VTI:    0.226 m  AORTA Ao Root diam: 3.20 cm Ao Asc diam:  3.30 cm MITRAL VALVE MV Area (PHT): 2.71 cm    SHUNTS MV Area VTI:   4.14 cm    Systemic VTI:  0.23 m MV Peak grad:  3.0 mmHg    Systemic Diam: 2.40 cm MV Mean grad:  2.0 mmHg MV Vmax:       0.87 m/s MV Vmean:      63.8 cm/s MV Decel Time: 280 msec MV E velocity: 73.90 cm/s MV A velocity: 64.40 cm/s MV E/A ratio:  1.15 Dwayne D Callwood MD Electronically signed by Yolonda Kida MD Signature Date/Time: 03/13/2022/12:37:18 PM    Final    MR BRAIN WO CONTRAST  Result Date: 03/10/2022 CLINICAL DATA:  Initial evaluation for neuro deficit, stroke suspected. EXAM: MRI HEAD WITHOUT CONTRAST TECHNIQUE: Multiplanar, multiecho pulse sequences of the brain and surrounding structures were obtained without intravenous contrast. COMPARISON:  Prior CTs from earlier the same day as well as previous MRI from 12/25/2019. FINDINGS: Brain: Patchy and confluent T2/FLAIR hyperintensity involving the periventricular deep white matter both cerebral hemispheres, consistent with chronic microvascular ischemic disease, moderately advanced. Few scatter remote lacunar infarcts present  about the hemispheric cerebral white matter and thalami. Approximate 2 cm acute ischemic nonhemorrhagic infarcts seen involving the right frontal centrum semi ovale (series 7, image 21). No associated hemorrhage or mass effect. Gray-white matter differentiation otherwise maintained. No acute intracranial hemorrhage. Few chronic micro hemorrhages noted, most notably in the right thalamus, likely hypertensive in nature. No mass lesion, midline shift or mass effect. No hydrocephalus or extra-axial fluid collection. Pituitary gland and suprasellar region normal. Vascular: Major intracranial vascular flow voids are maintained. Skull and upper cervical spine: Craniocervical junction normal. Bone marrow signal intensity within normal limits. No scalp soft tissue abnormality. Sinuses/Orbits: Globes and orbital soft tissues within normal limits. Mild scattered mucosal thickening noted about the ethmoidal air cells. Other: No mastoid effusion. IMPRESSION: 1. 2 cm acute ischemic nonhemorrhagic infarct involving the right frontal centrum semi ovale. 2. Underlying moderately advanced chronic microvascular ischemic disease with a few scattered remote lacunar infarcts about the hemispheric cerebral white matter and thalami. Electronically Signed   By: Jeannine Boga M.D.   On: 03/10/2022 20:35   CT ANGIO HEAD NECK W WO CM (CODE STROKE)  Result Date: 03/10/2022 CLINICAL DATA:  Stroke, follow up EXAM: CT ANGIOGRAPHY HEAD AND NECK TECHNIQUE: Multidetector CT imaging of the head and neck was performed using the standard protocol during bolus administration of intravenous contrast. Multiplanar CT image reconstructions and MIPs were obtained to evaluate the vascular anatomy. Carotid stenosis measurements (when applicable) are obtained utilizing NASCET criteria, using the distal internal carotid diameter as the denominator. RADIATION DOSE REDUCTION: This exam was performed according to the  departmental dose-optimization program  which includes automated exposure control, adjustment of the mA and/or kV according to patient size and/or use of iterative reconstruction technique. CONTRAST:  154m OMNIPAQUE IOHEXOL 350 MG/ML SOLN COMPARISON:  CT head from the same day. FINDINGS: CTA NECK FINDINGS Aortic arch: Great vessel origins are patent. Right carotid system: No evidence of dissection, stenosis (50% or greater), or occlusion. Left carotid system: No evidence of dissection, stenosis (50% or greater), or occlusion. Vertebral arteries: Codominant. No evidence of dissection, stenosis (50% or greater), or occlusion. Skeleton: No acute findings. Other neck: No acute findings. Upper chest: Visualized lung apices are clear. Review of the MIP images confirms the above findings CTA HEAD FINDINGS Anterior circulation: Bilateral intracranial ICAs, MCAs, and ACAs are patent without proximal hemodynamically significant stenosis. Posterior circulation: Bilateral intradural vertebral arteries and basilar artery and bilateral posterior cerebral arteries are patent without proximal hemodynamically significant stenosis. Venous sinuses: As permitted by contrast timing, patent. Review of the MIP images confirms the above findings IMPRESSION: No emergent large vessel occlusion or proximal hemodynamically significant stenosis. Electronically Signed   By: FMargaretha SheffieldM.D.   On: 03/10/2022 17:13   CT HEAD CODE STROKE WO CONTRAST  Result Date: 03/10/2022 CLINICAL DATA:  Code stroke. Neuro deficit, acute, stroke suspected. Left-sided numbness and weakness. EXAM: CT HEAD WITHOUT CONTRAST TECHNIQUE: Contiguous axial images were obtained from the base of the skull through the vertex without intravenous contrast. RADIATION DOSE REDUCTION: This exam was performed according to the departmental dose-optimization program which includes automated exposure control, adjustment of the mA and/or kV according to patient size and/or use of iterative reconstruction  technique. COMPARISON:  Head CT 12/24/2019 and MRI 12/25/2019 FINDINGS: Brain: There is no evidence of an acute infarct, intracranial hemorrhage, mass, midline shift, or extra-axial fluid collection. Patchy hypodensities in the cerebral white matter bilaterally are similar to the prior CT. A chronic lacunar infarct in the right thalamus was acute in 2021. The ventricles are normal in size. Vascular: Calcified atherosclerosis at the skull base. No hyperdense vessel. Skull: No fracture or suspicious osseous lesion. Sinuses/Orbits: Mild left ethmoid air cell mucosal thickening. Clear mastoid air cells. Unremarkable orbits. Other: None. ASPECTS (ANicholsonStroke Program Early CT Score) - Ganglionic level infarction (caudate, lentiform nuclei, internal capsule, insula, M1-M3 cortex): 7 - Supraganglionic infarction (M4-M6 cortex): 3 Total score (0-10 with 10 being normal): 10 These results were communicated to Dr. KLeonel Ramsayat 4:50 pm on 03/10/2022 by text page via the [facility] system. IMPRESSION: 1. No evidence of acute intracranial abnormality. ASPECTS of 10. 2. 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 3. Chronic lacunar infarct in the right thalamus. Electronically Signed   By: ALogan BoresM.D.   On: 03/10/2022 16:50       ASSESSMENT & PLAN:   Recurrent strokes (HCC) Recurrent stroke while on anticoagulation. It is possible that Xarelto does not provide great pharmacodynamic coverages for him to prevent stroke. Eliquis 5 mg twice daily may be superior.  I agree with switching to Eliquis. Recommend hypercoagulable work-up. Check factor V Leiden mutation, prothrombin gene mutation, ANA, JAK2 V6 27F mutation with reflex to other mutations, vitamin B12, I will hold off checking protein C&S, APS in the acute setting, will check in 3 months.    Orders Placed This Encounter  Procedures   ANA, IFA (with reflex)    Standing Status:   Future    Number of Occurrences:   1     Standing Expiration Date:  03/25/2023   Factor 5 leiden    Standing Status:   Future    Number of Occurrences:   1    Standing Expiration Date:   03/25/2023   Prothrombin gene mutation    Standing Status:   Future    Number of Occurrences:   1    Standing Expiration Date:   03/25/2023   JAK2 V617F rfx CALR/MPL/E12-15    Standing Status:   Future    Number of Occurrences:   1    Standing Expiration Date:   03/25/2023   Vitamin B12    Standing Status:   Future    Number of Occurrences:   1    Standing Expiration Date:   03/25/2023   Follow up 3 months.  All questions were answered. The patient knows to call the clinic with any problems, questions or concerns.  Leonel Ramsay, MD   Thank you for this kind referral and the opportunity to participate in the care of this patient. A copy of today's note is routed to referring provider   Earlie Server, MD, PhD [facility] Hematology Oncology 03/24/2022

## 2022-03-24 NOTE — Assessment & Plan Note (Addendum)
Recurrent stroke while on anticoagulation. It is possible that Xarelto does not provide great pharmacodynamic coverages for him to prevent stroke. Eliquis 5 mg twice daily may be superior.  I agree with switching to Eliquis. Recommend hypercoagulable work-up. Check factor V Leiden mutation, prothrombin gene mutation, ANA, JAK2 V6 17F mutation with reflex to other mutations, vitamin B12, I will hold off checking protein C&S, APS in the acute setting, will check in 3 months.  

## 2022-03-25 LAB — ANTINUCLEAR ANTIBODIES, IFA: ANA Ab, IFA: NEGATIVE

## 2022-03-29 ENCOUNTER — Ambulatory Visit: Payer: 59 | Admitting: Occupational Therapy

## 2022-03-29 ENCOUNTER — Ambulatory Visit: Payer: 59

## 2022-03-29 DIAGNOSIS — R262 Difficulty in walking, not elsewhere classified: Secondary | ICD-10-CM | POA: Diagnosis not present

## 2022-03-29 DIAGNOSIS — R2689 Other abnormalities of gait and mobility: Secondary | ICD-10-CM

## 2022-03-29 DIAGNOSIS — R278 Other lack of coordination: Secondary | ICD-10-CM

## 2022-03-29 DIAGNOSIS — M6281 Muscle weakness (generalized): Secondary | ICD-10-CM

## 2022-03-29 NOTE — Therapy (Signed)
OUTPATIENT PHYSICAL THERAPY NEURO TREATMENT   Patient Name: Carlos Nolan MRN: [id number] DOB:11/21/1965, 56 y.o., male Today's Date: 03/29/2022   PCP: Leonel Ramsay, MD REFERRING PROVIDER: Leonel Ramsay, MD   PT End of Session - 03/29/22 1515     Visit Number 4    Number of Visits 24    Date for PT Re-Evaluation 04/01/22    Progress Note Due on Visit 10    PT Start Time 4970    PT Stop Time 1600    PT Time Calculation (min) 44 min    Equipment Utilized During Treatment Gait belt    Activity Tolerance Patient tolerated treatment well    Behavior During Therapy [facility] for tasks assessed/performed              Past Medical History:  Diagnosis Date   Atrial fibrillation [facility])    COVID-19 01/2019   Sept 2020   Past Surgical History:  Procedure Laterality Date   APPENDECTOMY     CARDIAC ELECTROPHYSIOLOGY STUDY AND ABLATION  04/24/2015   COLONOSCOPY WITH PROPOFOL N/A 08/11/2020   Procedure: COLONOSCOPY WITH PROPOFOL;  Surgeon: Wilford Corner, MD;  Location: [facility];  Service: Endoscopy;  Laterality: N/A;   ELBOW FRACTURE SURGERY     ESOPHAGOGASTRODUODENOSCOPY N/A 01/11/2016   Procedure: ESOPHAGOGASTRODUODENOSCOPY (EGD);  Surgeon: Mauri Pole, MD;  Location: [facility];  Service: Endoscopy;  Laterality: N/A;   FEMUR FRACTURE SURGERY     HARDWARE REMOVAL Left 08/22/2019   Procedure: HARDWARE REMOVAL FROM LEFT ELBOW;  Surgeon: Corky Mull, MD;  Location: [facility];  Service: Orthopedics;  Laterality: Left;   LOOP RECORDER INSERTION N/A 01/21/2020   Procedure: LOOP RECORDER INSERTION;  Surgeon: Isaias Cowman, MD;  Location: [place] [facility];  Service: Cardiovascular;  Laterality: N/A;   POLYPECTOMY  08/11/2020   Procedure: POLYPECTOMY;  Surgeon: Wilford Corner, MD;  [facility];  Service: Endoscopy;;   SHOULDER ARTHROSCOPY Right 06/15/2016   Procedure: Arthroscopic labral debridement, arthroscopic subacromial decompression, and  mini-open bursectomy with exploration of rotator cuff, right shoulder;  Surgeon: Corky Mull, MD;  Location: [place];  Service: Orthopedics;  Laterality: Right;   Patient Active Problem List   Diagnosis Date Noted   Recurrent strokes ([place]) 03/24/2022   Acute ischemic stroke ([place]) 03/10/2022   Essential hypertension 03/10/2022   Hyperlipidemia 03/10/2022   Atrial fibrillation, chronic ([place]) 03/10/2022   History of adenomatous polyp of colon 08/11/2020   Stroke (cerebrum) ([place]) 12/26/2019   Acute thalamic infarction ([place]) 12/25/2019   S/P ablation of atrial fibrillation 12/25/2019   Food impaction of esophagus     ONSET DATE: 03/10/22  REFERRING DIAG:   Y63.78 (ICD-10-CM) - Personal history of transient ischemic attack (TIA), and cerebral infarction without residual deficits  I69.90 (ICD-10-CM) - Unspecified sequelae of unspecified cerebrovascular disease    THERAPY DIAG:  Muscle weakness (generalized)  Other lack of coordination  Difficulty in walking, not elsewhere classified  Imbalance  Other abnormalities of gait and mobility  Rationale for Evaluation and Treatment: Rehabilitation  SUBJECTIVE:  SUBJECTIVE STATEMENT: Pt reports he received a steroid injection last week in the knee.  Pt reports he is not experiencing much relief with the injection, and MD recommended seeing how it does before potentially injecting a gel.  Pt states that the MD looked at the X-Ray and says the arthritis has deteriorated the meniscus.     PERTINENT HISTORY: Pt is a 56 year old male with history of lacunar infarct with left upper extremity weakness and baseline difficulty walking, hypertension, currently on Xarelto, neuropathy, hyperlipidemia chronic back pain neck pain with numbness and tingling  who presents emergency department for chief concerns of left-sided weakness. Per MRI impression: "2 cm acute ischemic nonhemorrhagic infarct involving the right  frontal centrum semi ovale".    PAIN:  Are you having pain? Yes, 5/10 in L foot, hand.  Depending on the pressure applied, also in the L knee.   PRECAUTIONS: None  WEIGHT BEARING RESTRICTIONS: No  FALLS: Has patient fallen in last 6 months? No  LIVING ENVIRONMENT: Lives with: lives with their spouse Lives in: House/apartment Stairs: Yes: Internal: 13 steps; on right going up and External: 4 steps; can reach both Has following equipment at home: Single point cane  PLOF: Requires assistive device for independence   PATIENT GOALS:   Pt states he wants to be able walk without a limp and a without a cane.  Pt wants to be able to walk up/down stairs with increased confidence and lack of fear of falling.  Pt wants to be able to get back to being able to ride the bike and play golf again.   OBJECTIVE:   DIAGNOSTIC FINDINGS:   EXAM: MRI HEAD WITHOUT CONTRAST  IMPRESSION: 1. 2 cm acute ischemic nonhemorrhagic infarct involving the right frontal centrum semi ovale. 2. Underlying moderately advanced chronic microvascular ischemic disease with a few scattered remote lacunar infarcts about the hemispheric cerebral white matter and thalami.  COGNITION: Overall cognitive status: Within functional limits for tasks assessed   SENSATION: Decreased sensation on the dorsum of the foot and along the medial portion of the L calf  COORDINATION: Pt has coordination deficits when performing the heel-shin method on the L side Pt has coordination deficits when performing the rapid supination/pronation on the L side Decreased thumb to finger rapid touch on the L side  POSTURE: No Significant postural limitations  LOWER EXTREMITY ROM:     Active  Right Eval Left Eval  Hip flexion    Hip extension    Hip abduction    Hip  adduction    Hip internal rotation    Hip external rotation    Knee flexion    Knee extension    Ankle dorsiflexion    Ankle plantarflexion    Ankle inversion    Ankle eversion     (Blank rows = not tested)  LOWER EXTREMITY MMT:    MMT Right Eval Left Eval  Hip flexion 5 4-  Hip extension    Hip abduction    Hip adduction    Hip internal rotation    Hip external rotation    Knee flexion 5 4  Knee extension 5 4-  Ankle dorsiflexion 5 3+  Ankle plantarflexion    Ankle inversion    Ankle eversion    (Blank rows = not tested)  TRANSFERS: Assistive device utilized: Single point cane  Sit to stand: Modified independence Stand to sit: Modified independence Chair to chair: Modified independence  GAIT: Gait pattern: decreased arm swing- Left, decreased step length-  Right, decreased stance time- Left, decreased stride length, decreased hip/knee flexion- Left, decreased ankle dorsiflexion- Left, and poor foot clearance- Left Distance walked: 69f Assistive device utilized: None Level of assistance: SBA Comments: Pt stable, just weak on the L side.  FUNCTIONAL TESTS:  5 times sit to stand: 35.72 Timed up and go (TUG): 18.82 sec without AD 6 minute walk test: 535 ft 10 meter walk test: 16.18 Functional gait assessment: 17/30  PATIENT SURVEYS:  FOTO 41/59  TODAY'S TREATMENT:                                 TherEx:  Seated marches with 5# AW donned, 2x15 each LE Seated LAQ with 5# AW donned, 2x15 each LE Standing calf raises with 5# AW donned, 2x15 Standing hip abduction with 5# AW donned, 3x15 each direction Stair attempt ascending/descending with increased pain in the L knee, so discontinued Crabwalks with BTB placed around distal thigh, 22' x2 each direction     PATIENT EDUCATION: Education details: Pt educated on role of PT and services provided during current POC, along with prognosis and information about the clinic. Person educated: Patient Education  method: Explanation Education comprehension: verbalized understanding    HOME EXERCISE PROGRAM:  Access Code: [email or web address]: [email or web address] Date: 03/23/2022 Prepared by: JRaleigh Nation Exercises - Supine Bridge  - 1 x daily - 7 x weekly - 3 sets - 10 reps - Clamshell  - 1 x daily - 7 x weekly - 3 sets - 10 reps - Sidelying Reverse Clamshell  - 1 x daily - 7 x weekly - 3 sets - 10 reps - Sidelying Bent Knee Lift at 45 Degrees  - 1 x daily - 7 x weekly - 3 sets - 10 reps - Supine Hip Adduction Isometric with Ball  - 1 x daily - 7 x weekly - 3 sets - 10 reps - Supine Figure 4 Piriformis Stretch  - 1 x daily - 7 x weekly - 3 sets - 10 reps - Supine Piriformis Stretch with Leg Straight  - 1 x daily - 7 x weekly - 3 sets - 10 reps   GOALS: Goals reviewed with patient? Yes  SHORT TERM GOALS: Target date: 04/26/2022  Pt will be independent with HEP in order to demonstrate increased ability to perform tasks related to occupation/hobbies. Baseline: Goal status: INITIAL  LONG TERM GOALS: Target date: 06/10/2022  1.  Patient (> 685years old) will complete five times sit to stand test in < 15 seconds indicating an increased LE strength and improved balance. Baseline: 35.72 Goal status: INITIAL  2.  Patient will increase FOTO score to equal to or greater than 59 to demonstrate statistically significant improvement in mobility and quality of life.  Baseline: 41 Goal status: INITIAL   3.  Patient will increase FGA score by > 4 points to demonstrate decreased fall risk during functional activities. Baseline: 17/30 Goal status: INITIAL   4.  Patient will reduce timed up and go to <11 seconds to reduce fall risk and demonstrate improved transfer/gait ability. Baseline: 18.82 sec Goal status: INITIAL  5.  Patient will increase 10 meter walk test to >1.060m as to improve gait speed for better community ambulation and to reduce fall risk. Baseline: 16.18 sec Goal status:  INITIAL  6.  Patient will increase six minute walk test distance to >1000 for progression to community ambulator and improve gait ability Baseline: 535  ft Goal status: INITIAL    ASSESSMENT:  CLINICAL IMPRESSION:  Pt continues to perform well with the strengthening of the knee while continuing to perform in pain free movements.  Pt is making good progress, but maybe most limited due to the lingering knee pain at this time.  Pt will continue to be introduced to more balance training activities going forward with therapy progression.       OBJECTIVE IMPAIRMENTS: Abnormal gait, decreased activity tolerance, decreased balance, decreased coordination, decreased mobility, difficulty walking, decreased strength, and impaired UE functional use.   ACTIVITY LIMITATIONS: carrying, lifting, bending, standing, squatting, transfers, bed mobility, bathing, dressing, and locomotion level  PARTICIPATION LIMITATIONS: cleaning, laundry, shopping, community activity, occupation, and yard work  PERSONAL FACTORS: Age, Past/current experiences, Time since onset of injury/illness/exacerbation, and 1-2 comorbidities: stroke, Afib  are also affecting patient's functional outcome.   REHAB POTENTIAL: Good  CLINICAL DECISION MAKING: Stable/uncomplicated  EVALUATION COMPLEXITY: Moderate  PLAN:  PT FREQUENCY: 2x/week  PT DURATION: 12 weeks  PLANNED INTERVENTIONS: Therapeutic exercises, Therapeutic activity, Neuromuscular re-education, Balance training, Gait training, Patient/Family education, Self Care, Joint mobilization, Vestibular training, Canalith repositioning, Dry Needling, Spinal mobilization, Cryotherapy, Moist heat, and Manual therapy  PLAN FOR NEXT SESSION: Introduce more balance related activities.  Assess compliance with HEP.    Gwenlyn Saran, PT, DPT Physical Therapist- [facility]  03/29/22, 5:24 PM

## 2022-03-30 ENCOUNTER — Encounter: Payer: Self-pay | Admitting: Occupational Therapy

## 2022-03-30 LAB — FACTOR 5 LEIDEN

## 2022-03-30 LAB — PROTHROMBIN GENE MUTATION

## 2022-03-30 NOTE — Therapy (Signed)
OUTPATIENT OCCUPATIONAL THERAPY TREATMENT  Patient Name: Carlos Nolan MRN: [id number] DOB:1966-03-31, 56 y.o., male Today's Date: 03/30/2022  PCP: Dr. Leonel Ramsay (PCP) REFERRING PROVIDER: Dr. Gurney Maxin (Neurologist)   OT End of Session - 03/30/22 1105     Visit Number 2    Number of Visits 24    Date for OT Re-Evaluation 06/15/22    OT Start Time 0928    OT Stop Time 1015    OT Time Calculation (min) 47 min    Activity Tolerance Patient tolerated treatment well    Behavior During Therapy [facility] for tasks assessed/performed             Past Medical History:  Diagnosis Date   Atrial fibrillation [facility])    COVID-19 01/2019   Sept 2020   Past Surgical History:  Procedure Laterality Date   APPENDECTOMY     CARDIAC ELECTROPHYSIOLOGY STUDY AND ABLATION  04/24/2015   COLONOSCOPY WITH PROPOFOL N/A 08/11/2020   Procedure: COLONOSCOPY WITH PROPOFOL;  Surgeon: Wilford Corner, MD;  [facility];  Service: Endoscopy;  Laterality: N/A;   ELBOW FRACTURE SURGERY     ESOPHAGOGASTRODUODENOSCOPY N/A 01/11/2016   Procedure: ESOPHAGOGASTRODUODENOSCOPY (EGD);  Surgeon: Mauri Pole, MD;  Location: [facility];  Service: Endoscopy;  Laterality: N/A;   FEMUR FRACTURE SURGERY     HARDWARE REMOVAL Left 08/22/2019   Procedure: HARDWARE REMOVAL FROM LEFT ELBOW;  Surgeon: Corky Mull, MD;  Location: [facility];  Service: Orthopedics;  Laterality: Left;   LOOP RECORDER INSERTION N/A 01/21/2020   Procedure: LOOP RECORDER INSERTION;  Surgeon: Isaias Cowman, MD;  Location: [place] [facility];  Service: Cardiovascular;  Laterality: N/A;   POLYPECTOMY  08/11/2020   Procedure: POLYPECTOMY;  Surgeon: Wilford Corner, MD;  [facility];  Service: Endoscopy;;   SHOULDER ARTHROSCOPY Right 06/15/2016   Procedure: Arthroscopic labral debridement, arthroscopic subacromial decompression, and mini-open bursectomy with exploration of rotator cuff, right shoulder;   Surgeon: Corky Mull, MD;  Location: [place];  Service: Orthopedics;  Laterality: Right;   Patient Active Problem List   Diagnosis Date Noted   Recurrent strokes ([place]) 03/24/2022   Acute ischemic stroke ([place]) 03/10/2022   Essential hypertension 03/10/2022   Hyperlipidemia 03/10/2022   Atrial fibrillation, chronic ([place]) 03/10/2022   History of adenomatous polyp of colon 08/11/2020   Stroke (cerebrum) ([place]) 12/26/2019   Acute thalamic infarction ([place]) 12/25/2019   S/P ablation of atrial fibrillation 12/25/2019   Food impaction of esophagus     ONSET DATE: 03/10/22  REFERRING DIAG: CVA with L sided weakness  THERAPY DIAG:  Muscle weakness (generalized)  Other lack of coordination  Rationale for Evaluation and Treatment: Rehabilitation  SUBJECTIVE:   SUBJECTIVE STATEMENT: Pt reports he vaguely recalls therapist from another episode of care from 2 years prior.  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  Reports decreased control of arm with reaching tasks.    Pt accompanied by: self  PERTINENT HISTORY: Per Dr. Freddi Starr note on 03/12/22: [name] is a 56 year old male with history of lacunar infarct with left upper extremity weakness and baseline difficulty walking, hypertension, currently on Xarelto, neuropathy, hyperlipidemia chronic back pain neck pain with numbness and tingling who presented to the ED for evaluation of acute onset  left-sided weakness. He was admitted for stroke evaluation and MRI brain confirmed a 2 cm acute infarct involving the right frontal  centrum semi ovale.  CTA head and neck was negative for large vessel occlusions or other significant proximal stenosis. Pt had another stroke in Aug of 2021 with residual numbness in the LUE, though this had improved.   PRECAUTIONS: Sensory impairment throughout the LUE, fall precautions  WEIGHT BEARING RESTRICTIONS:  No  PAIN:  Are you having pain? Yes: NPRS scale: 5/10 Pain location: L side of neck into the shoulder, L hand, L foot Pain description: burning Aggravating factors: increased activity Relieving factors: massage, Gabapentin, rest  FALLS: Has patient fallen in last 6 months? Yes. Number of falls 1 (yesterday, lost balance getting up from chair)  LIVING ENVIRONMENT: Lives with: lives with their spouse Lives in: Other 2 level home, master bedroom on main floor Stairs: Yes: Internal: 13 steps; on right going up, 2 rails to go through garage (4 steps) Has following equipment at home: Single point cane, grab bars in shower, shower chair for walk in shower (no longer having to use)  PLOF: Independent  PATIENT GOALS: Improve coordination in LUE; return to work (IT), golf, wood working  OBJECTIVE:   HAND DOMINANCE: Right  ADLs: Overall ADLs: modified indep/extra time d/t coordination deficits  Transfers/ambulation related to ADLs: indep; recently reduced use of cane.  Not using today. Eating: difficulty cutting food, difficulty opening new packages and containers  Grooming: indep with dominant hand UB Dressing: extra time with clothing fasteners LB Dressing: difficulty tying shoes Toileting: indep Bathing: modified indep Tub Shower transfers: modified indep Equipment:  single point cane, grab bar in shower, shower chair   IADLs: Shopping: using motorized cart in store (supv from spouse) Light housekeeping: spouse manages at baseline Meal Prep: pt not participating in any light meal prep right now d/t balance and coordination deficits Community mobility: short distances, newly walking without cane Medication management: spouse manages meds at baseline Financial management: indep  Handwriting: N/A (pt R hand dominant)  MOBILITY STATUS: Hx of falls  POSTURE COMMENTS:  No Significant postural limitations Sitting balance: no impairment  ACTIVITY TOLERANCE: Activity tolerance: Pt  estimates 1 hour of activity would wear him out.  FUNCTIONAL OUTCOME MEASURES: FOTO: 57, predicted 71  UPPER EXTREMITY ROM:    Active ROM Right eval Left eval  Shoulder flexion 180 130  Shoulder abduction 180 180  Shoulder adduction    Shoulder extension    Shoulder internal rotation    Shoulder external rotation    (Blank rows = not tested)  UPPER EXTREMITY MMT:     MMT Right eval Left eval  Shoulder flexion 4- 5  Shoulder abduction 4- 5  Shoulder adduction    Shoulder extension    Shoulder internal rotation 4- 5  Shoulder external rotation 3+ 5  Middle trapezius    Lower trapezius    Elbow flexion    Elbow extension    Wrist flexion 4 5  Wrist extension 4 5  Wrist ulnar deviation    Wrist radial deviation    Wrist pronation    Wrist supination    (Blank rows = not tested)  HAND FUNCTION: Grip strength: Right: 104 lbs; Left: 64 lbs, Lateral pinch: Right: 23 lbs, Left: 19 lbs, and 3 point pinch: Right: 20 lbs, Left: 11 lbs  COORDINATION: Finger Nose Finger test: L slow and moderately ataxic  9 Hole Peg test: Right: 26 sec; Left: 44 sec  SENSATION: Light touch: Impaired , numbness/burning in L hand, light touch intact, decreased proprioception in L hand.  PRAXIS: Impaired: Motor planning  TODAY'S TREATMENT:                                                                                                                                Therapeutic Exercise: Pt seen this date with focus on reaching tasks with use of left UE utilizing [place] tower with graduated 4 levels.  Pt able to complete all 4 levels of reach, increased effort with top level.  Performed one round of placing looped balls onto each level and removing, increased effort and fatigue on last level.  Pt seen for grip strengthening task with use of resistive hand gripper, settings on 3rd and 4th for one round each (17 and 23# of pressure).  Cues for proper positioning on hand gripper, sustained gripping  pattern to pick up large peg with gripper and place into container.  20 pegs in each round.  Pt performing 1# weight for wrist strengthening for wrist extension over arm rest and then on table in neutral position, 10 reps for 1-2 sets each.  Pt tends to keep wrist in flexion even when attempting to use during tasks and requires cues to correct positioning for function.   Neuromuscular Reeducation: Pt seen for task with focus on coordination with unknotting with medium nylon rope.  Slower and more deliberate movements to complete task.  Manipulation of marbles picking up from container, moving from fingertips to palm and using the hand for storage.  Moving items from palm to fingertips to place back into container, able to hold up to 10 at a time in palm and dropped one.  Repeated for 2 rounds. Finger thumb combinations with use of cards, plucking cards towards patient and then away for one set each then alternating those patterns for each card as well as attempts to shuffle deck of cards.    PATIENT EDUCATION: Education details: theraputty exercises, precautions with sensory impairment in the L hand Person educated: Patient Education method: Customer service manager Education comprehension: verbalized understanding, returned demonstration, and needs further education  HOME EXERCISE PROGRAM: Theraputty   GOALS: Goals reviewed with patient? Yes  SHORT TERM GOALS: Target date: 05/04/22  Pt will be indep with HEP for increasing LUE strength and coordination for daily tasks.  Baseline: Initiated theraputty exercises Goal status: INITIAL   LONG TERM GOALS: Target date: 06/15/22  Pt will increase FOTO score to 65 or better to improve self perceived performance with daily tasks.  Baseline: 57  Goal status: INITIAL  2.  Pt will increase L grip strength by 20 or more lbs to improve ability to hold and carry ADL supplies.   Baseline: L grip 64 lbs, R 104 lbs Goal status: INITIAL  3.  Pt  will increase L 3 point pinch strength by 5 or more lbs to improve ability to pick up and manipulate small objects.  Baseline: L 11 lbs, R 20 lbs Goal status: INITIAL  4.  Pt will improve L hand [place] (9  hole by 10 sec or more) to improve efficiency with picking up pills and small ADL supplies from table top.   Baseline: L 9 hole 44 s, R 26 Goal status: INITIAL  5.  Pt will increase LUE [facility] to enable reaching out car window for ATM with good accuracy/steady arm. Baseline: Difficult/moderate ataxia Goal status: INITIAL  6.  Pt will increase LUE strength by 1/2 grade or more to be able to swing a golf club with baseline R/L handling distribution. Baseline: Pt reports he overcompensates with the RUE. Goal status: INITIAL  ASSESSMENT:  CLINICAL IMPRESSION: Pt responding well to tasks during treatment session.  Pt demonstrates decreased gross motor coordination of left UE with reaching tasks and fatigues quickly with multidirectional reach.  Pt tends to keep left wrist in flexion when holding and performing tasks, added wrist extension this date with and without gravity and use of 1#.  He responds well to cues for proper form and technique.  He describes left UE tightness resembling spasticity in the upper arm and shoulder.  Will continue to work towards goals in plan of care to maximize safety and independence in necessary daily ADL and IADL tasks at home, work and in the community.     PERFORMANCE DEFICITS: in functional skills including ADLs, IADLs, coordination, dexterity, sensation, ROM, strength, pain, Fine motor control, Gross motor control, mobility, balance, endurance, decreased knowledge of precautions, and UE functional use. IMPAIRMENTS: are limiting patient from ADLs, IADLs, work, and leisure.   CO-MORBIDITIES: may have co-morbidities  that affects occupational performance. Patient will benefit from skilled OT to address above impairments and improve overall function.  MODIFICATION OR  ASSISTANCE TO COMPLETE EVALUATION: No modification of tasks or assist necessary to complete an evaluation.  OT OCCUPATIONAL PROFILE AND HISTORY: Problem focused assessment: Including review of records relating to presenting problem.  CLINICAL DECISION MAKING: Moderate - several treatment options, min-mod task modification necessary  REHAB POTENTIAL: Good  EVALUATION COMPLEXITY: Moderate    PLAN:  OT FREQUENCY: 2x/week  OT DURATION: 12 weeks  PLANNED INTERVENTIONS: self care/ADL training, therapeutic exercise, therapeutic activity, neuromuscular re-education, manual therapy, balance training, moist heat, cryotherapy, patient/family education, and DME and/or AE instructions   CONSULTED AND AGREED WITH PLAN OF CARE: Patient  PLAN FOR NEXT SESSION: Continue with ROM, strengthening and coordination tasks for daily activities.    Philicia Heyne T Tomasita Morrow, OTR/L, CLT  Sajid Ruppert, OT 03/30/2022, 11:06 AM

## 2022-04-01 LAB — CALR +MPL + E12-E15  (REFLEX)

## 2022-04-01 LAB — JAK2 V617F RFX CALR/MPL/E12-15

## 2022-04-04 ENCOUNTER — Ambulatory Visit: Payer: 59

## 2022-04-04 DIAGNOSIS — M6281 Muscle weakness (generalized): Secondary | ICD-10-CM

## 2022-04-04 DIAGNOSIS — R262 Difficulty in walking, not elsewhere classified: Secondary | ICD-10-CM | POA: Diagnosis not present

## 2022-04-04 DIAGNOSIS — R278 Other lack of coordination: Secondary | ICD-10-CM

## 2022-04-04 DIAGNOSIS — R2689 Other abnormalities of gait and mobility: Secondary | ICD-10-CM

## 2022-04-04 NOTE — Therapy (Signed)
OUTPATIENT PHYSICAL THERAPY NEURO TREATMENT   Patient Name: Carlos Nolan MRN: [id number] DOB:1965/07/23, 56 y.o., male Today's Date: 04/04/2022   PCP: Leonel Ramsay, MD REFERRING PROVIDER: Leonel Ramsay, MD   PT End of Session - 04/04/22 1301     Visit Number 5    Number of Visits 24    Date for PT Re-Evaluation 04/01/22    Progress Note Due on Visit 10    PT Start Time 1301    PT Stop Time 1345    PT Time Calculation (min) 44 min    Equipment Utilized During Treatment Gait belt    Activity Tolerance Patient tolerated treatment well    Behavior During Therapy [facility] for tasks assessed/performed               Past Medical History:  Diagnosis Date   Atrial fibrillation [facility])    COVID-19 01/2019   Sept 2020   Past Surgical History:  Procedure Laterality Date   APPENDECTOMY     CARDIAC ELECTROPHYSIOLOGY STUDY AND ABLATION  04/24/2015   COLONOSCOPY WITH PROPOFOL N/A 08/11/2020   Procedure: COLONOSCOPY WITH PROPOFOL;  Surgeon: Wilford Corner, MD;  Location: [facility];  Service: Endoscopy;  Laterality: N/A;   ELBOW FRACTURE SURGERY     ESOPHAGOGASTRODUODENOSCOPY N/A 01/11/2016   Procedure: ESOPHAGOGASTRODUODENOSCOPY (EGD);  Surgeon: Mauri Pole, MD;  Location: [facility];  Service: Endoscopy;  Laterality: N/A;   FEMUR FRACTURE SURGERY     HARDWARE REMOVAL Left 08/22/2019   Procedure: HARDWARE REMOVAL FROM LEFT ELBOW;  Surgeon: Corky Mull, MD;  Location: [facility];  Service: Orthopedics;  Laterality: Left;   LOOP RECORDER INSERTION N/A 01/21/2020   Procedure: LOOP RECORDER INSERTION;  Surgeon: Isaias Cowman, MD;  Location: [facility];  Service: Cardiovascular;  Laterality: N/A;   POLYPECTOMY  08/11/2020   Procedure: POLYPECTOMY;  Surgeon: Wilford Corner, MD;  Location: [facility];  Service: Endoscopy;;   SHOULDER ARTHROSCOPY Right 06/15/2016   Procedure: Arthroscopic labral debridement, arthroscopic subacromial decompression,  and mini-open bursectomy with exploration of rotator cuff, right shoulder;  Surgeon: Corky Mull, MD;  Location: [place];  Service: Orthopedics;  Laterality: Right;   Patient Active Problem List   Diagnosis Date Noted   Recurrent strokes ([place]) 03/24/2022   Acute ischemic stroke ([place]) 03/10/2022   Essential hypertension 03/10/2022   Hyperlipidemia 03/10/2022   Atrial fibrillation, chronic ([place]) 03/10/2022   History of adenomatous polyp of colon 08/11/2020   Stroke (cerebrum) ([place]) 12/26/2019   Acute thalamic infarction ([place]) 12/25/2019   S/P ablation of atrial fibrillation 12/25/2019   Food impaction of esophagus     ONSET DATE: 03/10/22  REFERRING DIAG:   G01.74 (ICD-10-CM) - Personal history of transient ischemic attack (TIA), and cerebral infarction without residual deficits  I69.90 (ICD-10-CM) - Unspecified sequelae of unspecified cerebrovascular disease    THERAPY DIAG:  Difficulty in walking, not elsewhere classified  Muscle weakness (generalized)  Other lack of coordination  Imbalance  Other abnormalities of gait and mobility  Rationale for Evaluation and Treatment: Rehabilitation  SUBJECTIVE:  SUBJECTIVE STATEMENT: Pt reports over the past several days, he is still getting the feeling where he stumbles because he is not able to pick the L LE up.  Pt was able to assist with getting the Christmas decorations up around the house over the weekend however.   PERTINENT HISTORY: Pt is a 56 year old male with history of lacunar infarct with left upper extremity weakness and baseline difficulty walking, hypertension, currently on Xarelto, neuropathy, hyperlipidemia chronic back pain neck pain with numbness and tingling who presents emergency department for chief concerns of  left-sided weakness. Per MRI impression: "2 cm acute ischemic nonhemorrhagic infarct involving the right  frontal centrum semi ovale".    PAIN:  Are you having pain? Yes, 4-5/10 in L foot, hand.  Depending on the pressure applied, also in the L knee.   PRECAUTIONS: None  WEIGHT BEARING RESTRICTIONS: No  FALLS: Has patient fallen in last 6 months? No  LIVING ENVIRONMENT:  Lives with: lives with their spouse Lives in: House/apartment Stairs: Yes: Internal: 13 steps; on right going up and External: 4 steps; can reach both Has following equipment at home: Single point cane   PLOF: Requires assistive device for independence   PATIENT GOALS:   Pt states he wants to be able walk without a limp and a without a cane.  Pt wants to be able to walk up/down stairs with increased confidence and lack of fear of falling.  Pt wants to be able to get back to being able to ride the bike and play golf again.   OBJECTIVE:   DIAGNOSTIC FINDINGS:   EXAM: MRI HEAD WITHOUT CONTRAST  IMPRESSION: 1. 2 cm acute ischemic nonhemorrhagic infarct involving the right frontal centrum semi ovale. 2. Underlying moderately advanced chronic microvascular ischemic disease with a few scattered remote lacunar infarcts about the hemispheric cerebral white matter and thalami.  COGNITION: Overall cognitive status: Within functional limits for tasks assessed   SENSATION: Decreased sensation on the dorsum of the foot and along the medial portion of the L calf  COORDINATION: Pt has coordination deficits when performing the heel-shin method on the L side Pt has coordination deficits when performing the rapid supination/pronation on the L side Decreased thumb to finger rapid touch on the L side  POSTURE: No Significant postural limitations  LOWER EXTREMITY ROM:     Active  Right Eval Left Eval  Hip flexion    Hip extension    Hip abduction    Hip adduction    Hip internal rotation    Hip external  rotation    Knee flexion    Knee extension    Ankle dorsiflexion    Ankle plantarflexion    Ankle inversion    Ankle eversion     (Blank rows = not tested)  LOWER EXTREMITY MMT:    MMT Right Eval Left Eval  Hip flexion 5 4-  Hip extension    Hip abduction    Hip adduction    Hip internal rotation    Hip external rotation    Knee flexion 5 4  Knee extension 5 4-  Ankle dorsiflexion 5 3+  Ankle plantarflexion    Ankle inversion    Ankle eversion    (Blank rows = not tested)  TRANSFERS: Assistive device utilized: Single point cane  Sit to stand: Modified independence Stand to sit: Modified independence Chair to chair: Modified independence  GAIT: Gait pattern: decreased arm swing- Left, decreased step length- Right, decreased stance time- Left, decreased stride length,  decreased hip/knee flexion- Left, decreased ankle dorsiflexion- Left, and poor foot clearance- Left Distance walked: 61f Assistive device utilized: None Level of assistance: SBA Comments: Pt stable, just weak on the L side.  FUNCTIONAL TESTS:  5 times sit to stand: 35.72 Timed up and go (TUG): 18.82 sec without AD 6 minute walk test: 535 ft 10 meter walk test: 16.18 Functional gait assessment: 17/30  PATIENT SURVEYS:  FOTO 41/59  TODAY'S TREATMENT:                                  TherEx:  Stair training up/down two flights of steps with good technique, and mild increase in pain.      Neuro:  Bout 1: Obstacle course with forward/backward/lateral steps on narrow green step, rock forward on rocker board, narrow tandem ambulation through airex beams, rocking forward on rocker board, step up onto treadmill, down on Airex pad, then ambulation on hedgehogs spaced out for step length, x4 laps  Rest Break performed as pt was fatiguing  Bout 2: Obstacle course with forward/lateral steps on narrow green step, rock forward on rocker board, narrow tandem ambulation through airex beams, rocking  forward on rocker board, step up onto treadmill, down on Airex pad, then ambulation on hedgehogs spaced out for step length, x3 laps  PATIENT EDUCATION: Education details: Pt educated on role of PT and services provided during current POC, along with prognosis and information about the clinic. Person educated: Patient Education method: Explanation Education comprehension: verbalized understanding    HOME EXERCISE PROGRAM:  Access Code: [email or web address]: [email or web address] Date: 03/23/2022 Prepared by: JRaleigh Nation Exercises - Supine Bridge  - 1 x daily - 7 x weekly - 3 sets - 10 reps - Clamshell  - 1 x daily - 7 x weekly - 3 sets - 10 reps - Sidelying Reverse Clamshell  - 1 x daily - 7 x weekly - 3 sets - 10 reps - Sidelying Bent Knee Lift at 45 Degrees  - 1 x daily - 7 x weekly - 3 sets - 10 reps - Supine Hip Adduction Isometric with Ball  - 1 x daily - 7 x weekly - 3 sets - 10 reps - Supine Figure 4 Piriformis Stretch  - 1 x daily - 7 x weekly - 3 sets - 10 reps - Supine Piriformis Stretch with Leg Straight  - 1 x daily - 7 x weekly - 3 sets - 10 reps   GOALS: Goals reviewed with patient? Yes  SHORT TERM GOALS: Target date: 04/26/2022  Pt will be independent with HEP in order to demonstrate increased ability to perform tasks related to occupation/hobbies. Baseline: Goal status: INITIAL  LONG TERM GOALS: Target date: 06/10/2022  1.  Patient (> 659years old) will complete five times sit to stand test in < 15 seconds indicating an increased LE strength and improved balance. Baseline: 35.72 Goal status: INITIAL  2.  Patient will increase FOTO score to equal to or greater than 59 to demonstrate statistically significant improvement in mobility and quality of life.  Baseline: 41 Goal status: INITIAL   3.  Patient will increase FGA score by > 4 points to demonstrate decreased fall risk during functional activities. Baseline: 17/30 Goal status: INITIAL   4.   Patient will reduce timed up and go to <11 seconds to reduce fall risk and demonstrate improved transfer/gait ability. Baseline: 18.82 sec Goal status: INITIAL  5.  Patient will increase 10 meter walk test to >1.48ms as to improve gait speed for better community ambulation and to reduce fall risk. Baseline: 16.18 sec Goal status: INITIAL  6.  Patient will increase six minute walk test distance to >1000 for progression to community ambulator and improve gait ability Baseline: 535 ft Goal status: INITIAL    ASSESSMENT:  CLINICAL IMPRESSION:  Pt performed well with the exercises given today and was able to tolerate increased balance related exercises.  Pt enjoyed the obstacle course and felt as it was beneficial and challenging to perform.  Pt does have most difficulty with stepping on hedgehogs and keeping his balance, but performed well with all other tasks.  Pt to continue to benefit from balance training during future sessions as pt is still having difficulty with his balance at this time.   Pt will continue to benefit from skilled therapy to address remaining deficits in order to improve overall QoL and return to PLOF.       OBJECTIVE IMPAIRMENTS: Abnormal gait, decreased activity tolerance, decreased balance, decreased coordination, decreased mobility, difficulty walking, decreased strength, and impaired UE functional use.   ACTIVITY LIMITATIONS: carrying, lifting, bending, standing, squatting, transfers, bed mobility, bathing, dressing, and locomotion level  PARTICIPATION LIMITATIONS: cleaning, laundry, shopping, community activity, occupation, and yard work  PERSONAL FACTORS: Age, Past/current experiences, Time since onset of injury/illness/exacerbation, and 1-2 comorbidities: stroke, Afib  are also affecting patient's functional outcome.   REHAB POTENTIAL: Good  CLINICAL DECISION MAKING: Stable/uncomplicated  EVALUATION COMPLEXITY: Moderate  PLAN:  PT FREQUENCY:  2x/week  PT DURATION: 12 weeks  PLANNED INTERVENTIONS: Therapeutic exercises, Therapeutic activity, Neuromuscular re-education, Balance training, Gait training, Patient/Family education, Self Care, Joint mobilization, Vestibular training, Canalith repositioning, Dry Needling, Spinal mobilization, Cryotherapy, Moist heat, and Manual therapy  PLAN FOR NEXT SESSION: Introduce more balance related activities.  Assess compliance with HEP.    JGwenlyn Saran PT, DPT Physical Therapist- [facility] 04/04/22, 5:29 PM

## 2022-04-04 NOTE — Therapy (Signed)
OUTPATIENT OCCUPATIONAL THERAPY TREATMENT  Patient Name: Carlos Nolan MRN: [id number] DOB:May 30, 1965, 56 y.o., male Today's Date: 04/04/2022  PCP: Dr. Leonel Ramsay (PCP) REFERRING PROVIDER: Dr. Gurney Maxin (Neurologist)   OT End of Session - 04/04/22 1627     Visit Number 3    Number of Visits 24    Date for OT Re-Evaluation 06/15/22    OT Start Time 1345    OT Stop Time 1430    OT Time Calculation (min) 45 min    Activity Tolerance Patient tolerated treatment well    Behavior During Therapy [facility] for tasks assessed/performed             Past Medical History:  Diagnosis Date   Atrial fibrillation ([place])    COVID-19 01/2019   Sept 2020   Past Surgical History:  Procedure Laterality Date   APPENDECTOMY     CARDIAC ELECTROPHYSIOLOGY STUDY AND ABLATION  04/24/2015   COLONOSCOPY WITH PROPOFOL N/A 08/11/2020   Procedure: COLONOSCOPY WITH PROPOFOL;  Surgeon: Wilford Corner, MD;  Location: [facility];  Service: Endoscopy;  Laterality: N/A;   ELBOW FRACTURE SURGERY     ESOPHAGOGASTRODUODENOSCOPY N/A 01/11/2016   Procedure: ESOPHAGOGASTRODUODENOSCOPY (EGD);  Surgeon: Mauri Pole, MD;  Location: [facility];  Service: Endoscopy;  Laterality: N/A;   FEMUR FRACTURE SURGERY     HARDWARE REMOVAL Left 08/22/2019   Procedure: HARDWARE REMOVAL FROM LEFT ELBOW;  Surgeon: Corky Mull, MD;  Location: [facility];  Service: Orthopedics;  Laterality: Left;   LOOP RECORDER INSERTION N/A 01/21/2020   Procedure: LOOP RECORDER INSERTION;  Surgeon: Isaias Cowman, MD;  Location: [place] [facility];  Service: Cardiovascular;  Laterality: N/A;   POLYPECTOMY  08/11/2020   Procedure: POLYPECTOMY;  Surgeon: Wilford Corner, MD;  Location: [facility];  Service: Endoscopy;;   SHOULDER ARTHROSCOPY Right 06/15/2016   Procedure: Arthroscopic labral debridement, arthroscopic subacromial decompression, and mini-open bursectomy with exploration of rotator cuff, right shoulder;   Surgeon: Corky Mull, MD;  Location: [place];  Service: Orthopedics;  Laterality: Right;   Patient Active Problem List   Diagnosis Date Noted   Recurrent strokes ([place]) 03/24/2022   Acute ischemic stroke ([place]) 03/10/2022   Essential hypertension 03/10/2022   Hyperlipidemia 03/10/2022   Atrial fibrillation, chronic ([place]) 03/10/2022   History of adenomatous polyp of colon 08/11/2020   Stroke (cerebrum) ([place]) 12/26/2019   Acute thalamic infarction ([place]) 12/25/2019   S/P ablation of atrial fibrillation 12/25/2019   Food impaction of esophagus     ONSET DATE: 03/10/22  REFERRING DIAG: CVA with L sided weakness  THERAPY DIAG:  Muscle weakness (generalized)  Other lack of coordination  Rationale for Evaluation and Treatment: Rehabilitation  SUBJECTIVE:  SUBJECTIVE STATEMENT: Pt reports he had a nice holiday with his family.  Pt accompanied by: self  PERTINENT HISTORY: Per Dr. Freddi Starr note on 03/12/22: Mr. Tavious Griesinger is a 56 year old male with history of lacunar infarct with left upper extremity weakness and baseline difficulty walking, hypertension, currently on Xarelto, neuropathy, hyperlipidemia chronic back pain neck pain with numbness and tingling who presented to the ED for evaluation of acute onset  left-sided weakness. He was admitted for stroke evaluation and MRI brain confirmed a 2 cm acute infarct involving the right frontal centrum semi ovale.  CTA head and neck was negative for large vessel occlusions or other significant proximal stenosis. Pt had another stroke in Aug of 2021 with residual numbness in the LUE, though this had improved.   PRECAUTIONS: Sensory  impairment throughout the LUE, fall precautions  WEIGHT BEARING RESTRICTIONS: No  PAIN:  Are you having pain? Yes: NPRS scale: 4-5/10 Pain location: L side of neck into the shoulder, L upper arm, L hand, L foot Pain description: burning Aggravating factors: increased activity Relieving factors:  massage, Gabapentin, rest  FALLS: Has patient fallen in last 6 months? Yes. Number of falls 1 (yesterday, lost balance getting up from chair)  LIVING ENVIRONMENT: Lives with: lives with their spouse Lives in: Other 2 level home, master bedroom on main floor Stairs: Yes: Internal: 13 steps; on right going up, 2 rails to go through garage (4 steps) Has following equipment at home: Single point cane, grab bars in shower, shower chair for walk in shower (no longer having to use)  PLOF: Independent  PATIENT GOALS: Improve coordination in LUE; return to work (IT), golf, wood working  OBJECTIVE:   HAND DOMINANCE: Right  ADLs: Overall ADLs: modified indep/extra time d/t coordination deficits  Transfers/ambulation related to ADLs: indep; recently reduced use of cane.  Not using today. Eating: difficulty cutting food, difficulty opening new packages and containers  Grooming: indep with dominant hand UB Dressing: extra time with clothing fasteners LB Dressing: difficulty tying shoes Toileting: indep Bathing: modified indep Tub Shower transfers: modified indep Equipment:  single point cane, grab bar in shower, shower chair   IADLs: Shopping: using motorized cart in store (supv from spouse) Light housekeeping: spouse manages at baseline Meal Prep: pt not participating in any light meal prep right now d/t balance and coordination deficits Community mobility: short distances, newly walking without cane Medication management: spouse manages meds at baseline Financial management: indep  Handwriting: N/A (pt R hand dominant)  MOBILITY STATUS: Hx of falls  POSTURE COMMENTS:  No Significant postural limitations Sitting balance: no impairment  ACTIVITY TOLERANCE: Activity tolerance: Pt estimates 1 hour of activity would wear him out.  FUNCTIONAL OUTCOME MEASURES: FOTO: 57, predicted 71  UPPER EXTREMITY ROM:    Active ROM Right eval Left eval  Shoulder flexion 180 130  Shoulder  abduction 180 180  Shoulder adduction    Shoulder extension    Shoulder internal rotation    Shoulder external rotation    (Blank rows = not tested)  UPPER EXTREMITY MMT:     MMT Right eval Left eval  Shoulder flexion 4- 5  Shoulder abduction 4- 5  Shoulder adduction    Shoulder extension    Shoulder internal rotation 4- 5  Shoulder external rotation 3+ 5  Middle trapezius    Lower trapezius    Elbow flexion    Elbow extension    Wrist flexion 4 5  Wrist extension 4 5  Wrist ulnar deviation    Wrist radial deviation    Wrist pronation    Wrist supination    (Blank rows = not tested)  HAND FUNCTION: Grip strength: Right: 104 lbs; Left: 64 lbs, Lateral pinch: Right: 23 lbs, Left: 19 lbs, and 3 point pinch: Right: 20 lbs, Left: 11 lbs  COORDINATION: Finger Nose Finger test: L slow and moderately ataxic  9 Hole Peg test: Right: 26 sec; Left: 44 sec  SENSATION: Light touch: Impaired , numbness/burning in L hand, light touch intact, decreased proprioception in L hand.  PRAXIS: Impaired: Motor planning   TODAY'S TREATMENT:  Therapeutic Exercise: Instructed pt in cane stretches for bilat shoulder flexibility, including shoulder flex, abd, ER behind head, and shoulder IR and ext behind back.  Min vc for posture and form.  Facilitated hand strengthening with use of hand gripper set at 23.4# to remove jumbo pegs from pegboard x3 trials using L hand, rest between sets.  OT placed container of pegs to the L of center to facilitate lateral reaching, requiring occasional min vc to minimize a mild L shoulder hike.    Neuromuscular Reeducation: Facilitated L GMC/FMC skills working with Bethena Roys Board on an incline and 1.5" inch length pegs.  Pt practiced rotating pegs within fingertips to alternate pegs between dot side up and down.  Practiced moving pegs from  table top to [facility] to facilitate forward, lateral, and diagonal reaching patterns with the RUE, requiring occasional min vc to minimize a mild L shoulder hike and minimize mild wrist flexion during gripping.  Practiced additional [facility] skills working to pick up Jamar pegs and place into and then remove from pegboard using L hand.   PATIENT EDUCATION: Education details: theraputty exercises, precautions with sensory impairment in the L hand, cane stretches for L shoulder flexibility  Person educated: Patient Education method: Customer service manager Education comprehension: verbalized understanding, returned demonstration, and needs further education  HOME EXERCISE PROGRAM: Theraputty, cane stretches for shoulder flexibility    GOALS: Goals reviewed with patient? Yes  SHORT TERM GOALS: Target date: 05/04/22  Pt will be indep with HEP for increasing LUE strength and coordination for daily tasks.  Baseline: Initiated theraputty exercises Goal status: INITIAL   LONG TERM GOALS: Target date: 06/15/22  Pt will increase FOTO score to 65 or better to improve self perceived performance with daily tasks.  Baseline: 57  Goal status: INITIAL  2.  Pt will increase L grip strength by 20 or more lbs to improve ability to hold and carry ADL supplies.   Baseline: L grip 64 lbs, R 104 lbs Goal status: INITIAL  3.  Pt will increase L 3 point pinch strength by 5 or more lbs to improve ability to pick up and manipulate small objects.  Baseline: L 11 lbs, R 20 lbs Goal status: INITIAL  4.  Pt will improve L hand FMC (9 hole by 10 sec or more) to improve efficiency with picking up pills and small ADL supplies from table top.   Baseline: L 9 hole 44 s, R 26 Goal status: INITIAL  5.  Pt will increase LUE GMC to enable reaching out car window for ATM with good accuracy/steady arm. Baseline: Difficult/moderate ataxia Goal status: INITIAL  6.  Pt will increase LUE strength by 1/2 grade or  more to be able to swing a golf club with baseline R/L handling distribution. Baseline: Pt reports he overcompensates with the RUE. Goal status: INITIAL  ASSESSMENT:  CLINICAL IMPRESSION: Pt tolerated all therapeutic exercises well this date.  Pt reported cane stretches to feel good for tightness in L shoulder and upper arm; required min vc for form and technique.  Pt tolerated all 3 trials of grip strengthening on 4th setting (23.4#) with rest between sets.  Practiced forward and lateral reaching with an outstretched arm to challenge strength and coordination in the LUE.  Pt required intermittent min vc to minimize a mild L shoulder hike, and intermittent rest breaks d/t fatigue in LUE.  Pt demonstrates decreased gross motor coordination of left UE with reaching tasks and fatigues quickly with multidirectional reach.  Pt  tends to keep left wrist in flexion when holding and performing tasks and required only 1 vc to correct this date during use of hand gripper to remove pegs from pegboard.  He responds well to cues for proper form and technique.  He describes left UE tightness resembling spasticity in the upper arm and shoulder, but felt cane stretches to be effective in reducing this tightness.  OT also reviewed WB through the LUE to reduce any mild spasticity in this arm.  Will continue to work towards goals in plan of care to maximize safety and independence in necessary daily ADL and IADL tasks at home, work and in the community.     PERFORMANCE DEFICITS: in functional skills including ADLs, IADLs, coordination, dexterity, sensation, ROM, strength, pain, Fine motor control, Gross motor control, mobility, balance, endurance, decreased knowledge of precautions, and UE functional use. IMPAIRMENTS: are limiting patient from ADLs, IADLs, work, and leisure.   CO-MORBIDITIES: may have co-morbidities  that affects occupational performance. Patient will benefit from skilled OT to address above impairments  and improve overall function.  MODIFICATION OR ASSISTANCE TO COMPLETE EVALUATION: No modification of tasks or assist necessary to complete an evaluation.  OT OCCUPATIONAL PROFILE AND HISTORY: Problem focused assessment: Including review of records relating to presenting problem.  CLINICAL DECISION MAKING: Moderate - several treatment options, min-mod task modification necessary  REHAB POTENTIAL: Good  EVALUATION COMPLEXITY: Moderate    PLAN:  OT FREQUENCY: 2x/week  OT DURATION: 12 weeks  PLANNED INTERVENTIONS: self care/ADL training, therapeutic exercise, therapeutic activity, neuromuscular re-education, manual therapy, balance training, moist heat, cryotherapy, patient/family education, and DME and/or AE instructions   CONSULTED AND AGREED WITH PLAN OF CARE: Patient  PLAN FOR NEXT SESSION: Continue with ROM, strengthening and coordination tasks for daily activities.    Leta Speller, MS, OTR/L   Darleene Cleaver, OT 04/04/2022, 4:29 PM

## 2022-04-05 ENCOUNTER — Encounter: Payer: Self-pay | Admitting: Internal Medicine

## 2022-04-05 ENCOUNTER — Encounter
Admission: RE | Disposition: A | Discharge status: Home / Self Care | Payer: Self-pay | Source: Home / Self Care | Attending: Internal Medicine

## 2022-04-05 ENCOUNTER — Ambulatory Visit
Admission: RE | Admit: 2022-04-05 | Discharge: 2022-04-05 | Disposition: A | Discharge status: Home / Self Care | Payer: 59 | Attending: Internal Medicine | Admitting: Internal Medicine

## 2022-04-05 ENCOUNTER — Other Ambulatory Visit: Payer: Self-pay

## 2022-04-05 ENCOUNTER — Ambulatory Visit
Admission: RE | Admit: 2022-04-05 | Discharge: 2022-04-05 | Disposition: A | Discharge status: Home / Self Care | Payer: 59 | Source: Home / Self Care | Attending: Internal Medicine | Admitting: Internal Medicine

## 2022-04-05 DIAGNOSIS — I6310 Cerebral infarction due to embolism of unspecified precerebral artery: Secondary | ICD-10-CM | POA: Insufficient documentation

## 2022-04-05 DIAGNOSIS — I340 Nonrheumatic mitral (valve) insufficiency: Secondary | ICD-10-CM | POA: Insufficient documentation

## 2022-04-05 HISTORY — DX: Cerebral infarction, unspecified: I63.9

## 2022-04-05 HISTORY — PX: TEE WITHOUT CARDIOVERSION: SHX5443

## 2022-04-05 SURGERY — ECHOCARDIOGRAM, TRANSESOPHAGEAL
Anesthesia: Moderate Sedation

## 2022-04-05 MED ORDER — MIDAZOLAM HCL 2 MG/2ML IJ SOLN
INTRAMUSCULAR | Status: AC
  Filled 2022-04-05: qty 4

## 2022-04-05 MED ORDER — SODIUM CHLORIDE 0.9 % IV SOLN: INTRAVENOUS | Status: DC

## 2022-04-05 MED ORDER — BUTAMBEN-TETRACAINE-BENZOCAINE 2-2-14 % EX AERO
INHALATION_SPRAY | CUTANEOUS | Status: AC
  Filled 2022-04-05: qty 5

## 2022-04-05 MED ORDER — FENTANYL CITRATE (PF) 100 MCG/2ML IJ SOLN
INTRAMUSCULAR | Status: AC | PRN
  Administered 2022-04-05: 50 ug via INTRAVENOUS

## 2022-04-05 MED ORDER — LIDOCAINE VISCOUS HCL 2 % MT SOLN
OROMUCOSAL | Status: AC
  Filled 2022-04-05: qty 15

## 2022-04-05 MED ORDER — MIDAZOLAM HCL 2 MG/2ML IJ SOLN
INTRAMUSCULAR | Status: AC | PRN
  Administered 2022-04-05: 2 mg via INTRAVENOUS

## 2022-04-05 MED ORDER — FENTANYL CITRATE (PF) 100 MCG/2ML IJ SOLN
INTRAMUSCULAR | Status: AC
  Filled 2022-04-05: qty 2

## 2022-04-05 NOTE — CV Procedure (Signed)
Transesophageal echocardiogram preliminary report  Carlos Nolan [id number] Jan 24, 1966  Preliminary diagnosis  Stroke with possible embolic source  Postprocedural diagnosis  Normal LV systolic function no apparent source of embolus  Time out A timeout was performed by the nursing staff and physicians specifically identifying the procedure performed, identification of the patient, the type of sedation, all allergies and medications, all pertinent medical history, and presedation assessment of nasopharynx. The patient and or family understand the risks of the procedure including the rare risks of death, stroke, heart attack, esophogeal perforation, sore throat, and reaction to medications given.  Moderate sedation During this procedure the patient has received Versed 2 milligrams and fentanyl 50 micrograms to achieve appropriate moderate sedation.  The patient had continued monitoring of heart rate, oxygenation, blood pressure, respiratory rate, and extent of signs of sedation throughout the entire procedure.  The patient received this moderate sedation over a period of 15 minutes.  Both the nursing staff and I were present during the procedure when the patient had moderate sedation for 100% of the time.  Treatment considerations  No further cardiac intervention due to no apparent cardiac source of embolus  For further details of transesophageal echocardiogram please refer to final report.  Signed,  Corey Skains M.D. [facility] 04/05/2022 8:28 AM

## 2022-04-05 NOTE — Progress Notes (Signed)
*  PRELIMINARY RESULTS* Echocardiogram Echocardiogram Transesophageal has been performed.  Carlos Nolan 04/05/2022, 8:34 AM

## 2022-04-06 ENCOUNTER — Ambulatory Visit: Payer: 59

## 2022-04-06 ENCOUNTER — Encounter: Payer: Self-pay | Admitting: Internal Medicine

## 2022-04-06 DIAGNOSIS — M6281 Muscle weakness (generalized): Secondary | ICD-10-CM

## 2022-04-06 DIAGNOSIS — R278 Other lack of coordination: Secondary | ICD-10-CM

## 2022-04-06 DIAGNOSIS — R262 Difficulty in walking, not elsewhere classified: Secondary | ICD-10-CM

## 2022-04-06 DIAGNOSIS — R2689 Other abnormalities of gait and mobility: Secondary | ICD-10-CM

## 2022-04-06 NOTE — Therapy (Signed)
OUTPATIENT PHYSICAL THERAPY NEURO TREATMENT   Patient Name: ROSARIO KUSHNER MRN: [id number] DOB:1966-03-26, 56 y.o., male Today's Date: 04/06/2022   PCP: Leonel Ramsay, MD REFERRING PROVIDER: Leonel Ramsay, MD   PT End of Session - 04/06/22 1020     Visit Number 6    Number of Visits 24    Date for PT Re-Evaluation 04/01/22    Progress Note Due on Visit 10    PT Start Time 1020    PT Stop Time 1100    PT Time Calculation (min) 40 min    Equipment Utilized During Treatment Gait belt    Activity Tolerance Patient tolerated treatment well    Behavior During Therapy [facility] for tasks assessed/performed                Past Medical History:  Diagnosis Date   Atrial fibrillation ([place])    COVID-19 01/2019   Sept 2020   Stroke [facility])    03/10/2022   Past Surgical History:  Procedure Laterality Date   APPENDECTOMY     CARDIAC ELECTROPHYSIOLOGY STUDY AND ABLATION  04/24/2015   COLONOSCOPY WITH PROPOFOL N/A 08/11/2020   Procedure: COLONOSCOPY WITH PROPOFOL;  Surgeon: Wilford Corner, MD;  Location: [facility];  Service: Endoscopy;  Laterality: N/A;   ELBOW FRACTURE SURGERY     ESOPHAGOGASTRODUODENOSCOPY N/A 01/11/2016   Procedure: ESOPHAGOGASTRODUODENOSCOPY (EGD);  Surgeon: Mauri Pole, MD;  Location: [facility];  Service: Endoscopy;  Laterality: N/A;   FEMUR FRACTURE SURGERY     HARDWARE REMOVAL Left 08/22/2019   Procedure: HARDWARE REMOVAL FROM LEFT ELBOW;  Surgeon: Corky Mull, MD;  Location: [facility] ORS;  Service: Orthopedics;  Laterality: Left;   LOOP RECORDER INSERTION N/A 01/21/2020   Procedure: LOOP RECORDER INSERTION;  Surgeon: Isaias Cowman, MD;  Location: [place] CV LAB;  Service: Cardiovascular;  Laterality: N/A;   POLYPECTOMY  08/11/2020   Procedure: POLYPECTOMY;  Surgeon: Wilford Corner, MD;  Location: [facility]OS[facility];  Service: Endoscopy;;   SHOULDER ARTHROSCOPY Right 06/15/2016   Procedure: Arthroscopic labral debridement,  arthroscopic subacromial decompression, and mini-open bursectomy with exploration of rotator cuff, right shoulder;  Surgeon: Corky Mull, MD;  Location: [place];  Service: Orthopedics;  Laterality: Right;   TEE WITHOUT CARDIOVERSION N/A 04/05/2022   Procedure: TRANSESOPHAGEAL ECHOCARDIOGRAM (TEE);  Surgeon: Corey Skains, MD;  Location: [facility] ORS;  Service: Cardiovascular;  Laterality: N/A;   Patient Active Problem List   Diagnosis Date Noted   Recurrent strokes ([place]) 03/24/2022   Acute ischemic stroke ([place]) 03/10/2022   Essential hypertension 03/10/2022   Hyperlipidemia 03/10/2022   Atrial fibrillation, chronic ([place]) 03/10/2022   History of adenomatous polyp of colon 08/11/2020   Stroke (cerebrum) ([place]) 12/26/2019   Acute thalamic infarction ([place]) 12/25/2019   S/P ablation of atrial fibrillation 12/25/2019   Food impaction of esophagus     ONSET DATE: 03/10/22  REFERRING DIAG:   H70.26 (ICD-10-CM) - Personal history of transient ischemic attack (TIA), and cerebral infarction without residual deficits  I69.90 (ICD-10-CM) - Unspecified sequelae of unspecified cerebrovascular disease    THERAPY DIAG:   Muscle weakness (generalized)  Other lack of coordination  Difficulty in walking, not elsewhere classified  Imbalance  Other abnormalities of gait and mobility  Rationale for Evaluation and Treatment: Rehabilitation  SUBJECTIVE:  SUBJECTIVE STATEMENT: Pt reports not doing much yesterday.  Pt had a TEE performed yesterday morning and slept for most of the day.  Pt notes that they have noticed pt going in and out of a-fib after having the loop recorder since the stroke 2 years ago.   PERTINENT HISTORY:  Pt is a 56 year old male with history of lacunar infarct with left upper  extremity weakness and baseline difficulty walking, hypertension, currently on Xarelto, neuropathy, hyperlipidemia chronic back pain neck pain with numbness and tingling who presents emergency department for chief concerns of left-sided weakness. Per MRI impression: "2 cm acute ischemic nonhemorrhagic infarct involving the right  frontal centrum semi ovale".    PAIN: Are you having pain? Yes, 4-5/10 in L foot, hand.  Depending on the pressure applied, also in the L knee.   PRECAUTIONS: None  WEIGHT BEARING RESTRICTIONS: No  FALLS: Has patient fallen in last 6 months? No  LIVING ENVIRONMENT:  Lives with: lives with their spouse Lives in: House/apartment Stairs: Yes: Internal: 13 steps; on right going up and External: 4 steps; can reach both Has following equipment at home: Single point cane   PLOF: Requires assistive device for independence   PATIENT GOALS:   Pt states he wants to be able walk without a limp and a without a cane.  Pt wants to be able to walk up/down stairs with increased confidence and lack of fear of falling.  Pt wants to be able to get back to being able to ride the bike and play golf again.   OBJECTIVE:   DIAGNOSTIC FINDINGS:   EXAM: MRI HEAD WITHOUT CONTRAST  IMPRESSION: 1. 2 cm acute ischemic nonhemorrhagic infarct involving the right frontal centrum semi ovale. 2. Underlying moderately advanced chronic microvascular ischemic disease with a few scattered remote lacunar infarcts about the hemispheric cerebral white matter and thalami.  COGNITION: Overall cognitive status: Within functional limits for tasks assessed   SENSATION: Decreased sensation on the dorsum of the foot and along the medial portion of the L calf  COORDINATION: Pt has coordination deficits when performing the heel-shin method on the L side Pt has coordination deficits when performing the rapid supination/pronation on the L side Decreased thumb to finger rapid touch on the L  side  POSTURE: No Significant postural limitations  LOWER EXTREMITY ROM:     Active  Right Eval Left Eval  Hip flexion    Hip extension    Hip abduction    Hip adduction    Hip internal rotation    Hip external rotation    Knee flexion    Knee extension    Ankle dorsiflexion    Ankle plantarflexion    Ankle inversion    Ankle eversion     (Blank rows = not tested)  LOWER EXTREMITY MMT:    MMT Right Eval Left Eval  Hip flexion 5 4-  Hip extension    Hip abduction    Hip adduction    Hip internal rotation    Hip external rotation    Knee flexion 5 4  Knee extension 5 4-  Ankle dorsiflexion 5 3+  Ankle plantarflexion    Ankle inversion    Ankle eversion    (Blank rows = not tested)  TRANSFERS: Assistive device utilized: Single point cane  Sit to stand: Modified independence Stand to sit: Modified independence Chair to chair: Modified independence  GAIT: Gait pattern: decreased arm swing- Left, decreased step length- Right, decreased stance time- Left, decreased stride length,  decreased hip/knee flexion- Left, decreased ankle dorsiflexion- Left, and poor foot clearance- Left Distance walked: 67f Assistive device utilized: None Level of assistance: SBA Comments: Pt stable, just weak on the L side.  FUNCTIONAL TESTS:  5 times sit to stand: 35.72 Timed up and go (TUG): 18.82 sec without AD 6 minute walk test: 535 ft 10 meter walk test: 16.18 Functional gait assessment: 17/30  PATIENT SURVEYS:  FOTO 41/59  TODAY'S TREATMENT:                                  Neuro:  Korebalance Tux Racer on extra mode, continuous balance/weight shifting exercises x5 minutes, x2 attempts Korebalance NeverBall, Levels I-V with bonus round performed as well Ambulation within the hallway with vertical head turns, length of the hallway x2 Ambulation within the hallway with horizontal head turns, length of the hallway x2     PATIENT EDUCATION: Education details: Pt  educated on role of PT and services provided during current POC, along with prognosis and information about the clinic. Person educated: Patient Education method: Explanation Education comprehension: verbalized understanding    HOME EXERCISE PROGRAM:  Access Code: [email or web address]: [email or web address] Date: 03/23/2022 Prepared by: JRaleigh Nation Exercises - Supine Bridge  - 1 x daily - 7 x weekly - 3 sets - 10 reps - Clamshell  - 1 x daily - 7 x weekly - 3 sets - 10 reps - Sidelying Reverse Clamshell  - 1 x daily - 7 x weekly - 3 sets - 10 reps - Sidelying Bent Knee Lift at 45 Degrees  - 1 x daily - 7 x weekly - 3 sets - 10 reps - Supine Hip Adduction Isometric with Ball  - 1 x daily - 7 x weekly - 3 sets - 10 reps - Supine Figure 4 Piriformis Stretch  - 1 x daily - 7 x weekly - 3 sets - 10 reps - Supine Piriformis Stretch with Leg Straight  - 1 x daily - 7 x weekly - 3 sets - 10 reps   GOALS: Goals reviewed with patient? Yes  SHORT TERM GOALS: Target date: 04/26/2022  Pt will be independent with HEP in order to demonstrate increased ability to perform tasks related to occupation/hobbies. Baseline: Goal status: INITIAL  LONG TERM GOALS: Target date: 06/10/2022  1.  Patient (> 674years old) will complete five times sit to stand test in < 15 seconds indicating an increased LE strength and improved balance. Baseline: 35.72 Goal status: INITIAL  2.  Patient will increase FOTO score to equal to or greater than 59 to demonstrate statistically significant improvement in mobility and quality of life.  Baseline: 41 Goal status: INITIAL   3.  Patient will increase FGA score by > 4 points to demonstrate decreased fall risk during functional activities. Baseline: 17/30 Goal status: INITIAL   4.  Patient will reduce timed up and go to <11 seconds to reduce fall risk and demonstrate improved transfer/gait ability. Baseline: 18.82 sec Goal status: INITIAL  5.  Patient  will increase 10 meter walk test to >1.036m as to improve gait speed for better community ambulation and to reduce fall risk. Baseline: 16.18 sec Goal status: INITIAL  6.  Patient will increase six minute walk test distance to >1000 for progression to community ambulator and improve gait ability Baseline: 535 ft Goal status: INITIAL    ASSESSMENT:  CLINICAL IMPRESSION:  Pt performed well with  the tasks given and performed ambulation with head turns in the hallway to challenge overall balance while performing dynamic tasks and visual tracking.  Pt also spent extensive amount of time on the [facility] test and was able to achieve high scores on the events he performed.  Pt is making good progress towards goals and will continue to benefit from skilled therapy to address remaining deficits.      OBJECTIVE IMPAIRMENTS: Abnormal gait, decreased activity tolerance, decreased balance, decreased coordination, decreased mobility, difficulty walking, decreased strength, and impaired UE functional use.   ACTIVITY LIMITATIONS: carrying, lifting, bending, standing, squatting, transfers, bed mobility, bathing, dressing, and locomotion level  PARTICIPATION LIMITATIONS: cleaning, laundry, shopping, community activity, occupation, and yard work  PERSONAL FACTORS: Age, Past/current experiences, Time since onset of injury/illness/exacerbation, and 1-2 comorbidities: stroke, Afib  are also affecting patient's functional outcome.   REHAB POTENTIAL: Good  CLINICAL DECISION MAKING: Stable/uncomplicated  EVALUATION COMPLEXITY: Moderate  PLAN:  PT FREQUENCY: 2x/week  PT DURATION: 12 weeks  PLANNED INTERVENTIONS: Therapeutic exercises, Therapeutic activity, Neuromuscular re-education, Balance training, Gait training, Patient/Family education, Self Care, Joint mobilization, Vestibular training, Canalith repositioning, Dry Needling, Spinal mobilization, Cryotherapy, Moist heat, and Manual therapy  PLAN  FOR NEXT SESSION:   Introduce more balance related activities.  Assess compliance with HEP.    Gwenlyn Saran, PT, DPT Physical Therapist- [facility]  04/06/22, 3:32 PM

## 2022-04-06 NOTE — Therapy (Unsigned)
OUTPATIENT OCCUPATIONAL THERAPY TREATMENT  Patient Name: Carlos Nolan MRN: [id number] DOB:[birth year], 56 y.o., male Today's Date: 04/07/2022  PCP: Dr. Leonel Ramsay (PCP) REFERRING PROVIDER: Dr. Gurney Maxin (Neurologist)    OT End of Session - 04/04/22 1627       Visit Number 4    Number of Visits 24     Date for OT Re-Evaluation 06/15/22     OT Start Time 1102    OT Stop Time 5188    OT Time Calculation (min) 43 min     Activity Tolerance Patient tolerated treatment well     Behavior During Therapy [facility] for tasks assessed/performed       Past Medical History:  Diagnosis Date   Atrial fibrillation ([place])    COVID-19 01/2019   Sept 2020   Stroke [facility])    03/10/2022   Past Surgical History:  Procedure Laterality Date   APPENDECTOMY     CARDIAC ELECTROPHYSIOLOGY STUDY AND ABLATION  04/24/2015   COLONOSCOPY WITH PROPOFOL N/A 08/11/2020   Procedure: COLONOSCOPY WITH PROPOFOL;  Surgeon: Wilford Corner, MD;  Location: [facility] END[facility]COPY;  Service: Endoscopy;  Laterality: N/A;   ELBOW FRACTURE SURGERY     ESOPHAGOGASTRODUODENOSCOPY N/A 01/11/2016   Procedure: ESOPHAGOGASTRODUODENOSCOPY (EGD);  Surgeon: Mauri Pole, MD;  Location: [facility];  Service: Endoscopy;  Laterality: N/A;   FEMUR FRACTURE SURGERY     HARDWARE REMOVAL Left 08/22/2019   Procedure: HARDWARE REMOVAL FROM LEFT ELBOW;  Surgeon: Corky Mull, MD;  Location: [facility] ORS;  Service: Orthopedics;  Laterality: Left;   LOOP RECORDER INSERTION N/A 01/21/2020   Procedure: LOOP RECORDER INSERTION;  Surgeon: Isaias Cowman, MD;  Location: [facility];  Service: Cardiovascular;  Laterality: N/A;   POLYPECTOMY  08/11/2020   Procedure: POLYPECTOMY;  Surgeon: Wilford Corner, MD;  Location: [facility];  Service: Endoscopy;;   SHOULDER ARTHROSCOPY Right 06/15/2016   Procedure: Arthroscopic labral debridement, arthroscopic subacromial decompression, and mini-open bursectomy with exploration of rotator  cuff, right shoulder;  Surgeon: Corky Mull, MD;  Location: [place];  Service: Orthopedics;  Laterality: Right;   TEE WITHOUT CARDIOVERSION N/A 04/05/2022   Procedure: TRANSESOPHAGEAL ECHOCARDIOGRAM (TEE);  Surgeon: Corey Skains, MD;  Location: [facility] ORS;  Service: Cardiovascular;  Laterality: N/A;   Patient Active Problem List   Diagnosis Date Noted   Recurrent strokes ([place]) 03/24/2022   Acute ischemic stroke ([place]) 03/10/2022   Essential hypertension 03/10/2022   Hyperlipidemia 03/10/2022   Atrial fibrillation, chronic ([place]) 03/10/2022   History of adenomatous polyp of colon 08/11/2020   Stroke (cerebrum) ([place]) 12/26/2019   Acute thalamic infarction ([place]) 12/25/2019   S/P ablation of atrial fibrillation 12/25/2019   Food impaction of esophagus     ONSET DATE: 03/10/22  REFERRING DIAG: CVA with L sided weakness  THERAPY DIAG:  Muscle weakness (generalized)  Other lack of coordination  Rationale for Evaluation and Treatment: Rehabilitation  SUBJECTIVE:  SUBJECTIVE STATEMENT: Pt reports his TEE procedure went well yesterday.  No significant findings, per pt.  Pt accompanied by: self  PERTINENT HISTORY: Per Dr. Freddi Starr note on 03/12/22: Carlos Nolan is a 56 year old male with history of lacunar infarct with left upper extremity weakness and baseline difficulty walking, hypertension, currently on Xarelto, neuropathy, hyperlipidemia chronic back pain neck pain with numbness and tingling who presented to the ED for evaluation of acute onset  left-sided weakness. He was admitted for stroke evaluation and MRI brain confirmed a 2 cm acute infarct involving the right  frontal centrum semi ovale.  CTA head and neck was negative for large vessel occlusions or other significant proximal stenosis. Pt had another stroke in Aug of 2021 with residual numbness in the LUE, though this had improved.   PRECAUTIONS: Sensory impairment throughout the LUE, fall  precautions  WEIGHT BEARING RESTRICTIONS: No  PAIN:  Are you having pain? Yes: NPRS scale: 4/10 Pain location: L side of neck into the shoulder, L upper arm, L hand, L foot Pain description: burning Aggravating factors: increased activity Relieving factors: massage, Gabapentin, rest  FALLS: Has patient fallen in last 6 months? Yes. Number of falls 1 (yesterday, lost balance getting up from chair)  LIVING ENVIRONMENT: Lives with: lives with their spouse Lives in: Other 2 level home, master bedroom on main floor Stairs: Yes: Internal: 13 steps; on right going up, 2 rails to go through garage (4 steps) Has following equipment at home: Single point cane, grab bars in shower, shower chair for walk in shower (no longer having to use)  PLOF: Independent  PATIENT GOALS: Improve coordination in LUE; return to work (IT), golf, wood working  OBJECTIVE:   HAND DOMINANCE: Right  ADLs: Overall ADLs: modified indep/extra time d/t coordination deficits  Transfers/ambulation related to ADLs: indep; recently reduced use of cane.  Not using today. Eating: difficulty cutting food, difficulty opening new packages and containers  Grooming: indep with dominant hand UB Dressing: extra time with clothing fasteners LB Dressing: difficulty tying shoes Toileting: indep Bathing: modified indep Tub Shower transfers: modified indep Equipment:  single point cane, grab bar in shower, shower chair   IADLs: Shopping: using motorized cart in store (supv from spouse) Light housekeeping: spouse manages at baseline Meal Prep: pt not participating in any light meal prep right now d/t balance and coordination deficits Community mobility: short distances, newly walking without cane Medication management: spouse manages meds at baseline Financial management: indep  Handwriting: N/A (pt R hand dominant)  MOBILITY STATUS: Hx of falls  POSTURE COMMENTS:  No Significant postural limitations Sitting balance: no  impairment  ACTIVITY TOLERANCE: Activity tolerance: Pt estimates 1 hour of activity would wear him out.  FUNCTIONAL OUTCOME MEASURES: FOTO: 57, predicted 71  UPPER EXTREMITY ROM:    Active ROM Right eval Left eval  Shoulder flexion 180 130  Shoulder abduction 180 180  Shoulder adduction    Shoulder extension    Shoulder internal rotation    Shoulder external rotation    (Blank rows = not tested)  UPPER EXTREMITY MMT:     MMT Right eval Left eval  Shoulder flexion 4- 5  Shoulder abduction 4- 5  Shoulder adduction    Shoulder extension    Shoulder internal rotation 4- 5  Shoulder external rotation 3+ 5  Middle trapezius    Lower trapezius    Elbow flexion    Elbow extension    Wrist flexion 4 5  Wrist extension 4 5  Wrist ulnar deviation    Wrist radial deviation    Wrist pronation    Wrist supination    (Blank rows = not tested)  HAND FUNCTION: Grip strength: Right: 104 lbs; Left: 64 lbs, Lateral pinch: Right: 23 lbs, Left: 19 lbs, and 3 point pinch: Right: 20 lbs, Left: 11 lbs  COORDINATION: Finger Nose Finger test: L slow and moderately ataxic  9 Hole Peg test: Right: 26 sec; Left: 44 sec  SENSATION: Light touch: Impaired , numbness/burning in L hand, light touch intact, decreased proprioception in L hand.  PRAXIS: Impaired:  Motor planning   TODAY'S TREATMENT:                                                                                                                              Therapeutic Exercise: Facilitated L grip strengthening with hand gripper set at heavy resistance with 1 green band and 1 blue band to complete 3 sets 10 reps beginning of session, 2 more sets 10 reps end of session.  Facilitated pinch strengthening with use of therapy resistant clothespins to target lateral and 3 point pinch of L hand.  Clipped pins onto vertical dowel for 1 set each pinch type for each color, min vc for prehension patterns.  Facilitated L shoulder strengthening  with dowel climb x3 reps, min vc for technique and forward lean into dowel to achieve max end range stretch for shoulder flexion.  Completed dowel strengthening exercises for shoulder flex, ER, abd, shoulder IR and ext for 1 set 10 reps with 3.5# dowel; increased to 5# dowel for 2nd set.  Min vc for form and technique.    Neuromuscular Reeducation: Facilitated [place] reaching to place clips onto designated numbers on ruler.  Ruler positioned to facilitate L shoulder abduction and worked with pronated forearm when reaching to simulate the way pt reaches with his bank card into an ATM machine.  Practiced quick alternating movements clipping and unclipping clothespins on a horizontal dowel, rotating forearm into pron/supination with alternating pins.  Extra time to line up pins on dowel when pins were not directly in front of pt.   PATIENT EDUCATION: Education details: theraputty exercises, precautions with sensory impairment in the L hand, cane stretches for L shoulder flexibility  Person educated: Patient Education method: Customer service manager Education comprehension: verbalized understanding, returned demonstration, and needs further education  HOME EXERCISE PROGRAM: Theraputty, cane stretches for shoulder flexibility    GOALS: Goals reviewed with patient? Yes  SHORT TERM GOALS: Target date: 05/04/22  Pt will be indep with HEP for increasing LUE strength and coordination for daily tasks.  Baseline: Initiated theraputty exercises Goal status: INITIAL   LONG TERM GOALS: Target date: 06/15/22  Pt will increase FOTO score to 65 or better to improve self perceived performance with daily tasks.  Baseline: 57  Goal status: INITIAL  2.  Pt will increase L grip strength by 20 or more lbs to improve ability to hold and carry ADL supplies.   Baseline: L grip 64 lbs, R 104 lbs Goal status: INITIAL  3.  Pt will increase L 3 point pinch strength by 5 or more lbs to improve ability to pick up  and manipulate small objects.  Baseline: L 11 lbs, R 20 lbs Goal status: INITIAL  4.  Pt will improve L hand FMC (9 hole by 10 sec or more) to improve efficiency with picking up pills and small ADL supplies from table top.   Baseline: L 9 hole 44 s, R 26 Goal status: INITIAL  5.  Pt will increase LUE GMC to enable reaching out car window for ATM with good accuracy/steady arm. Baseline: Difficult/moderate ataxia Goal status: INITIAL  6.  Pt will increase LUE strength by 1/2 grade or more to be able to swing a golf club with baseline R/L handling distribution. Baseline: Pt reports he overcompensates with the RUE. Goal status: INITIAL  ASSESSMENT:  CLINICAL IMPRESSION: Pt tolerated all therapeutic exercises well this date.  Pt required intermittent min vc to minimize a mild L shoulder hike, and intermittent rest breaks d/t fatigue in LUE.  Pt tolerated 5# dowel well for bilat shoulder strengthening with min vc for posture/form.  Pt demonstrates decreased gross motor coordination of left UE with reaching tasks and fatigues quickly with multidirectional reach.  Focused on reaching toward a target using shoulder abduction to simulate reaching for ATM.  Pt requires extra time to control his reach toward a target.  Practiced quick alternating movements clipping and unclipping clothespins on a horizontal dowel, rotating forearm into pron/supination with alternating pins.  Extra time to line up pins on dowel when pins were not directly in front of pt.  Will continue to work towards goals in plan of care to maximize safety and independence in necessary daily ADL and IADL tasks at home, work and in the community.     PERFORMANCE DEFICITS: in functional skills including ADLs, IADLs, coordination, dexterity, sensation, ROM, strength, pain, Fine motor control, Gross motor control, mobility, balance, endurance, decreased knowledge of precautions, and UE functional use. IMPAIRMENTS: are limiting patient from  ADLs, IADLs, work, and leisure.   CO-MORBIDITIES: may have co-morbidities  that affects occupational performance. Patient will benefit from skilled OT to address above impairments and improve overall function.  MODIFICATION OR ASSISTANCE TO COMPLETE EVALUATION: No modification of tasks or assist necessary to complete an evaluation.  OT OCCUPATIONAL PROFILE AND HISTORY: Problem focused assessment: Including review of records relating to presenting problem.  CLINICAL DECISION MAKING: Moderate - several treatment options, min-mod task modification necessary  REHAB POTENTIAL: Good  EVALUATION COMPLEXITY: Moderate    PLAN:  OT FREQUENCY: 2x/week  OT DURATION: 12 weeks  PLANNED INTERVENTIONS: self care/ADL training, therapeutic exercise, therapeutic activity, neuromuscular re-education, manual therapy, balance training, moist heat, cryotherapy, patient/family education, and DME and/or AE instructions   CONSULTED AND AGREED WITH PLAN OF CARE: Patient  PLAN FOR NEXT SESSION: Continue with ROM, strengthening and coordination tasks for daily activities.    Leta Speller, MS, OTR/L   Darleene Cleaver, OT 04/07/2022, 8:02 PM

## 2022-04-13 ENCOUNTER — Ambulatory Visit: Payer: 59

## 2022-04-13 ENCOUNTER — Ambulatory Visit: Payer: 59 | Attending: Infectious Diseases

## 2022-04-13 DIAGNOSIS — R262 Difficulty in walking, not elsewhere classified: Secondary | ICD-10-CM | POA: Diagnosis present

## 2022-04-13 DIAGNOSIS — M6281 Muscle weakness (generalized): Secondary | ICD-10-CM | POA: Diagnosis present

## 2022-04-13 DIAGNOSIS — R2689 Other abnormalities of gait and mobility: Secondary | ICD-10-CM | POA: Diagnosis present

## 2022-04-13 DIAGNOSIS — R278 Other lack of coordination: Secondary | ICD-10-CM | POA: Diagnosis present

## 2022-04-13 NOTE — Therapy (Unsigned)
OUTPATIENT OCCUPATIONAL THERAPY TREATMENT  Patient Name: Carlos Nolan MRN: [id number] DOB:June 07, 1965, 56 y.o., male Today's Date: 04/14/2022  PCP: Dr. Leonel Ramsay (PCP) REFERRING PROVIDER: Dr. Gurney Maxin (Neurologist)   OT End of Session - 04/13/22 1111     Visit Number 5    Number of Visits 24    Date for OT Re-Evaluation 06/15/22    OT Start Time 1100    OT Stop Time 1145    OT Time Calculation (min) 45 min    Activity Tolerance Patient tolerated treatment well    Behavior During Therapy [facility] for tasks assessed/performed            Past Medical History:  Diagnosis Date   Atrial fibrillation ([place])    COVID-19 01/2019   Sept 2020   Stroke [place])    03/10/2022   Past Surgical History:  Procedure Laterality Date   APPENDECTOMY     CARDIAC ELECTROPHYSIOLOGY STUDY AND ABLATION  04/24/2015   COLONOSCOPY WITH PROPOFOL N/A 08/11/2020   Procedure: COLONOSCOPY WITH PROPOFOL;  Surgeon: Wilford Corner, MD;  Location: [facility];  Service: Endoscopy;  Laterality: N/A;   ELBOW FRACTURE SURGERY     ESOPHAGOGASTRODUODENOSCOPY N/A 01/11/2016   Procedure: ESOPHAGOGASTRODUODENOSCOPY (EGD);  Surgeon: Mauri Pole, MD;  Location: [facility];  Service: Endoscopy;  Laterality: N/A;   FEMUR FRACTURE SURGERY     HARDWARE REMOVAL Left 08/22/2019   Procedure: HARDWARE REMOVAL FROM LEFT ELBOW;  Surgeon: Corky Mull, MD;  Location: [facility];  Service: Orthopedics;  Laterality: Left;   LOOP RECORDER INSERTION N/A 01/21/2020   Procedure: LOOP RECORDER INSERTION;  Surgeon: Isaias Cowman, MD;  Location: [place] [facility];  Service: Cardiovascular;  Laterality: N/A;   POLYPECTOMY  08/11/2020   Procedure: POLYPECTOMY;  Surgeon: Wilford Corner, MD;  Location: [facility];  Service: Endoscopy;;   SHOULDER ARTHROSCOPY Right 06/15/2016   Procedure: Arthroscopic labral debridement, arthroscopic subacromial decompression, and mini-open bursectomy with exploration of rotator  cuff, right shoulder;  Surgeon: Corky Mull, MD;  Location: [place];  Service: Orthopedics;  Laterality: Right;   TEE WITHOUT CARDIOVERSION N/A 04/05/2022   Procedure: TRANSESOPHAGEAL ECHOCARDIOGRAM (TEE);  Surgeon: Corey Skains, MD;  Location: [facility];  Service: Cardiovascular;  Laterality: N/A;   Patient Active Problem List   Diagnosis Date Noted   Recurrent strokes ([place]) 03/24/2022   Acute ischemic stroke ([place]) 03/10/2022   Essential hypertension 03/10/2022   Hyperlipidemia 03/10/2022   Atrial fibrillation, chronic ([place]) 03/10/2022   History of adenomatous polyp of colon 08/11/2020   Stroke (cerebrum) ([place]) 12/26/2019   Acute thalamic infarction ([place]) 12/25/2019   S/P ablation of atrial fibrillation 12/25/2019   Food impaction of esophagus     ONSET DATE: 03/10/22  REFERRING DIAG: CVA with L sided weakness  THERAPY DIAG:  Muscle weakness (generalized)  Other lack of coordination  Rationale for Evaluation and Treatment: Rehabilitation  SUBJECTIVE:  SUBJECTIVE STATEMENT: Pt reports he was given the ok by his doctor to get back to work starting Monday.  Pt requests to take some measurements for his LUE next session so he can see where he's at to determine plan for therapy moving forward once he starts work next week.   Pt accompanied by: self  PERTINENT HISTORY: Per Dr. Freddi Starr note on 03/12/22: [name] is a 56 year old male with history of lacunar infarct with left upper extremity weakness and baseline difficulty walking, hypertension, currently on Xarelto, neuropathy, hyperlipidemia chronic back pain neck pain with  numbness and tingling who presented to the ED for evaluation of acute onset  left-sided weakness. He was admitted for stroke evaluation and MRI brain confirmed a 2 cm acute infarct involving the right frontal centrum semi ovale.  CTA head and neck was negative for large vessel occlusions or other significant proximal stenosis. Pt had  another stroke in Aug of 2021 with residual numbness in the LUE, though this had improved.   PRECAUTIONS: Sensory impairment throughout the LUE, fall precautions  WEIGHT BEARING RESTRICTIONS: No  PAIN:  Are you having pain? Yes: NPRS scale: 4/10 Pain location: L side of neck into the shoulder, L upper arm, L hand, L foot Pain description: burning Aggravating factors: increased activity Relieving factors: massage, Gabapentin, rest  FALLS: Has patient fallen in last 6 months? Yes. Number of falls 1 (yesterday, lost balance getting up from chair)  LIVING ENVIRONMENT: Lives with: lives with their spouse Lives in: Other 2 level home, master bedroom on main floor Stairs: Yes: Internal: 13 steps; on right going up, 2 rails to go through garage (4 steps) Has following equipment at home: Single point cane, grab bars in shower, shower chair for walk in shower (no longer having to use)  PLOF: Independent  PATIENT GOALS: Improve coordination in LUE; return to work (IT), golf, wood working  OBJECTIVE:   HAND DOMINANCE: Right  ADLs: Overall ADLs: modified indep/extra time d/t coordination deficits  Transfers/ambulation related to ADLs: indep; recently reduced use of cane.  Not using today. Eating: difficulty cutting food, difficulty opening new packages and containers  Grooming: indep with dominant hand UB Dressing: extra time with clothing fasteners LB Dressing: difficulty tying shoes Toileting: indep Bathing: modified indep Tub Shower transfers: modified indep Equipment:  single point cane, grab bar in shower, shower chair   IADLs: Shopping: using motorized cart in store (supv from spouse) Light housekeeping: spouse manages at baseline Meal Prep: pt not participating in any light meal prep right now d/t balance and coordination deficits Community mobility: short distances, newly walking without cane Medication management: spouse manages meds at baseline Financial management: indep   Handwriting: N/A (pt R hand dominant)  MOBILITY STATUS: Hx of falls  POSTURE COMMENTS:  No Significant postural limitations Sitting balance: no impairment  ACTIVITY TOLERANCE: Activity tolerance: Pt estimates 1 hour of activity would wear him out.  FUNCTIONAL OUTCOME MEASURES: FOTO: 57, predicted 71  UPPER EXTREMITY ROM:    Active ROM Right eval Left eval  Shoulder flexion 180 130  Shoulder abduction 180 180  Shoulder adduction    Shoulder extension    Shoulder internal rotation    Shoulder external rotation    (Blank rows = not tested)  UPPER EXTREMITY MMT:     MMT Right eval Left eval  Shoulder flexion 4- 5  Shoulder abduction 4- 5  Shoulder adduction    Shoulder extension    Shoulder internal rotation 4- 5  Shoulder external rotation 3+ 5  Middle trapezius    Lower trapezius    Elbow flexion    Elbow extension    Wrist flexion 4 5  Wrist extension 4 5  Wrist ulnar deviation    Wrist radial deviation    Wrist pronation    Wrist supination    (Blank rows = not tested)  HAND FUNCTION: Grip strength: Right: 104 lbs; Left: 64 lbs, Lateral pinch: Right: 23 lbs, Left: 19 lbs, and 3 point pinch: Right: 20 lbs, Left: 11 lbs  COORDINATION: Finger Nose Finger test: L slow and  moderately ataxic  9 Hole Peg test: Right: 26 sec; Left: 44 sec  SENSATION: Light touch: Impaired , numbness/burning in L hand, light touch intact, decreased proprioception in L hand.  PRAXIS: Impaired: Motor planning   TODAY'S TREATMENT:                                                                                                                              Neuromuscular Reeducation: Facilitated FMC/dexterity skills working to pick up Jamar pegs from non-skid surface, and Cutter skills working to place pegs into pegboard which was placed on an incline.  Pt was given initial vc before starting activity to avoid L shoulder hike, which pt was able to avoid when reaching forward and  laterally when pegboard was repositioned to challenge reach in a variety of planes.  Pt practiced picking up 1 and 2 pegs at a time, storing 1 in hand while placing another peg.  Pt had frequent dropping when working to move a peg from palm to fingertips in prep for discarding onto pegboard.  Pt worked with reaching and manipulation skills with jumbo pegs and small glass stones.  Pt practiced picking up stones from table, placing stones on top of upright jumbo pegs on table top with pegs positioned in front of pt then to the L to challenge forward and lateral reaching and precise reaching toward a target.  Pt was able to place and remove stones on pegs without knocking over all but 1 peg from a group of 7.   PATIENT EDUCATION: Education details: theraputty exercises, precautions with sensory impairment in the L hand, cane stretches for L shoulder flexibility  Person educated: Patient Education method: Customer service manager Education comprehension: verbalized understanding, returned demonstration, and needs further education  HOME EXERCISE PROGRAM: Theraputty, cane stretches for shoulder flexibility    GOALS: Goals reviewed with patient? Yes  SHORT TERM GOALS: Target date: 05/04/22  Pt will be indep with HEP for increasing LUE strength and coordination for daily tasks.  Baseline: Initiated theraputty exercises Goal status: INITIAL   LONG TERM GOALS: Target date: 06/15/22  Pt will increase FOTO score to 65 or better to improve self perceived performance with daily tasks.  Baseline: 57  Goal status: INITIAL  2.  Pt will increase L grip strength by 20 or more lbs to improve ability to hold and carry ADL supplies.   Baseline: L grip 64 lbs, R 104 lbs Goal status: INITIAL  3.  Pt will increase L 3 point pinch strength by 5 or more lbs to improve ability to pick up and manipulate small objects.  Baseline: L 11 lbs, R 20 lbs Goal status: INITIAL  4.  Pt will improve L hand FMC (9  hole by 10 sec or more) to improve efficiency with picking up pills and small ADL supplies from table top.   Baseline: L 9 hole 44 s, R 26 Goal status: INITIAL  5.  Pt will increase  LUE GMC to enable reaching out car window for ATM with good accuracy/steady arm. Baseline: Difficult/moderate ataxia Goal status: INITIAL  6.  Pt will increase LUE strength by 1/2 grade or more to be able to swing a golf club with baseline R/L handling distribution. Baseline: Pt reports he overcompensates with the RUE. Goal status: INITIAL  ASSESSMENT:  CLINICAL IMPRESSION: Pt showing improvement with forward and lateral reaching with the LUE without shoulder hike.  Pt states he's been consistent to work with his 5# dumbbells at home for BUE strengthening.  Pt did report that he tends to lower the weight in his L hand without knowing, thinking it's still up in the air.  OT reinforced benefits of performing these exercises in front of a mirror to benefit from the visual feedback d/t decreased proprioception on the L side.  Pt verbalized understanding.  Pt reports using LUE to reach when going through a drive through is not as difficult as it was.  Pt was given the ok to return to work on Monday and would like to take some measurements for his LUE next session so he can see where he's at to determine plan for therapy moving forward once he starts work next week.  Pt continues to be frustrated with frequent dropping when trying to pick up, store, and move small objects in hand from palm to fingertips, though these skills are improving.  Will continue to work towards goals in plan of care to maximize safety and independence in necessary daily ADL and IADL tasks at home, work and in the community.    PERFORMANCE DEFICITS: in functional skills including ADLs, IADLs, coordination, dexterity, sensation, ROM, strength, pain, Fine motor control, Gross motor control, mobility, balance, endurance, decreased knowledge of precautions,  and UE functional use. IMPAIRMENTS: are limiting patient from ADLs, IADLs, work, and leisure.   CO-MORBIDITIES: may have co-morbidities  that affects occupational performance. Patient will benefit from skilled OT to address above impairments and improve overall function.  MODIFICATION OR ASSISTANCE TO COMPLETE EVALUATION: No modification of tasks or assist necessary to complete an evaluation.  OT OCCUPATIONAL PROFILE AND HISTORY: Problem focused assessment: Including review of records relating to presenting problem.  CLINICAL DECISION MAKING: Moderate - several treatment options, min-mod task modification necessary  REHAB POTENTIAL: Good  EVALUATION COMPLEXITY: Moderate    PLAN:  OT FREQUENCY: 2x/week  OT DURATION: 12 weeks  PLANNED INTERVENTIONS: self care/ADL training, therapeutic exercise, therapeutic activity, neuromuscular re-education, manual therapy, balance training, moist heat, cryotherapy, patient/family education, and DME and/or AE instructions   CONSULTED AND AGREED WITH PLAN OF CARE: Patient  PLAN FOR NEXT SESSION: Continue with ROM, strengthening and coordination tasks for daily activities.    Leta Speller, MS, OTR/L   Darleene Cleaver, OT 04/14/2022, 8:51 AM

## 2022-04-13 NOTE — Therapy (Signed)
OUTPATIENT PHYSICAL THERAPY NEURO TREATMENT   Patient Name: Carlos Nolan MRN: [id number] DOB:25-Dec-1965, 56 y.o., male Today's Date: 04/14/2022   PCP: Leonel Ramsay, MD REFERRING PROVIDER: Leonel Ramsay, MD   PT End of Session - 04/13/22 1022     Visit Number 7    Number of Visits 24    Date for PT Re-Evaluation 04/01/22    Progress Note Due on Visit 10    PT Start Time 1016    PT Stop Time 1057    PT Time Calculation (min) 41 min    Equipment Utilized During Treatment Gait belt    Activity Tolerance Patient tolerated treatment well    Behavior During Therapy [facility] for tasks assessed/performed                Past Medical History:  Diagnosis Date   Atrial fibrillation ([place])    COVID-19 01/2019   Sept 2020   Stroke [facility])    03/10/2022   Past Surgical History:  Procedure Laterality Date   APPENDECTOMY     CARDIAC ELECTROPHYSIOLOGY STUDY AND ABLATION  04/24/2015   COLONOSCOPY WITH PROPOFOL N/A 08/11/2020   Procedure: COLONOSCOPY WITH PROPOFOL;  Surgeon: Wilford Corner, MD;  Location: [facility];  Service: Endoscopy;  Laterality: N/A;   ELBOW FRACTURE SURGERY     ESOPHAGOGASTRODUODENOSCOPY N/A 01/11/2016   Procedure: ESOPHAGOGASTRODUODENOSCOPY (EGD);  Surgeon: Mauri Pole, MD;  Location: [facility];  Service: Endoscopy;  Laterality: N/A;   FEMUR FRACTURE SURGERY     HARDWARE REMOVAL Left 08/22/2019   Procedure: HARDWARE REMOVAL FROM LEFT ELBOW;  Surgeon: Corky Mull, MD;  Location: [facility] ORS;  Service: Orthopedics;  Laterality: Left;   LOOP RECORDER INSERTION N/A 01/21/2020   Procedure: LOOP RECORDER INSERTION;  Surgeon: Isaias Cowman, MD;  Location: [place] CV [facility];  Service: Cardiovascular;  Laterality: N/A;   POLYPECTOMY  08/11/2020   Procedure: POLYPECTOMY;  Surgeon: Wilford Corner, MD;  [facility];  Service: Endoscopy;;   SHOULDER ARTHROSCOPY Right 06/15/2016   Procedure: Arthroscopic labral debridement,  arthroscopic subacromial decompression, and mini-open bursectomy with exploration of rotator cuff, right shoulder;  Surgeon: Corky Mull, MD;  Location: [place];  Service: Orthopedics;  Laterality: Right;   TEE WITHOUT CARDIOVERSION N/A 04/05/2022   Procedure: TRANSESOPHAGEAL ECHOCARDIOGRAM (TEE);  Surgeon: Corey Skains, MD;  Location: [facility] ORS;  Service: Cardiovascular;  Laterality: N/A;   Patient Active Problem List   Diagnosis Date Noted   Recurrent strokes ([place]) 03/24/2022   Acute ischemic stroke ([place]) 03/10/2022   Essential hypertension 03/10/2022   Hyperlipidemia 03/10/2022   Atrial fibrillation, chronic ([place]) 03/10/2022   History of adenomatous polyp of colon 08/11/2020   Stroke (cerebrum) ([place]) 12/26/2019   Acute thalamic infarction ([place]) 12/25/2019   S/P ablation of atrial fibrillation 12/25/2019   Food impaction of esophagus     ONSET DATE: 03/10/22  REFERRING DIAG:   Y19.50 (ICD-10-CM) - Personal history of transient ischemic attack (TIA), and cerebral infarction without residual deficits  I69.90 (ICD-10-CM) - Unspecified sequelae of unspecified cerebrovascular disease    THERAPY DIAG:   Muscle weakness (generalized)  Other lack of coordination  Difficulty in walking, not elsewhere classified  Imbalance  Other abnormalities of gait and mobility  Rationale for Evaluation and Treatment: Rehabilitation  SUBJECTIVE:  SUBJECTIVE STATEMENT: Pt reports he feels like he is just not walking as good as he would like- scuffing left LE at times. Denies any falls or pain.   PERTINENT HISTORY:  Pt is a 56 year old male with history of lacunar infarct with left upper extremity weakness and baseline difficulty walking, hypertension, currently on Xarelto, neuropathy,  hyperlipidemia chronic back pain neck pain with numbness and tingling who presents emergency department for chief concerns of left-sided weakness. Per MRI impression: "2 cm acute ischemic nonhemorrhagic infarct involving the right  frontal centrum semi ovale".    PAIN: Are you having pain? None today  PRECAUTIONS: None  WEIGHT BEARING RESTRICTIONS: No  FALLS: Has patient fallen in last 6 months? No  LIVING ENVIRONMENT:  Lives with: lives with their spouse Lives in: House/apartment Stairs: Yes: Internal: 13 steps; on right going up and External: 4 steps; can reach both Has following equipment at home: Single point cane   PLOF: Requires assistive device for independence   PATIENT GOALS:   Pt states he wants to be able walk without a limp and a without a cane.  Pt wants to be able to walk up/down stairs with increased confidence and lack of fear of falling.  Pt wants to be able to get back to being able to ride the bike and play golf again.   OBJECTIVE:   DIAGNOSTIC FINDINGS:   EXAM: MRI HEAD WITHOUT CONTRAST  IMPRESSION: 1. 2 cm acute ischemic nonhemorrhagic infarct involving the right frontal centrum semi ovale. 2. Underlying moderately advanced chronic microvascular ischemic disease with a few scattered remote lacunar infarcts about the hemispheric cerebral white matter and thalami.  COGNITION: Overall cognitive status: Within functional limits for tasks assessed   SENSATION: Decreased sensation on the dorsum of the foot and along the medial portion of the L calf  COORDINATION: Pt has coordination deficits when performing the heel-shin method on the L side Pt has coordination deficits when performing the rapid supination/pronation on the L side Decreased thumb to finger rapid touch on the L side  POSTURE: No Significant postural limitations  LOWER EXTREMITY ROM:     Active  Right Eval Left Eval  Hip flexion    Hip extension    Hip abduction    Hip  adduction    Hip internal rotation    Hip external rotation    Knee flexion    Knee extension    Ankle dorsiflexion    Ankle plantarflexion    Ankle inversion    Ankle eversion     (Blank rows = not tested)  LOWER EXTREMITY MMT:    MMT Right Eval Left Eval  Hip flexion 5 4-  Hip extension    Hip abduction    Hip adduction    Hip internal rotation    Hip external rotation    Knee flexion 5 4  Knee extension 5 4-  Ankle dorsiflexion 5 3+  Ankle plantarflexion    Ankle inversion    Ankle eversion    (Blank rows = not tested)  TRANSFERS: Assistive device utilized: Single point cane  Sit to stand: Modified independence Stand to sit: Modified independence Chair to chair: Modified independence  GAIT: Gait pattern: decreased arm swing- Left, decreased step length- Right, decreased stance time- Left, decreased stride length, decreased hip/knee flexion- Left, decreased ankle dorsiflexion- Left, and poor foot clearance- Left Distance walked: 91f Assistive device utilized: None Level of assistance: SBA Comments: Pt stable, just weak on the L side.  FUNCTIONAL TESTS:  5 times sit to stand: 35.72 Timed up and go (TUG): 18.82 sec without AD 6 minute walk test: 535 ft 10 meter walk test: 16.18 Functional gait assessment: 17/30  PATIENT SURVEYS:  FOTO 41/59  TODAY'S TREATMENT:                                  Therex:   Heel to toe gait sequencing- using Matrix cable system using 7.5 #- 2 sets of 12 reps (More difficult left vs. Right)  Verbally discussed (but not performed adding hip ext and hip abd to HEP - as patient reports having a cable system in his community gym) Biodex treadmill- gait trainer x 5 min-  Avg walk speed=0.8 m/s Avg step length=right-0.58; left- 0.50 m Time on each foot- right=55%; Left=45% Amb index score=78%  Seated Resistive ankle DF- RTB -2 sets x 10 Standing single leg calf raises 2 sets of 10 reps- minimal height with left LE complared to  right   PATIENT EDUCATION: Education details: Pt educated on role of PT and services provided during current POC, along with prognosis and information about the clinic. Person educated: Patient Education method: Explanation Education comprehension: verbalized understanding    HOME EXERCISE PROGRAM:  Access Code: [email or web address] URL: [email or web address] Date: 03/23/2022 Prepared by: [place] [employer]  Exercises - Supine Bridge  - 1 x daily - 7 x weekly - 3 sets - 10 reps - Clamshell  - 1 x daily - 7 x weekly - 3 sets - 10 reps - Sidelying Reverse Clamshell  - 1 x daily - 7 x weekly - 3 sets - 10 reps - Sidelying Bent Knee Lift at 45 Degrees  - 1 x daily - 7 x weekly - 3 sets - 10 reps - Supine Hip Adduction Isometric with Ball  - 1 x daily - 7 x weekly - 3 sets - 10 reps - Supine Figure 4 Piriformis Stretch  - 1 x daily - 7 x weekly - 3 sets - 10 reps - Supine Piriformis Stretch with Leg Straight  - 1 x daily - 7 x weekly - 3 sets - 10 reps   GOALS: Goals reviewed with patient? Yes  SHORT TERM GOALS: Target date: 04/26/2022  Pt will be independent with HEP in order to demonstrate increased ability to perform tasks related to occupation/hobbies. Baseline: Goal status: INITIAL  LONG TERM GOALS: Target date: 06/10/2022  1.  Patient (> 86 years old) will complete five times sit to stand test in < 15 seconds indicating an increased LE strength and improved balance. Baseline: 35.72 Goal status: INITIAL  2.  Patient will increase FOTO score to equal to or greater than 59 to demonstrate statistically significant improvement in mobility and quality of life.  Baseline: 41 Goal status: INITIAL   3.  Patient will increase FGA score by > 4 points to demonstrate decreased fall risk during functional activities. Baseline: 17/30 Goal status: INITIAL   4.  Patient will reduce timed up and go to <11 seconds to reduce fall risk and demonstrate improved transfer/gait  ability. Baseline: 18.82 sec Goal status: INITIAL  5.  Patient will increase 10 meter walk test to >1.22ms as to improve gait speed for better community ambulation and to reduce fall risk. Baseline: 16.18 sec Goal status: INITIAL  6.  Patient will increase six minute walk test distance to >1000 for progression to community ambulator and improve gait ability Baseline: 535 ft  Goal status: INITIAL    ASSESSMENT:  CLINICAL IMPRESSION:  Pt presented with excellent motivation for today's session. Treatment focused on ankle strengthening and normalizing gait pattern focusing on heel strike. Patient received feedback from instruction on Treadmill and later VC while using cable system. He improved with heel strike with VC and continues to make good progress towards goals. Patient will continue to benefit from skilled therapy to address remaining deficits and improved his overall functional mobility for improved quality of life and decreased risk of falling.       OBJECTIVE IMPAIRMENTS: Abnormal gait, decreased activity tolerance, decreased balance, decreased coordination, decreased mobility, difficulty walking, decreased strength, and impaired UE functional use.   ACTIVITY LIMITATIONS: carrying, lifting, bending, standing, squatting, transfers, bed mobility, bathing, dressing, and locomotion level  PARTICIPATION LIMITATIONS: cleaning, laundry, shopping, community activity, occupation, and yard work  PERSONAL FACTORS: Age, Past/current experiences, Time since onset of injury/illness/exacerbation, and 1-2 comorbidities: stroke, Afib  are also affecting patient's functional outcome.   REHAB POTENTIAL: Good  CLINICAL DECISION MAKING: Stable/uncomplicated  EVALUATION COMPLEXITY: Moderate  PLAN:  PT FREQUENCY: 2x/week  PT DURATION: 12 weeks  PLANNED INTERVENTIONS: Therapeutic exercises, Therapeutic activity, Neuromuscular re-education, Balance training, Gait training, Patient/Family  education, Self Care, Joint mobilization, Vestibular training, Canalith repositioning, Dry Needling, Spinal mobilization, Cryotherapy, Moist heat, and Manual therapy  PLAN FOR NEXT SESSION:   Introduce more balance related activities.  Assess compliance with HEP.    Gwenlyn Saran, PT, DPT Physical Therapist- [facility]  04/14/22, 7:20 AM

## 2022-04-15 ENCOUNTER — Ambulatory Visit: Payer: 59

## 2022-04-15 DIAGNOSIS — M6281 Muscle weakness (generalized): Secondary | ICD-10-CM

## 2022-04-15 DIAGNOSIS — R278 Other lack of coordination: Secondary | ICD-10-CM

## 2022-04-15 DIAGNOSIS — R2689 Other abnormalities of gait and mobility: Secondary | ICD-10-CM

## 2022-04-15 DIAGNOSIS — R262 Difficulty in walking, not elsewhere classified: Secondary | ICD-10-CM

## 2022-04-15 NOTE — Therapy (Addendum)
OUTPATIENT PHYSICAL THERAPY NEURO TREATMENT  Duplicate visit opened in error on 04/15/22   Patient Name: Carlos Nolan MRN: [id number] DOB:1965-08-29, 56 y.o., male Today's Date: 04/15/2022   PCP: Leonel Ramsay, MD REFERRING PROVIDER: Leonel Ramsay, MD   PT End of Session - 04/15/22 0932     Visit Number 8    Number of Visits 24    Date for PT Re-Evaluation 04/01/22    Progress Note Due on Visit 10    PT Start Time 0932    PT Stop Time 1015    PT Time Calculation (min) 43 min    Equipment Utilized During Treatment Gait belt    Activity Tolerance Patient tolerated treatment well    Behavior During Therapy [facility] for tasks assessed/performed              Past Medical History:  Diagnosis Date   Atrial fibrillation ([place])    COVID-19 01/2019   Sept 2020   Stroke [facility])    03/10/2022   Past Surgical History:  Procedure Laterality Date   APPENDECTOMY     CARDIAC ELECTROPHYSIOLOGY STUDY AND ABLATION  04/24/2015   COLONOSCOPY WITH PROPOFOL N/A 08/11/2020   Procedure: COLONOSCOPY WITH PROPOFOL;  Surgeon: Wilford Corner, MD;  [facility]: [facility];  Service: Endoscopy;  Laterality: N/A;   ELBOW FRACTURE SURGERY     ESOPHAGOGASTRODUODENOSCOPY N/A 01/11/2016   Procedure: ESOPHAGOGASTRODUODENOSCOPY (EGD);  Surgeon: Mauri Pole, MD;  Location: [facility];  Service: Endoscopy;  Laterality: N/A;   FEMUR FRACTURE SURGERY     HARDWARE REMOVAL Left 08/22/2019   Procedure: HARDWARE REMOVAL FROM LEFT ELBOW;  Surgeon: Corky Mull, MD;  Location: [facility];  Service: Orthopedics;  Laterality: Left;   LOOP RECORDER INSERTION N/A 01/21/2020   Procedure: LOOP RECORDER INSERTION;  Surgeon: Isaias Cowman, MD;  Location: [place] CV LAB;  Service: Cardiovascular;  Laterality: N/A;   POLYPECTOMY  08/11/2020   Procedure: POLYPECTOMY;  Surgeon: Wilford Corner, MD;  [facility];  Service: Endoscopy;;   SHOULDER ARTHROSCOPY Right 06/15/2016   Procedure:  Arthroscopic labral debridement, arthroscopic subacromial decompression, and mini-open bursectomy with exploration of rotator cuff, right shoulder;  Surgeon: Corky Mull, MD;  Location: [place];  Service: Orthopedics;  Laterality: Right;   TEE WITHOUT CARDIOVERSION N/A 04/05/2022   Procedure: TRANSESOPHAGEAL ECHOCARDIOGRAM (TEE);  Surgeon: Corey Skains, MD;  Location: [facility] OR[facility];  Service: Cardiovascular;  Laterality: N/A;   Patient Active Problem List   Diagnosis Date Noted   Recurrent strokes ([place]) 03/24/2022   Acute ischemic stroke ([place]) 03/10/2022   Essential hypertension 03/10/2022   Hyperlipidemia 03/10/2022   Atrial fibrillation, chronic ([place]) 03/10/2022   History of adenomatous polyp of colon 08/11/2020   Stroke (cerebrum) ([place]) 12/26/2019   Acute thalamic infarction ([place]) 12/25/2019   S/P ablation of atrial fibrillation 12/25/2019   Food impaction of esophagus     ONSET DATE: 03/10/22  REFERRING DIAG:   B76.28 (ICD-10-CM) - Personal history of transient ischemic attack (TIA), and cerebral infarction without residual deficits  I69.90 (ICD-10-CM) - Unspecified sequelae of unspecified cerebrovascular disease    THERAPY DIAG:   Muscle weakness (generalized)  Other lack of coordination  Difficulty in walking, not elsewhere classified  Imbalance  Other abnormalities of gait and mobility  Rationale for Evaluation and Treatment: Rehabilitation  SUBJECTIVE:  SUBJECTIVE STATEMENT:  Pt reports he felt like he was able to walk quicker into the hospital today, but noted that someone flew by him when walking and that made him feel a lot slower.  Pt notes he is going to [facility] in February and is hoping to get to the point that he is able to walk the parks without  difficulty.  PERTINENT HISTORY:    Pt is a 55 year old male with history of lacunar infarct with left upper extremity weakness and baseline difficulty walking, hypertension, currently on Xarelto, neuropathy, hyperlipidemia chronic back pain neck pain with numbness and tingling who presents emergency department for chief concerns of left-sided weakness. Per MRI impression: "2 cm acute ischemic nonhemorrhagic infarct involving the right  frontal centrum semi ovale".    PAIN: Are you having pain? None today  PRECAUTIONS: None  WEIGHT BEARING RESTRICTIONS: No  FALLS: Has patient fallen in last 6 months? No  LIVING ENVIRONMENT:  Lives with: lives with their spouse Lives in: House/apartment Stairs: Yes: Internal: 13 steps; on right going up and External: 4 steps; can reach both Has following equipment at home: Single point cane   PLOF: Requires assistive device for independence   PATIENT GOALS:   Pt states he wants to be able walk without a limp and a without a cane.  Pt wants to be able to walk up/down stairs with increased confidence and lack of fear of falling.  Pt wants to be able to get back to being able to ride the bike and play golf again.   OBJECTIVE:   DIAGNOSTIC FINDINGS:   EXAM: MRI HEAD WITHOUT CONTRAST  IMPRESSION: 1. 2 cm acute ischemic nonhemorrhagic infarct involving the right frontal centrum semi ovale. 2. Underlying moderately advanced chronic microvascular ischemic disease with a few scattered remote lacunar infarcts about the hemispheric cerebral white matter and thalami.  COGNITION: Overall cognitive status: Within functional limits for tasks assessed   SENSATION: Decreased sensation on the dorsum of the foot and along the medial portion of the L calf  COORDINATION: Pt has coordination deficits when performing the heel-shin method on the L side Pt has coordination deficits when performing the rapid supination/pronation on the L side Decreased  thumb to finger rapid touch on the L side  POSTURE: No Significant postural limitations  LOWER EXTREMITY MMT:    MMT Right Eval Left Eval  Hip flexion 5 4-  Hip extension    Hip abduction    Hip adduction    Hip internal rotation    Hip external rotation    Knee flexion 5 4  Knee extension 5 4-  Ankle dorsiflexion 5 3+  Ankle plantarflexion    Ankle inversion    Ankle eversion    (Blank rows = not tested)  TRANSFERS: Assistive device utilized: Single point cane  Sit to stand: Modified independence Stand to sit: Modified independence Chair to chair: Modified independence  GAIT: Gait pattern: decreased arm swing- Left, decreased step length- Right, decreased stance time- Left, decreased stride length, decreased hip/knee flexion- Left, decreased ankle dorsiflexion- Left, and poor foot clearance- Left Distance walked: 15f Assistive device utilized: None Level of assistance: SBA Comments: Pt stable, just weak on the L side.  FUNCTIONAL TESTS:  5 times sit to stand: 35.72 Timed up and go (TUG): 18.82 sec without AD 6 minute walk test: 535 ft 10 meter walk test: 16.18 Functional gait assessment: 17/30  PATIENT SURVEYS:  FOTO 41/59  TODAY'S TREATMENT:  Therex:   Heel to toe gait sequencing- using Matrix cable system using 7.5 #- 2 sets of 12 reps (More difficult left vs. Right)  Verbally discussed (but not performed adding hip ext and hip abd to HEP - as patient reports having a cable system in his community gym) Biodex treadmill- gait trainer x 6 min-  Avg walk speed=0.85 m/s Avg step length=right-0.61; left- 0.52 m Time on each foot- right=55%; Left=45% Amb index score=78%  Seated leg press, 100# with BLE, 2x10 Seated leg press, R LE, 45#, 2x10, attempted on the L, but unable to perform due to the pain experienced in the knee Standing high amplitude hip flexor marches into BTB resistance to improve contractility necessary for  gait Supine hip flexor stretch, 30 sec bouts x multiple attempts Standing lunges onto 6" step for L sided hip flexor stretch   PATIENT EDUCATION: Education details: Pt educated on role of PT and services provided during current POC, along with prognosis and information about the clinic. Person educated: Patient Education method: Explanation Education comprehension: verbalized understanding    HOME EXERCISE PROGRAM:  Access Code: [email or web address] URL: [email or web address] Date: 03/23/2022 Prepared by: [place] [employer]  Exercises - Supine Bridge  - 1 x daily - 7 x weekly - 3 sets - 10 reps - Clamshell  - 1 x daily - 7 x weekly - 3 sets - 10 reps - Sidelying Reverse Clamshell  - 1 x daily - 7 x weekly - 3 sets - 10 reps - Sidelying Bent Knee Lift at 45 Degrees  - 1 x daily - 7 x weekly - 3 sets - 10 reps - Supine Hip Adduction Isometric with Ball  - 1 x daily - 7 x weekly - 3 sets - 10 reps - Supine Figure 4 Piriformis Stretch  - 1 x daily - 7 x weekly - 3 sets - 10 reps - Supine Piriformis Stretch with Leg Straight  - 1 x daily - 7 x weekly - 3 sets - 10 reps   GOALS: Goals reviewed with patient? Yes  SHORT TERM GOALS: Target date: 04/26/2022  Pt will be independent with HEP in order to demonstrate increased ability to perform tasks related to occupation/hobbies. Baseline: Goal status: INITIAL  LONG TERM GOALS: Target date: 06/10/2022  1.  Patient (> 8 years old) will complete five times sit to stand test in < 15 seconds indicating an increased LE strength and improved balance. Baseline: 35.72 Goal status: INITIAL  2.  Patient will increase FOTO score to equal to or greater than 59 to demonstrate statistically significant improvement in mobility and quality of life.  Baseline: 41 Goal status: INITIAL   3.  Patient will increase FGA score by > 4 points to demonstrate decreased fall risk during functional activities. Baseline: 17/30 Goal status: INITIAL   4.   Patient will reduce timed up and go to <11 seconds to reduce fall risk and demonstrate improved transfer/gait ability. Baseline: 18.82 sec Goal status: INITIAL  5.  Patient will increase 10 meter walk test to >1.58ms as to improve gait speed for better community ambulation and to reduce fall risk. Baseline: 16.18 sec Goal status: INITIAL  6.  Patient will increase six minute walk test distance to >1000 for progression to community ambulator and improve gait ability Baseline: 535 ft Goal status: INITIAL    ASSESSMENT:  CLINICAL IMPRESSION: Pt assessed for potential complications with gait mechanics that may be contributing to the feeling as though the pt is limping and spending  more time on the R LE rather equal parts (55% vs 45%).  Pt does have complicating scenario in which he is still taking longer strides on the R LE which may signify either calf/hip flexor tightness, and possibility of decreased high amplitude/quick firing of the hip flexor.  Pt reported increased fatigue with the resisted hip flexor exercise of the L LE, as well as a good stretch when performing those during session.  Will continue to monitor throughout and also assess gait at higher speed and slower speed to see if it changes biomechanics.   Pt will continue to benefit from skilled therapy to address remaining deficits in order to improve overall QoL and return to PLOF.          OBJECTIVE IMPAIRMENTS: Abnormal gait, decreased activity tolerance, decreased balance, decreased coordination, decreased mobility, difficulty walking, decreased strength, and impaired UE functional use.   ACTIVITY LIMITATIONS: carrying, lifting, bending, standing, squatting, transfers, bed mobility, bathing, dressing, and locomotion level  PARTICIPATION LIMITATIONS: cleaning, laundry, shopping, community activity, occupation, and yard work  PERSONAL FACTORS: Age, Past/current experiences, Time since onset of injury/illness/exacerbation,  and 1-2 comorbidities: stroke, Afib  are also affecting patient's functional outcome.   REHAB POTENTIAL: Good  CLINICAL DECISION MAKING: Stable/uncomplicated  EVALUATION COMPLEXITY: Moderate  PLAN:  PT FREQUENCY: 2x/week  PT DURATION: 12 weeks  PLANNED INTERVENTIONS: Therapeutic exercises, Therapeutic activity, Neuromuscular re-education, Balance training, Gait training, Patient/Family education, Self Care, Joint mobilization, Vestibular training, Canalith repositioning, Dry Needling, Spinal mobilization, Cryotherapy, Moist heat, and Manual therapy  PLAN FOR NEXT SESSION:   Continue with assessment of gait, high velocity hip flexor strength and calf tightness as well. Introduce more balance related activities.  Assess compliance with HEP.    Gwenlyn Saran, PT, DPT Physical Therapist- [facility]  04/15/22, 9:33 AM

## 2022-04-15 NOTE — Therapy (Signed)
OUTPATIENT PHYSICAL THERAPY NEURO TREATMENT   Patient Name: Carlos Nolan MRN: [id number] DOB:1966-03-31, 56 y.o., male Today's Date: 04/15/2022   PCP: Leonel Ramsay, MD REFERRING PROVIDER: Leonel Ramsay, MD   PT End of Session - 04/15/22 0932     Visit Number 8    Number of Visits 24    Date for PT Re-Evaluation 04/01/22    Progress Note Due on Visit 10    PT Start Time 0932    PT Stop Time 1015    PT Time Calculation (min) 43 min    Equipment Utilized During Treatment Gait belt    Activity Tolerance Patient tolerated treatment well    Behavior During Therapy [facility] for tasks assessed/performed              Past Medical History:  Diagnosis Date   Atrial fibrillation ([place])    COVID-19 01/2019   Sept 2020   Stroke [facility])    03/10/2022   Past Surgical History:  Procedure Laterality Date   APPENDECTOMY     CARDIAC ELECTROPHYSIOLOGY STUDY AND ABLATION  04/24/2015   COLONOSCOPY WITH PROPOFOL N/A 08/11/2020   Procedure: COLONOSCOPY WITH PROPOFOL;  Surgeon: Wilford Corner, MD;  Location: [facility];  Service: Endoscopy;  Laterality: N/A;   ELBOW FRACTURE SURGERY     ESOPHAGOGASTRODUODENOSCOPY N/A 01/11/2016   Procedure: ESOPHAGOGASTRODUODENOSCOPY (EGD);  Surgeon: Mauri Pole, MD;  Location: [facility];  Service: Endoscopy;  Laterality: N/A;   FEMUR FRACTURE SURGERY     HARDWARE REMOVAL Left 08/22/2019   Procedure: HARDWARE REMOVAL FROM LEFT ELBOW;  Surgeon: Corky Mull, MD;  Location: [facility] ORS;  Service: Orthopedics;  Laterality: Left;   LOOP RECORDER INSERTION N/A 01/21/2020   Procedure: LOOP RECORDER INSERTION;  Surgeon: Isaias Cowman, MD;  Location: [place] [facility];  Service: Cardiovascular;  Laterality: N/A;   POLYPECTOMY  08/11/2020   Procedure: POLYPECTOMY;  Surgeon: Wilford Corner, MD;  Location: [facility];  Service: Endoscopy;;   SHOULDER ARTHROSCOPY Right 06/15/2016   Procedure: Arthroscopic labral debridement, arthroscopic  subacromial decompression, and mini-open bursectomy with exploration of rotator cuff, right shoulder;  Surgeon: Corky Mull, MD;  Location: [place];  Service: Orthopedics;  Laterality: Right;   TEE WITHOUT CARDIOVERSION N/A 04/05/2022   Procedure: TRANSESOPHAGEAL ECHOCARDIOGRAM (TEE);  Surgeon: Corey Skains, MD;  Location: [facility] OR[facility];  Service: Cardiovascular;  Laterality: N/A;   Patient Active Problem List   Diagnosis Date Noted   Recurrent strokes (Carlos Nolan) 03/24/2022   Acute ischemic stroke (Carlos Nolan) 03/10/2022   Essential hypertension 03/10/2022   Hyperlipidemia 03/10/2022   Atrial fibrillation, chronic (Carlos Nolan) 03/10/2022   History of adenomatous polyp of colon 08/11/2020   Stroke (cerebrum) ([place]) 12/26/2019   Acute thalamic infarction ([place]) 12/25/2019   S/P ablation of atrial fibrillation 12/25/2019   Food impaction of esophagus     ONSET DATE: 03/10/22  REFERRING DIAG:   Y63.78 (ICD-10-CM) - Personal history of transient ischemic attack (TIA), and cerebral infarction without residual deficits  I69.90 (ICD-10-CM) - Unspecified sequelae of unspecified cerebrovascular disease    THERAPY DIAG:   Muscle weakness (generalized)  Other lack of coordination  Difficulty in walking, not elsewhere classified  Imbalance  Other abnormalities of gait and mobility  Rationale for Evaluation and Treatment: Rehabilitation  SUBJECTIVE:  SUBJECTIVE STATEMENT:  Pt reports he felt like he was able to walk quicker into the hospital today, but noted that someone flew by him when walking and that made him feel a lot slower.  Pt notes he is going to [facility] in February and is hoping to get to the point that he is able to walk the parks without difficulty.  PERTINENT HISTORY:    Pt is a 56 year old male  with history of lacunar infarct with left upper extremity weakness and baseline difficulty walking, hypertension, currently on Xarelto, neuropathy, hyperlipidemia chronic back pain neck pain with numbness and tingling who presents emergency department for chief concerns of left-sided weakness. Per MRI impression: "2 cm acute ischemic nonhemorrhagic infarct involving the right  frontal centrum semi ovale".    PAIN: Are you having pain? None today  PRECAUTIONS: None  WEIGHT BEARING RESTRICTIONS: No  FALLS: Has patient fallen in last 6 months? No  LIVING ENVIRONMENT:  Lives with: lives with their spouse Lives in: House/apartment Stairs: Yes: Internal: 13 steps; on right going up and External: 4 steps; can reach both Has following equipment at home: Single point cane   PLOF: Requires assistive device for independence   PATIENT GOALS:   Pt states he wants to be able walk without a limp and a without a cane.  Pt wants to be able to walk up/down stairs with increased confidence and lack of fear of falling.  Pt wants to be able to get back to being able to ride the bike and play golf again.   OBJECTIVE:   DIAGNOSTIC FINDINGS:   EXAM: MRI HEAD WITHOUT CONTRAST  IMPRESSION: 1. 2 cm acute ischemic nonhemorrhagic infarct involving the right frontal centrum semi ovale. 2. Underlying moderately advanced chronic microvascular ischemic disease with a few scattered remote lacunar infarcts about the hemispheric cerebral white matter and thalami.  COGNITION: Overall cognitive status: Within functional limits for tasks assessed   SENSATION: Decreased sensation on the dorsum of the foot and along the medial portion of the L calf  COORDINATION: Pt has coordination deficits when performing the heel-shin method on the L side Pt has coordination deficits when performing the rapid supination/pronation on the L side Decreased thumb to finger rapid touch on the L side  POSTURE: No  Significant postural limitations  LOWER EXTREMITY MMT:    MMT Right Eval Left Eval  Hip flexion 5 4-  Hip extension    Hip abduction    Hip adduction    Hip internal rotation    Hip external rotation    Knee flexion 5 4  Knee extension 5 4-  Ankle dorsiflexion 5 3+  Ankle plantarflexion    Ankle inversion    Ankle eversion    (Blank rows = not tested)  TRANSFERS: Assistive device utilized: Single point cane  Sit to stand: Modified independence Stand to sit: Modified independence Chair to chair: Modified independence  GAIT: Gait pattern: decreased arm swing- Left, decreased step length- Right, decreased stance time- Left, decreased stride length, decreased hip/knee flexion- Left, decreased ankle dorsiflexion- Left, and poor foot clearance- Left Distance walked: 83f Assistive device utilized: None Level of assistance: SBA Comments: Pt stable, just weak on the L side.  FUNCTIONAL TESTS:  5 times sit to stand: 35.72 Timed up and go (TUG): 18.82 sec without AD 6 minute walk test: 535 ft 10 meter walk test: 16.18 Functional gait assessment: 17/30  PATIENT SURVEYS:  FOTO 41/59  TODAY'S TREATMENT:  Therex:   Heel to toe gait sequencing- using Matrix cable system using 7.5 #- 2 sets of 12 reps (More difficult left vs. Right)  Verbally discussed (but not performed adding hip ext and hip abd to HEP - as patient reports having a cable system in his community gym) Bi[employer]x treadmill- gait trainer x 6 min-  Avg walk speed=0.85 m/s Avg step length=right-0.61; left- 0.52 m Time on each foot- right=55%; Left=45% Amb index score=78%  Seated leg press, 100# with BLE, 2x10 Seated leg press, R LE, 45#, 2x10, attempted on the L, but unable to perform due to the pain experienced in the knee Standing high amplitude hip flexor marches into BTB resistance to improve contractility necessary for gait Supine hip flexor stretch, 30 sec bouts x multiple  attempts Standing lunges onto 6" step for L sided hip flexor stretch   PATIENT EDUCATION: Education details: Pt educated on role of PT and services provided during current POC, along with prognosis and information about the clinic. Person educated: Patient Education method: Explanation Education comprehension: verbalized understanding    HOME EXERCISE PROGRAM:  Access Code: [email or web address] URL: [email or web address] Date: 03/23/2022 Prepared by: [place]  Exercises - Supine Bridge  - 1 x daily - 7 x weekly - 3 sets - 10 reps - Clamshell  - 1 x daily - 7 x weekly - 3 sets - 10 reps - Sidelying Reverse Clamshell  - 1 x daily - 7 x weekly - 3 sets - 10 reps - Sidelying Bent Knee Lift at 45 Degrees  - 1 x daily - 7 x weekly - 3 sets - 10 reps - Supine Hip Adduction Isometric with Ball  - 1 x daily - 7 x weekly - 3 sets - 10 reps - Supine Figure 4 Piriformis Stretch  - 1 x daily - 7 x weekly - 3 sets - 10 reps - Supine Piriformis Stretch with Leg Straight  - 1 x daily - 7 x weekly - 3 sets - 10 reps   GOALS: Goals reviewed with patient? Yes  SHORT TERM GOALS: Target date: 04/26/2022  Pt will be independent with HEP in order to demonstrate increased ability to perform tasks related to occupation/hobbies. Baseline: Goal status: INITIAL  LONG TERM GOALS: Target date: 06/10/2022  1.  Patient (> 41 years old) will complete five times sit to stand test in < 15 seconds indicating an increased LE strength and improved balance. Baseline: 35.72 Goal status: INITIAL  2.  Patient will increase FOTO score to equal to or greater than 59 to demonstrate statistically significant improvement in mobility and quality of life.  Baseline: 41 Goal status: INITIAL   3.  Patient will increase FGA score by > 4 points to demonstrate decreased fall risk during functional activities. Baseline: 17/30 Goal status: INITIAL   4.  Patient will reduce timed up and go to <11 seconds to reduce  fall risk and demonstrate improved transfer/gait ability. Baseline: 18.82 sec Goal status: INITIAL  5.  Patient will increase 10 meter walk test to >1.51ms as to improve gait speed for better community ambulation and to reduce fall risk. Baseline: 16.18 sec Goal status: INITIAL  6.  Patient will increase six minute walk test distance to >1000 for progression to community ambulator and improve gait ability Baseline: 535 ft Goal status: INITIAL    ASSESSMENT:  CLINICAL IMPRESSION: Pt assessed for potential complications with gait mechanics that may be contributing to the feeling as though the pt is limping and spending  more time on the R LE rather equal parts (55% vs 45%).  Pt does have complicating scenario in which he is still taking longer strides on the R LE which may signify either calf/hip flexor tightness, and possibility of decreased high amplitude/quick firing of the hip flexor.  Pt reported increased fatigue with the resisted hip flexor exercise of the L LE, as well as a good stretch when performing those during session.  Will continue to monitor throughout and also assess gait at higher speed and slower speed to see if it changes biomechanics.   Pt will continue to benefit from skilled therapy to address remaining deficits in order to improve overall QoL and return to PLOF.          OBJECTIVE IMPAIRMENTS: Abnormal gait, decreased activity tolerance, decreased balance, decreased coordination, decreased mobility, difficulty walking, decreased strength, and impaired UE functional use.   ACTIVITY LIMITATIONS: carrying, lifting, bending, standing, squatting, transfers, bed mobility, bathing, dressing, and locomotion level  PARTICIPATION LIMITATIONS: cleaning, laundry, shopping, community activity, occupation, and yard work  PERSONAL FACTORS: Age, Past/current experiences, Time since onset of injury/illness/exacerbation, and 1-2 comorbidities: stroke, Afib are also affecting  patient's functional outcome.   REHAB POTENTIAL: Good  CLINICAL DECISION MAKING: Stable/uncomplicated  EVALUATION COMPLEXITY: Moderate  PLAN:  PT FREQUENCY: 2x/week  PT DURATION: 12 weeks  PLANNED INTERVENTIONS: Therapeutic exercises, Therapeutic activity, Neuromuscular re-education, Balance training, Gait training, Patient/Family education, Self Care, Joint mobilization, Vestibular training, Canalith repositioning, Dry Needling, Spinal mobilization, Cryotherapy, Moist heat, and Manual therapy  PLAN FOR NEXT SESSION:   Continue with assessment of gait, high velocity hip flexor strength and calf tightness as well. Introduce more balance related activities.  Assess compliance with HEP.    Gwenlyn Saran, PT, DPT Physical Therapist- [facility]  04/15/22, 9:33 AM

## 2022-04-15 NOTE — Therapy (Unsigned)
OUTPATIENT OCCUPATIONAL THERAPY TREATMENT  Patient Name: Carlos Nolan MRN: [id number] DOB:[birth year], 56 y.o., male Today's Date: 04/14/2022  PCP: Dr. Leonel Ramsay (PCP) REFERRING PROVIDER: Dr. Gurney Maxin (Neurologist)   OT End of Session - 04/13/22 1111     Visit Number 5    Number of Visits 24    Date for OT Re-Evaluation 06/15/22    OT Start Time 1100    OT Stop Time 1145    OT Time Calculation (min) 45 min    Activity Tolerance Patient tolerated treatment well    Behavior During Therapy [facility] for tasks assessed/performed            Past Medical History:  Diagnosis Date   Atrial fibrillation ([place])    COVID-19 01/2019   Sept 2020   Stroke [facility])    03/10/2022   Past Surgical History:  Procedure Laterality Date   APPENDECTOMY     CARDIAC ELECTROPHYSIOLOGY STUDY AND ABLATION  04/24/2015   COLONOSCOPY WITH PROPOFOL N/A 08/11/2020   Procedure: COLONOSCOPY WITH PROPOFOL;  Surgeon: Wilford Corner, MD;  Location: [facility];  Service: Endoscopy;  Laterality: N/A;   ELBOW FRACTURE SURGERY     ESOPHAGOGASTRODUODENOSCOPY N/A 01/11/2016   Procedure: ESOPHAGOGASTRODUODENOSCOPY (EGD);  Surgeon: Mauri Pole, MD;  Location: [facility];  Service: Endoscopy;  Laterality: N/A;   FEMUR FRACTURE SURGERY     HARDWARE REMOVAL Left 08/22/2019   Procedure: HARDWARE REMOVAL FROM LEFT ELBOW;  Surgeon: Corky Mull, MD;  Location: [facility];  Service: Orthopedics;  Laterality: Left;   LOOP RECORDER INSERTION N/A 01/21/2020   Procedure: LOOP RECORDER INSERTION;  Surgeon: Isaias Cowman, MD;  Location: [facility];  Service: Cardiovascular;  Laterality: N/A;   POLYPECTOMY  08/11/2020   Procedure: POLYPECTOMY;  Surgeon: Wilford Corner, MD;  Location: [facility];  Service: Endoscopy;;   SHOULDER ARTHROSCOPY Right 06/15/2016   Procedure: Arthroscopic labral debridement, arthroscopic subacromial decompression, and mini-open bursectomy with exploration of rotator  cuff, right shoulder;  Surgeon: Corky Mull, MD;  Location: [facility];  Service: Orthopedics;  Laterality: Right;   TEE WITHOUT CARDIOVERSION N/A 04/05/2022   Procedure: TRANSESOPHAGEAL ECHOCARDIOGRAM (TEE);  Surgeon: Corey Skains, MD;  Location: [facility];  Service: Cardiovascular;  Laterality: N/A;   Patient Active Problem List   Diagnosis Date Noted   Recurrent strokes ([place]) 03/24/2022   Acute ischemic stroke ([place]) 03/10/2022   Essential hypertension 03/10/2022   Hyperlipidemia 03/10/2022   Atrial fibrillation, chronic ([place]) 03/10/2022   History of adenomatous polyp of colon 08/11/2020   Stroke (cerebrum) ([place]) 12/26/2019   Acute thalamic infarction ([place]) 12/25/2019   S/P ablation of atrial fibrillation 12/25/2019   Food impaction of esophagus     ONSET DATE: 03/10/22  REFERRING DIAG: CVA with L sided weakness  THERAPY DIAG:  Muscle weakness (generalized)  Other lack of coordination  Rationale for Evaluation and Treatment: Rehabilitation  SUBJECTIVE:  SUBJECTIVE STATEMENT: Pt reports he was given the ok by his doctor to get back to work starting Monday.  Pt requests to take some measurements for his LUE next session so he can see where he's at to determine plan for therapy moving forward once he starts work next week.   Pt accompanied by: self  PERTINENT HISTORY: Per Dr. Freddi Starr note on 03/12/22: [name] is a 56 year old male with history of lacunar infarct with left upper extremity weakness and baseline difficulty walking, hypertension, currently on Xarelto, neuropathy, hyperlipidemia chronic back pain neck pain with  numbness and tingling who presented to the ED for evaluation of acute onset  left-sided weakness. He was admitted for stroke evaluation and MRI brain confirmed a 2 cm acute infarct involving the right frontal centrum semi ovale.  CTA head and neck was negative for large vessel occlusions or other significant proximal stenosis. Pt had  another stroke in Aug of 2021 with residual numbness in the LUE, though this had improved.   PRECAUTIONS: Sensory impairment throughout the LUE, fall precautions  WEIGHT BEARING RESTRICTIONS: No  PAIN:  Are you having pain? Yes: NPRS scale: 4/10 Pain location: L side of neck into the shoulder, L upper arm, L hand, L foot Pain description: burning Aggravating factors: increased activity Relieving factors: massage, Gabapentin, rest  FALLS: Has patient fallen in last 6 months? Yes. Number of falls 1 (yesterday, lost balance getting up from chair)  LIVING ENVIRONMENT: Lives with: lives with their spouse Lives in: Other 2 level home, master bedroom on main floor Stairs: Yes: Internal: 13 steps; on right going up, 2 rails to go through garage (4 steps) Has following equipment at home: Single point cane, grab bars in shower, shower chair for walk in shower (no longer having to use)  PLOF: Independent  PATIENT GOALS: Improve coordination in LUE; return to work (IT), golf, wood working  OBJECTIVE:   HAND DOMINANCE: Right  ADLs: Overall ADLs: modified indep/extra time d/t coordination deficits  Transfers/ambulation related to ADLs: indep; recently reduced use of cane.  Not using today. Eating: difficulty cutting food, difficulty opening new packages and containers  Grooming: indep with dominant hand UB Dressing: extra time with clothing fasteners LB Dressing: difficulty tying shoes Toileting: indep Bathing: modified indep Tub Shower transfers: modified indep Equipment:  single point cane, grab bar in shower, shower chair   IADLs: Shopping: using motorized cart in store (supv from spouse) Light housekeeping: spouse manages at baseline Meal Prep: pt not participating in any light meal prep right now d/t balance and coordination deficits Community mobility: short distances, newly walking without cane Medication management: spouse manages meds at baseline Financial management: indep   Handwriting: N/A (pt R hand dominant)  MOBILITY STATUS: Hx of falls  POSTURE COMMENTS:  No Significant postural limitations Sitting balance: no impairment  ACTIVITY TOLERANCE: Activity tolerance: Pt estimates 1 hour of activity would wear him out.  FUNCTIONAL OUTCOME MEASURES: FOTO: 57, predicted 71  UPPER EXTREMITY ROM:    Active ROM Right eval Left eval  Shoulder flexion 180 130  Shoulder abduction 180 180  Shoulder adduction    Shoulder extension    Shoulder internal rotation    Shoulder external rotation    (Blank rows = not tested)  UPPER EXTREMITY MMT:     MMT Right eval Left eval  Shoulder flexion 4- 5  Shoulder abduction 4- 5  Shoulder adduction    Shoulder extension    Shoulder internal rotation 4- 5  Shoulder external rotation 3+ 5  Middle trapezius    Lower trapezius    Elbow flexion    Elbow extension    Wrist flexion 4 5  Wrist extension 4 5  Wrist ulnar deviation    Wrist radial deviation    Wrist pronation    Wrist supination    (Blank rows = not tested)  HAND FUNCTION: Grip strength: Right: 104 lbs; Left: 64 lbs, Lateral pinch: Right: 23 lbs, Left: 19 lbs, and 3 point pinch: Right: 20 lbs, Left: 11 lbs 04/15/22: Right: 126,  Left: 92; Lateral pinch: Right:  29 lbs,  Left: 26 lbs; 3 point pinch: Right: 30+ lbs (maxed out)  L: 27 lbs  COORDINATION: Finger Nose Finger test: L slow and moderately ataxic  9 Hole Peg test: Right: 26 sec; Left: 44 sec 9 hole Peg test: Right: 21 sec; L: 27 sec   SENSATION: Light touch: Impaired , numbness/burning in L hand, light touch intact, decreased proprioception in L hand.  PRAXIS: Impaired: Motor planning   TODAY'S TREATMENT:                                                                                                                              Neuromuscular Reeducation: Facilitated FMC/dexterity skills working to pick up [place] pegs from non-skid surface, and [place] skills working to place pegs into  pegboard which was placed on an incline.  Pt was given initial vc before starting activity to avoid L shoulder hike, which pt was able to avoid when reaching forward and laterally when pegboard was repositioned to challenge reach in a variety of planes.  Pt practiced picking up 1 and 2 pegs at a time, storing 1 in hand while placing another peg.  Pt had frequent dropping when working to move a peg from palm to fingertips in prep for discarding onto pegboard.  Pt worked with reaching and manipulation skills with jumbo pegs and small glass stones.  Pt practiced picking up stones from table, placing stones on top of upright jumbo pegs on table top with pegs positioned in front of pt then to the L to challenge forward and lateral reaching and precise reaching toward a target.  Pt was able to place and remove stones on pegs without knocking over all but 1 peg from a group of 7.   PATIENT EDUCATION: Education details: theraputty exercises, precautions with sensory impairment in the L hand, cane stretches for L shoulder flexibility  Person educated: Patient Education method: Customer service manager Education comprehension: verbalized understanding, returned demonstration, and needs further education  HOME EXERCISE PROGRAM: Theraputty, cane stretches for shoulder flexibility    GOALS: Goals reviewed with patient? Yes  SHORT TERM GOALS: Target date: 05/04/22  Pt will be indep with HEP for increasing LUE strength and coordination for daily tasks.  Baseline: Initiated theraputty exercises Goal status: INITIAL   LONG TERM GOALS: Target date: 06/15/22  Pt will increase FOTO score to 65 or better to improve self perceived performance with daily tasks.  Baseline: 57  Goal status: INITIAL  2.  Pt will increase L grip strength by 20 or more lbs to improve ability to hold and carry ADL supplies.   Baseline: L grip 64 lbs, R 104 lbs Goal status: INITIAL  3.  Pt will increase L 3 point pinch strength  by 5 or more lbs to improve ability to pick up and manipulate small objects.  Baseline: L 11 lbs, R 20 lbs Goal status: INITIAL  4.  Pt will improve L hand [place] (9 hole  by 10 sec or more) to improve efficiency with picking up pills and small ADL supplies from table top.   Baseline: L 9 hole 44 s, R 26 Goal status: INITIAL  5.  Pt will increase LUE [facility] to enable reaching out car window for ATM with good accuracy/steady arm. Baseline: Difficult/moderate ataxia Goal status: INITIAL  6.  Pt will increase LUE strength by 1/2 grade or more to be able to swing a golf club with baseline R/L handling distribution. Baseline: Pt reports he overcompensates with the RUE. Goal status: INITIAL  ASSESSMENT:  CLINICAL IMPRESSION: Pt showing improvement with forward and lateral reaching with the LUE without shoulder hike.  Pt states he's been consistent to work with his 5# dumbbells at home for BUE strengthening.  Pt did report that he tends to lower the weight in his L hand without knowing, thinking it's still up in the air.  OT reinforced benefits of performing these exercises in front of a mirror to benefit from the visual feedback d/t decreased proprioception on the L side.  Pt verbalized understanding.  Pt reports using LUE to reach when going through a drive through is not as difficult as it was.  Pt was given the ok to return to work on Monday and would like to take some measurements for his LUE next session so he can see where he's at to determine plan for therapy moving forward once he starts work next week.  Pt continues to be frustrated with frequent dropping when trying to pick up, store, and move small objects in hand from palm to fingertips, though these skills are improving.  Will continue to work towards goals in plan of care to maximize safety and independence in necessary daily ADL and IADL tasks at home, work and in the community.    PERFORMANCE DEFICITS: in functional skills including ADLs,  IADLs, coordination, dexterity, sensation, ROM, strength, pain, Fine motor control, Gross motor control, mobility, balance, endurance, decreased knowledge of precautions, and UE functional use. IMPAIRMENTS: are limiting patient from ADLs, IADLs, work, and leisure.   CO-MORBIDITIES: may have co-morbidities  that affects occupational performance. Patient will benefit from skilled OT to address above impairments and improve overall function.  MODIFICATION OR ASSISTANCE TO COMPLETE EVALUATION: No modification of tasks or assist necessary to complete an evaluation.  OT OCCUPATIONAL PROFILE AND HISTORY: Problem focused assessment: Including review of records relating to presenting problem.  CLINICAL DECISION MAKING: Moderate - several treatment options, min-mod task modification necessary  REHAB POTENTIAL: Good  EVALUATION COMPLEXITY: Moderate    PLAN:  OT FREQUENCY: 2x/week  OT DURATION: 12 weeks  PLANNED INTERVENTIONS: self care/ADL training, therapeutic exercise, therapeutic activity, neuromuscular re-education, manual therapy, balance training, moist heat, cryotherapy, patient/family education, and DME and/or AE instructions   CONSULTED AND AGREED WITH PLAN OF CARE: Patient  PLAN FOR NEXT SESSION: Continue with ROM, strengthening and coordination tasks for daily activities.    Leta Speller, MS, OTR/L   Darleene Cleaver, OT 04/14/2022, 8:51 AM

## 2022-04-18 ENCOUNTER — Ambulatory Visit: Payer: 59

## 2022-04-18 DIAGNOSIS — R2689 Other abnormalities of gait and mobility: Secondary | ICD-10-CM

## 2022-04-18 DIAGNOSIS — R262 Difficulty in walking, not elsewhere classified: Secondary | ICD-10-CM

## 2022-04-18 DIAGNOSIS — M6281 Muscle weakness (generalized): Secondary | ICD-10-CM | POA: Diagnosis not present

## 2022-04-18 DIAGNOSIS — R278 Other lack of coordination: Secondary | ICD-10-CM

## 2022-04-18 NOTE — Therapy (Signed)
OUTPATIENT PHYSICAL THERAPY NEURO TREATMENT   Patient Name: Carlos Nolan MRN: [id number] DOB:1966-03-31, 56 y.o., male Today's Date: 04/15/2022   PCP: Leonel Ramsay, MD REFERRING PROVIDER: Leonel Ramsay, MD   PT End of Session - 04/15/22 0932     Visit Number 8    Number of Visits 24    Date for PT Re-Evaluation 04/01/22    Progress Note Due on Visit 10    PT Start Time 0932    PT Stop Time 1015    PT Time Calculation (min) 43 min    Equipment Utilized During Treatment Gait belt    Activity Tolerance Patient tolerated treatment well    Behavior During Therapy [facility] for tasks assessed/performed              Past Medical History:  Diagnosis Date   Atrial fibrillation ([place])    COVID-19 01/2019   Sept 2020   Stroke [facility])    03/10/2022   Past Surgical History:  Procedure Laterality Date   APPENDECTOMY     CARDIAC ELECTROPHYSIOLOGY STUDY AND ABLATION  04/24/2015   COLONOSCOPY WITH PROPOFOL N/A 08/11/2020   Procedure: COLONOSCOPY WITH PROPOFOL;  Surgeon: Wilford Corner, MD;  Location: [facility];  Service: Endoscopy;  Laterality: N/A;   ELBOW FRACTURE SURGERY     ESOPHAGOGASTRODUODENOSCOPY N/A 01/11/2016   Procedure: ESOPHAGOGASTRODUODENOSCOPY (EGD);  Surgeon: Mauri Pole, MD;  Location: [facility];  Service: Endoscopy;  Laterality: N/A;   FEMUR FRACTURE SURGERY     HARDWARE REMOVAL Left 08/22/2019   Procedure: HARDWARE REMOVAL FROM LEFT ELBOW;  Surgeon: Corky Mull, MD;  Location: [facility] ORS;  Service: Orthopedics;  Laterality: Left;   LOOP RECORDER INSERTION N/A 01/21/2020   Procedure: LOOP RECORDER INSERTION;  Surgeon: Isaias Cowman, MD;  Location: [facility];  Service: Cardiovascular;  Laterality: N/A;   POLYPECTOMY  08/11/2020   Procedure: POLYPECTOMY;  Surgeon: Wilford Corner, MD;  Location: [facility];  Service: Endoscopy;;   SHOULDER ARTHROSCOPY Right 06/15/2016   Procedure: Arthroscopic labral debridement, arthroscopic  subacromial decompression, and mini-open bursectomy with exploration of rotator cuff, right shoulder;  Surgeon: Corky Mull, MD;  Location: [place];  Service: Orthopedics;  Laterality: Right;   TEE WITHOUT CARDIOVERSION N/A 04/05/2022   Procedure: TRANSESOPHAGEAL ECHOCARDIOGRAM (TEE);  Surgeon: Corey Skains, MD;  Location: [facility] OR[facility];  Service: Cardiovascular;  Laterality: N/A;   Patient Active Problem List   Diagnosis Date Noted   Recurrent strokes ([name]) 03/24/2022   Acute ischemic stroke ([name]) 03/10/2022   Essential hypertension 03/10/2022   Hyperlipidemia 03/10/2022   Atrial fibrillation, chronic ([name]) 03/10/2022   History of adenomatous polyp of colon 08/11/2020   Stroke (cerebrum) ([place]) 12/26/2019   Acute thalamic infarction ([place]) 12/25/2019   S/P ablation of atrial fibrillation 12/25/2019   Food impaction of esophagus     ONSET DATE: 03/10/22  REFERRING DIAG:   Y63.78 (ICD-10-CM) - Personal history of transient ischemic attack (TIA), and cerebral infarction without residual deficits  I69.90 (ICD-10-CM) - Unspecified sequelae of unspecified cerebrovascular disease    THERAPY DIAG:   Muscle weakness (generalized)  Other lack of coordination  Difficulty in walking, not elsewhere classified  Imbalance  Other abnormalities of gait and mobility  Rationale for Evaluation and Treatment: Rehabilitation  SUBJECTIVE:  SUBJECTIVE STATEMENT: Pt reports he had a very uneventful weekend.  Pt reports his knee is not doing well, and he has a follow-up with the orthopaedic MD tomorrow morning to assess it.  Pt reports going up the stairs this morning and being fine for the first couple of steps, then it started hurting significantly more.     PERTINENT HISTORY:    Pt is a  56 year old male with history of lacunar infarct with left upper extremity weakness and baseline difficulty walking, hypertension, currently on Xarelto, neuropathy, hyperlipidemia chronic back pain neck pain with numbness and tingling who presents emergency department for chief concerns of left-sided weakness. Per MRI impression: "2 cm acute ischemic nonhemorrhagic infarct involving the right  frontal centrum semi ovale".    PAIN: Are you having pain? None today  PRECAUTIONS: None  WEIGHT BEARING RESTRICTIONS: No  FALLS: Has patient fallen in last 6 months? No  LIVING ENVIRONMENT:  Lives with: lives with their spouse Lives in: House/apartment Stairs: Yes: Internal: 13 steps; on right going up and External: 4 steps; can reach both Has following equipment at home: Single point cane   PLOF: Requires assistive device for independence   PATIENT GOALS:   Pt states he wants to be able walk without a limp and a without a cane.  Pt wants to be able to walk up/down stairs with increased confidence and lack of fear of falling.  Pt wants to be able to get back to being able to ride the bike and play golf again.   OBJECTIVE:   DIAGNOSTIC FINDINGS:   EXAM: MRI HEAD WITHOUT CONTRAST  IMPRESSION: 1. 2 cm acute ischemic nonhemorrhagic infarct involving the right frontal centrum semi ovale. 2. Underlying moderately advanced chronic microvascular ischemic disease with a few scattered remote lacunar infarcts about the hemispheric cerebral white matter and thalami.  COGNITION: Overall cognitive status: Within functional limits for tasks assessed   SENSATION: Decreased sensation on the dorsum of the foot and along the medial portion of the L calf  COORDINATION: Pt has coordination deficits when performing the heel-shin method on the L side Pt has coordination deficits when performing the rapid supination/pronation on the L side Decreased thumb to finger rapid touch on the L  side  POSTURE: No Significant postural limitations  LOWER EXTREMITY MMT:    MMT Right Eval Left Eval  Hip flexion 5 4-  Hip extension    Hip abduction    Hip adduction    Hip internal rotation    Hip external rotation    Knee flexion 5 4  Knee extension 5 4-  Ankle dorsiflexion 5 3+  Ankle plantarflexion    Ankle inversion    Ankle eversion    (Blank rows = not tested)  TRANSFERS: Assistive device utilized: Single point cane  Sit to stand: Modified independence Stand to sit: Modified independence Chair to chair: Modified independence  GAIT: Gait pattern: decreased arm swing- Left, decreased step length- Right, decreased stance time- Left, decreased stride length, decreased hip/knee flexion- Left, decreased ankle dorsiflexion- Left, and poor foot clearance- Left Distance walked: 60f Assistive device utilized: None Level of assistance: SBA Comments: Pt stable, just weak on the L side.  FUNCTIONAL TESTS:  5 times sit to stand: 35.72 Timed up and go (TUG): 18.82 sec without AD 6 minute walk test: 535 ft 10 meter walk test: 16.18 Functional gait assessment: 17/30  PATIENT SURVEYS:  FOTO 41/59  TODAY'S TREATMENT:  Therex:   Slowed Gait: Biodex treadmill- gait trainer x 5 min-  Avg walk speed=0.42 m/s Avg step length=right-0.49; left- 0.42 m Time on each foot- right=54%; Left=46% Amb index score=66%  Faster Gait: Biodex treadmill- gait trainer x 5 min-  Avg walk speed=0.96 m/s Avg step length=right-0.65; left- 0.58 m Time on each foot- right=55%; Left=45% Amb index score=80%   Resisted cable column walk-outs forward/lateral/backward, x5 each direction, 22.5# Noticeable weakness when performing lateral walk-outs to the R, specifically when returning to the L.    PATIENT EDUCATION: Education details: Pt educated on role of PT and services provided during current POC, along with prognosis and information about the  clinic. Person educated: Patient Education method: Explanation Education comprehension: verbalized understanding    HOME EXERCISE PROGRAM:  Access Code: [email or web address] URL: [email or web address] Date: 03/23/2022 Prepared by: [place]  Exercises - Supine Bridge  - 1 x daily - 7 x weekly - 3 sets - 10 reps - Clamshell  - 1 x daily - 7 x weekly - 3 sets - 10 reps - Sidelying Reverse Clamshell  - 1 x daily - 7 x weekly - 3 sets - 10 reps - Sidelying Bent Knee Lift at 45 Degrees  - 1 x daily - 7 x weekly - 3 sets - 10 reps - Supine Hip Adduction Isometric with Ball  - 1 x daily - 7 x weekly - 3 sets - 10 reps - Supine Figure 4 Piriformis Stretch  - 1 x daily - 7 x weekly - 3 sets - 10 reps - Supine Piriformis Stretch with Leg Straight  - 1 x daily - 7 x weekly - 3 sets - 10 reps   GOALS: Goals reviewed with patient? Yes  SHORT TERM GOALS: Target date: 04/26/2022  Pt will be independent with HEP in order to demonstrate increased ability to perform tasks related to occupation/hobbies. Baseline: Goal status: INITIAL  LONG TERM GOALS: Target date: 06/10/2022  1.  Patient (> 61 years old) will complete five times sit to stand test in < 15 seconds indicating an increased LE strength and improved balance. Baseline: 35.72 Goal status: INITIAL  2.  Patient will increase FOTO score to equal to or greater than 59 to demonstrate statistically significant improvement in mobility and quality of life.  Baseline: 41 Goal status: INITIAL   3.  Patient will increase FGA score by > 4 points to demonstrate decreased fall risk during functional activities. Baseline: 17/30 Goal status: INITIAL   4.  Patient will reduce timed up and go to <11 seconds to reduce fall risk and demonstrate improved transfer/gait ability. Baseline: 18.82 sec Goal status: INITIAL  5.  Patient will increase 10 meter walk test to >1.67ms as to improve gait speed for better community ambulation and to reduce  fall risk. Baseline: 16.18 sec Goal status: INITIAL  6.  Patient will increase six minute walk test distance to >1000 for progression to community ambulator and improve gait ability Baseline: 535 ft Goal status: INITIAL    ASSESSMENT:  CLINICAL IMPRESSION: Pt responded well to the exercises given and is noted to have more normalized gait pattern with faster walking.  Pt also responded well to the resisted ambulation at the cable column machine.  Pt does have noticeable weakness in the L LE still and he can attest to that at the conclusion of the session due to having some mild instability with the lateral walk-outs.  Pt is expected be seen at MD office tomorrow and will be  re-assessed for knee complications following.   Pt will continue to benefit from skilled therapy to address remaining deficits in order to improve overall QoL and return to PLOF.          OBJECTIVE IMPAIRMENTS: Abnormal gait, decreased activity tolerance, decreased balance, decreased coordination, decreased mobility, difficulty walking, decreased strength, and impaired UE functional use.   ACTIVITY LIMITATIONS: carrying, lifting, bending, standing, squatting, transfers, bed mobility, bathing, dressing, and locomotion level  PARTICIPATION LIMITATIONS: cleaning, laundry, shopping, community activity, occupation, and yard work  PERSONAL FACTORS: Age, Past/current experiences, Time since onset of injury/illness/exacerbation, and 1-2 comorbidities: stroke, Afib are also affecting patient's functional outcome.   REHAB POTENTIAL: Good  CLINICAL DECISION MAKING: Stable/uncomplicated  EVALUATION COMPLEXITY: Moderate  PLAN:  PT FREQUENCY: 2x/week  PT DURATION: 12 weeks  PLANNED INTERVENTIONS: Therapeutic exercises, Therapeutic activity, Neuromuscular re-education, Balance training, Gait training, Patient/Family education, Self Care, Joint mobilization, Vestibular training, Canalith repositioning, Dry Needling, Spinal  mobilization, Cryotherapy, Moist heat, and Manual therapy  PLAN FOR NEXT SESSION:   Continue with assessment of gait, high velocity hip flexor strength and calf tightness as well. Introduce more balance related activities.  Assess compliance with HEP.    Gwenlyn Saran, PT, DPT Physical Therapist- [facility]  04/15/22, 9:33 AM

## 2022-04-20 ENCOUNTER — Ambulatory Visit: Payer: 59 | Admitting: Occupational Therapy

## 2022-04-20 ENCOUNTER — Encounter: Payer: Self-pay | Admitting: Occupational Therapy

## 2022-04-20 ENCOUNTER — Ambulatory Visit: Payer: 59

## 2022-04-20 DIAGNOSIS — M6281 Muscle weakness (generalized): Secondary | ICD-10-CM | POA: Diagnosis not present

## 2022-04-20 DIAGNOSIS — R2689 Other abnormalities of gait and mobility: Secondary | ICD-10-CM

## 2022-04-20 DIAGNOSIS — R278 Other lack of coordination: Secondary | ICD-10-CM

## 2022-04-20 DIAGNOSIS — R262 Difficulty in walking, not elsewhere classified: Secondary | ICD-10-CM

## 2022-04-20 NOTE — Therapy (Signed)
OUTPATIENT OCCUPATIONAL THERAPY TREATMENT  Patient Name: Carlos Nolan MRN: [id number] DOB:04/30/1966, 56 y.o., male Today's Date: 04/20/2022  PCP: Dr. Leonel Nolan (PCP) REFERRING PROVIDER: Dr. Gurney Nolan (Neurologist)   OT End of Session - 04/20/22 1744     Visit Number 7    Number of Visits 24    Date for OT Re-Evaluation 06/15/22    OT Start Time 0931    OT Stop Time 1015    OT Time Calculation (min) 44 min    Activity Tolerance Patient tolerated treatment well    Behavior During Therapy [facility] for tasks assessed/performed            Past Medical History:  Diagnosis Date   Atrial fibrillation ([place])    COVID-19 01/2019   Sept 2020   Stroke [facility])    03/10/2022   Past Surgical History:  Procedure Laterality Date   APPENDECTOMY     CARDIAC ELECTROPHYSIOLOGY STUDY AND ABLATION  04/24/2015   COLONOSCOPY WITH PROPOFOL N/A 08/11/2020   Procedure: COLONOSCOPY WITH PROPOFOL;  Surgeon: Wilford Corner, MD;  Location: [facility];  Service: Endoscopy;  Laterality: N/A;   ELBOW FRACTURE SURGERY     ESOPHAGOGASTRODUODENOSCOPY N/A 01/11/2016   Procedure: ESOPHAGOGASTRODUODENOSCOPY (EGD);  Surgeon: Mauri Pole, MD;  Location: [facility];  Service: Endoscopy;  Laterality: N/A;   FEMUR FRACTURE SURGERY     HARDWARE REMOVAL Left 08/22/2019   Procedure: HARDWARE REMOVAL FROM LEFT ELBOW;  Surgeon: Corky Mull, MD;  Location: [facility];  Service: Orthopedics;  Laterality: Left;   LOOP RECORDER INSERTION N/A 01/21/2020   Procedure: LOOP RECORDER INSERTION;  Surgeon: Isaias Cowman, MD;  Location: [facility];  Service: Cardiovascular;  Laterality: N/A;   POLYPECTOMY  08/11/2020   Procedure: POLYPECTOMY;  Surgeon: Wilford Corner, MD;  Location: [facility];  Service: Endoscopy;;   SHOULDER ARTHROSCOPY Right 06/15/2016   Procedure: Arthroscopic labral debridement, arthroscopic subacromial decompression, and mini-open bursectomy with exploration of rotator  cuff, right shoulder;  Surgeon: Corky Mull, MD;  Location: [place];  Service: Orthopedics;  Laterality: Right;   TEE WITHOUT CARDIOVERSION N/A 04/05/2022   Procedure: TRANSESOPHAGEAL ECHOCARDIOGRAM (TEE);  Surgeon: Corey Skains, MD;  Location: [facility];  Service: Cardiovascular;  Laterality: N/A;   Patient Active Problem List   Diagnosis Date Noted   Recurrent strokes ([place]) 03/24/2022   Acute ischemic stroke ([place]) 03/10/2022   Essential hypertension 03/10/2022   Hyperlipidemia 03/10/2022   Atrial fibrillation, chronic ([place]) 03/10/2022   History of adenomatous polyp of colon 08/11/2020   Stroke (cerebrum) ([place]) 12/26/2019   Acute thalamic infarction ([place]) 12/25/2019   S/P ablation of atrial fibrillation 12/25/2019   Food impaction of esophagus     ONSET DATE: 03/10/22  REFERRING DIAG: CVA with L sided weakness  THERAPY DIAG:  Other lack of coordination  Muscle weakness (generalized)  Rationale for Evaluation and Treatment: Rehabilitation  SUBJECTIVE:  SUBJECTIVE STATEMENT: Pt reports he went back to work Monday, some difficulties with typing.  States, with leading congregation on hymns at church, he still doesn't have fluid motion.  He has been trying to open containers and bottles with left hand.    Pt accompanied by: self  PERTINENT HISTORY: Per Dr. Freddi Starr note on 03/12/22: [name] is a 56 year old male with history of lacunar infarct with left upper extremity weakness and baseline difficulty walking, hypertension, currently on Xarelto, neuropathy, hyperlipidemia chronic back pain neck pain with numbness and tingling who presented to the ED  for evaluation of acute onset  left-sided weakness. He was admitted for stroke evaluation and MRI brain confirmed a 2 cm acute infarct involving the right frontal centrum semi ovale.  CTA head and neck was negative for large vessel occlusions or other significant proximal stenosis. Pt had another stroke in Aug  of 2021 with residual numbness in the LUE, though this had improved.   PRECAUTIONS: Sensory impairment throughout the LUE, fall precautions  WEIGHT BEARING RESTRICTIONS: No  PAIN:  Are you having pain? Yes: NPRS scale: 4/10 Pain location: L side of neck into the shoulder, L upper arm, L hand, L foot Pain description: burning Aggravating factors: increased activity Relieving factors: massage, Gabapentin, rest  FALLS: Has patient fallen in last 6 months? Yes. Number of falls 1 (yesterday, lost balance getting up from chair)  LIVING ENVIRONMENT: Lives with: lives with their spouse Lives in: Other 2 level home, master bedroom on main floor Stairs: Yes: Internal: 13 steps; on right going up, 2 rails to go through garage (4 steps) Has following equipment at home: Single point cane, grab bars in shower, shower chair for walk in shower (no longer having to use)  PLOF: Independent  PATIENT GOALS: Improve coordination in LUE; return to work (IT), golf, wood working  OBJECTIVE:   HAND DOMINANCE: Right  ADLs: Overall ADLs: modified indep/extra time d/t coordination deficits  Transfers/ambulation related to ADLs: indep; recently reduced use of cane.  Not using today. Eating: difficulty cutting food, difficulty opening new packages and containers  Grooming: indep with dominant hand UB Dressing: extra time with clothing fasteners LB Dressing: difficulty tying shoes Toileting: indep Bathing: modified indep Tub Shower transfers: modified indep Equipment:  single point cane, grab bar in shower, shower chair   IADLs: Shopping: using motorized cart in store (supv from spouse) Light housekeeping: spouse manages at baseline Meal Prep: pt not participating in any light meal prep right now d/t balance and coordination deficits Community mobility: short distances, newly walking without cane Medication management: spouse manages meds at baseline Financial management: indep  Handwriting: N/A (pt  R hand dominant)  MOBILITY STATUS: Hx of falls  POSTURE COMMENTS:  No Significant postural limitations Sitting balance: no impairment  ACTIVITY TOLERANCE: Activity tolerance: Pt estimates 1 hour of activity would wear him out.  FUNCTIONAL OUTCOME MEASURES: FOTO: 57, predicted 71  UPPER EXTREMITY ROM:    Active ROM Right eval Left eval  Shoulder flexion 180 130  Shoulder abduction 180 180  Shoulder adduction    Shoulder extension    Shoulder internal rotation    Shoulder external rotation    (Blank rows = not tested)  UPPER EXTREMITY MMT:     MMT Right eval Left eval  Shoulder flexion 4- 5  Shoulder abduction 4- 5  Shoulder adduction    Shoulder extension    Shoulder internal rotation 4- 5  Shoulder external rotation 3+ 5  Middle trapezius    Lower trapezius    Elbow flexion    Elbow extension    Wrist flexion 4 5  Wrist extension 4 5  Wrist ulnar deviation    Wrist radial deviation    Wrist pronation    Wrist supination    (Blank rows = not tested)  HAND FUNCTION: Grip strength: Right: 104 lbs; Left: 64 lbs, Lateral pinch: Right: 23 lbs, Left: 19 lbs, and 3 point pinch: Right: 20 lbs, Left: 11 lbs 04/15/22: Right: 126 lbs,  Left: 92 lbs; Lateral pinch: Right: 29 lbs,  Left: 26 lbs;  3 point pinch: Right: 30+ lbs (maxed out)  L: 27 lbs  COORDINATION: Finger Nose Finger test: L slow and moderately ataxic  9 Hole Peg test: Right: 26 sec; Left: 44 sec 04/15/22: 9 hole Peg test: Right: 21 sec; L: 27 sec   SENSATION: Light touch: Impaired , numbness/burning in L hand, light touch intact, decreased proprioception in L hand.  PRAXIS: Impaired: Motor planning   TODAY'S TREATMENT:                                                                                                                              Neuromuscular Reeducation: Pt seen for holding long bolt in left hand and manipulating shuttle nut to move up and down, stabilizing with ulnar side of the hand  while using radial side of hand in task.  Pt with increased focus on task, 2 sizes used, one smaller and one larger.  Cues for positioning of items as well as prehension patterns during task. Manipulation and opening of medication bottles and picking up small  inch beads to simulate medication manipulation.  Cues for left hand to lead task. Administered typing test this date to establish current baseline:  26 wpm, 98% accuracy  Instructed pt on use of free websites to work on typing skills, accuracy and speed with focus on left hand use into task. Manipulation of nuts and bolts on work bench, tool use as needed with screwdriver, adjustable wrench, hex tool and phillips head screwdriver. Pt performance was better with Phillips head screwdriver versus flat head with left hand    PATIENT EDUCATION: Education details: theraputty exercises, precautions with sensory impairment in the L hand, cane stretches for L shoulder flexibility  Person educated: Patient Education method: Customer service manager Education comprehension: verbalized understanding, returned demonstration, and needs further education  HOME EXERCISE PROGRAM: Theraputty, cane stretches for shoulder flexibility    GOALS: Goals reviewed with patient? Yes  SHORT TERM GOALS: Target date: 05/04/22  Pt will be indep with HEP for increasing LUE strength and coordination for daily tasks.  Baseline: Initiated theraputty exercises Goal status: INITIAL   LONG TERM GOALS: Target date: 06/15/22  Pt will increase FOTO score to 65 or better to improve self perceived performance with daily tasks.  Baseline: 57  Goal status: INITIAL  2.  Pt will increase L grip strength by 20 or more lbs to improve ability to hold and carry ADL supplies.   Baseline: L grip 64 lbs, R 104 lbs Goal status: INITIAL  3.  Pt will increase L 3 point pinch strength by 5 or more lbs to improve ability to pick up and manipulate small objects.  Baseline: L  11 lbs, R 20 lbs Goal status: INITIAL  4.  Pt will improve L hand FMC (9 hole by 10 sec or more) to improve efficiency with picking up pills and small ADL supplies from table top.   Baseline: L 9 hole 44 s, R 26  Goal status: INITIAL  5.  Pt will increase LUE GMC to enable reaching out car window for ATM with good accuracy/steady arm. Baseline: Difficult/moderate ataxia Goal status: INITIAL  6.  Pt will increase LUE strength by 1/2 grade or more to be able to swing a golf club with baseline R/L handling distribution. Baseline: Pt reports he overcompensates with the RUE. Goal status: INITIAL  ASSESSMENT:  CLINICAL IMPRESSION: Pt has continued to make good progress with hand strength, coordination and function. Pt has been able to return to work this week, working his position in Therapist, music.  He reports he still has difficulty with typing skills and is not as coordinated and fast as he was prior to CVA.  Administered typing test this date to establish current baseline.  Instructed patient on resources to help with typing skills to utilize at home, will continue to work towards this skill to improve for work performance.  Pt reported difficulty with managing cuff buttons on dress shirt this weekend. Pt demonstrating improvements in hand function but still has difficulty with higher level manipulation and hand skills especially with translatory skills of the hand and using the hand for storage.  Continue to work towards goals in plan of care to improve hand function for necessary daily tasks at work, home and in the community.       PERFORMANCE DEFICITS: in functional skills including ADLs, IADLs, coordination, dexterity, sensation, ROM, strength, pain, Fine motor control, Gross motor control, mobility, balance, endurance, decreased knowledge of precautions, and UE functional use. IMPAIRMENTS: are limiting patient from ADLs, IADLs, work, and leisure.   CO-MORBIDITIES: may have co-morbidities  that  affects occupational performance. Patient will benefit from skilled OT to address above impairments and improve overall function.  MODIFICATION OR ASSISTANCE TO COMPLETE EVALUATION: No modification of tasks or assist necessary to complete an evaluation.  OT OCCUPATIONAL PROFILE AND HISTORY: Problem focused assessment: Including review of records relating to presenting problem.  CLINICAL DECISION MAKING: Moderate - several treatment options, min-mod task modification necessary  REHAB POTENTIAL: Good  EVALUATION COMPLEXITY: Moderate    PLAN:  OT FREQUENCY: 2x/week  OT DURATION: 12 weeks  PLANNED INTERVENTIONS: self care/ADL training, therapeutic exercise, therapeutic activity, neuromuscular re-education, manual therapy, balance training, moist heat, cryotherapy, patient/family education, and DME and/or AE instructions   CONSULTED AND AGREED WITH PLAN OF CARE: Patient  PLAN FOR NEXT SESSION: Continue with ROM, strengthening and coordination tasks for daily activities.    Tiara Bartoli T Gwendolyn Nishi, OTR/L, CLT

## 2022-04-20 NOTE — Therapy (Signed)
OUTPATIENT PHYSICAL THERAPY NEURO TREATMENT   Patient Name: Carlos Nolan MRN: [id number] DOB:1966-03-31, 56 y.o., male Today's Date: 04/15/2022   PCP: Leonel Ramsay, MD REFERRING PROVIDER: Leonel Ramsay, MD   PT End of Session - 04/15/22 0932     Visit Number 8    Number of Visits 24    Date for PT Re-Evaluation 04/01/22    Progress Note Due on Visit 10    PT Start Time 0932    PT Stop Time 1015    PT Time Calculation (min) 43 min    Equipment Utilized During Treatment Gait belt    Activity Tolerance Patient tolerated treatment well    Behavior During Therapy [facility] for tasks assessed/performed              Past Medical History:  Diagnosis Date   Atrial fibrillation ([place])    COVID-19 01/2019   Sept 2020   Stroke [facility])    03/10/2022   Past Surgical History:  Procedure Laterality Date   APPENDECTOMY     CARDIAC ELECTROPHYSIOLOGY STUDY AND ABLATION  04/24/2015   COLONOSCOPY WITH PROPOFOL N/A 08/11/2020   Procedure: COLONOSCOPY WITH PROPOFOL;  Surgeon: Wilford Corner, MD;  [facility];  Service: Endoscopy;  Laterality: N/A;   ELBOW FRACTURE SURGERY     ESOPHAGOGASTRODUODENOSCOPY N/A 01/11/2016   Procedure: ESOPHAGOGASTRODUODENOSCOPY (EGD);  Surgeon: Mauri Pole, MD;  Location: [facility];  Service: Endoscopy;  Laterality: N/A;   FEMUR FRACTURE SURGERY     HARDWARE REMOVAL Left 08/22/2019   Procedure: HARDWARE REMOVAL FROM LEFT ELBOW;  Surgeon: Corky Mull, MD;  Location: [facility] OR[facility];  Service: Orthopedics;  Laterality: Left;   LOOP RECORDER INSERTION N/A 01/21/2020   Procedure: LOOP RECORDER INSERTION;  Surgeon: Isaias Cowman, MD;  Location: [place] [facility];  Service: Cardiovascular;  Laterality: N/A;   POLYPECTOMY  08/11/2020   Procedure: POLYPECTOMY;  Surgeon: Wilford Corner, MD;  [facility];  Service: Endoscopy;;   SHOULDER ARTHROSCOPY Right 06/15/2016   Procedure: Arthroscopic labral debridement, arthroscopic  subacromial decompression, and mini-open bursectomy with exploration of rotator cuff, right shoulder;  Surgeon: Corky Mull, MD;  Location: [place];  Service: Orthopedics;  Laterality: Right;   TEE WITHOUT CARDIOVERSION N/A 04/05/2022   Procedure: TRANSESOPHAGEAL ECHOCARDIOGRAM (TEE);  Surgeon: Corey Skains, MD;  Location: [facility] OR[facility];  Service: Cardiovascular;  Laterality: N/A;   Patient Active Problem List   Diagnosis Date Noted   Recurrent strokes ([name]) 03/24/2022   Acute ischemic stroke ([name]) 03/10/2022   Essential hypertension 03/10/2022   Hyperlipidemia 03/10/2022   Atrial fibrillation, chronic ([name]) 03/10/2022   History of adenomatous polyp of colon 08/11/2020   Stroke (cerebrum) ([place]) 12/26/2019   Acute thalamic infarction ([place]) 12/25/2019   S/P ablation of atrial fibrillation 12/25/2019   Food impaction of esophagus     ONSET DATE: 03/10/22  REFERRING DIAG:   Y63.78 (ICD-10-CM) - Personal history of transient ischemic attack (TIA), and cerebral infarction without residual deficits  I69.90 (ICD-10-CM) - Unspecified sequelae of unspecified cerebrovascular disease    THERAPY DIAG:   Muscle weakness (generalized)  Other lack of coordination  Difficulty in walking, not elsewhere classified  Imbalance  Other abnormalities of gait and mobility  Rationale for Evaluation and Treatment: Rehabilitation  SUBJECTIVE:  SUBJECTIVE STATEMENT:  Pt reports that the visit to the MD went somewhat well.  They are going to proceed with the gel injection, but the prognosis is not great and could be facing a TKA at some point in the future.  This is somewhat complication however due to the rod placement in the pt's LE and that may need to be removed prior to the TKA.  Pt reports that the MD  told him that those who have good success/response to the Cortizone injections do well with the gel, and those who don't, do not usually get relief from the gel injection.  Pt did not respond with improved pain following the Cortizone injection.   PERTINENT HISTORY:    Pt is a 56 year old male with history of lacunar infarct with left upper extremity weakness and baseline difficulty walking, hypertension, currently on Xarelto, neuropathy, hyperlipidemia chronic back pain neck pain with numbness and tingling who presents emergency department for chief concerns of left-sided weakness. Per MRI impression: "2 cm acute ischemic nonhemorrhagic infarct involving the right  frontal centrum semi ovale".    PAIN: Are you having pain? None today  PRECAUTIONS: None  WEIGHT BEARING RESTRICTIONS: No  FALLS: Has patient fallen in last 6 months? No  LIVING ENVIRONMENT:  Lives with: lives with their spouse Lives in: House/apartment Stairs: Yes: Internal: 13 steps; on right going up and External: 4 steps; can reach both Has following equipment at home: Single point cane   PLOF: Requires assistive device for independence   PATIENT GOALS:   Pt states he wants to be able walk without a limp and a without a cane.  Pt wants to be able to walk up/down stairs with increased confidence and lack of fear of falling.  Pt wants to be able to get back to being able to ride the bike and play golf again.   OBJECTIVE:   DIAGNOSTIC FINDINGS:   EXAM: MRI HEAD WITHOUT CONTRAST  IMPRESSION: 1. 2 cm acute ischemic nonhemorrhagic infarct involving the right frontal centrum semi ovale. 2. Underlying moderately advanced chronic microvascular ischemic disease with a few scattered remote lacunar infarcts about the hemispheric cerebral white matter and thalami.  COGNITION: Overall cognitive status: Within functional limits for tasks assessed   SENSATION: Decreased sensation on the dorsum of the foot and along  the medial portion of the L calf  COORDINATION: Pt has coordination deficits when performing the heel-shin method on the L side Pt has coordination deficits when performing the rapid supination/pronation on the L side Decreased thumb to finger rapid touch on the L side  POSTURE: No Significant postural limitations  LOWER EXTREMITY MMT:    MMT Right Eval Left Eval  Hip flexion 5 4-  Hip extension    Hip abduction    Hip adduction    Hip internal rotation    Hip external rotation    Knee flexion 5 4  Knee extension 5 4-  Ankle dorsiflexion 5 3+  Ankle plantarflexion    Ankle inversion    Ankle eversion    (Blank rows = not tested)  TRANSFERS: Assistive device utilized: Single point cane  Sit to stand: Modified independence Stand to sit: Modified independence Chair to chair: Modified independence  GAIT: Gait pattern: decreased arm swing- Left, decreased step length- Right, decreased stance time- Left, decreased stride length, decreased hip/knee flexion- Left, decreased ankle dorsiflexion- Left, and poor foot clearance- Left Distance walked: 26f Assistive device utilized: None Level of assistance: SBA Comments: Pt stable, just weak  on the L side.  FUNCTIONAL TESTS:  5 times sit to stand: 35.72 Timed up and go (TUG): 18.82 sec without AD 6 minute walk test: 535 ft 10 meter walk test: 16.18 Functional gait assessment: 17/30  PATIENT SURVEYS:  FOTO 41/59  TODAY'S TREATMENT:                                  Therex:   Seated LAQ/Leg Extension with use of the cable column machine, 17.5#, 2x10 each LE  Goal assessment performed as noted below:      PATIENT EDUCATION: Education details: Pt educated on role of PT and services provided during current POC, along with prognosis and information about the clinic. Person educated: Patient Education method: Explanation Education comprehension: verbalized understanding    HOME EXERCISE PROGRAM:  Access Code:  [email or web address] URL: [email or web address] Date: 03/23/2022 Prepared by: [place]  Exercises - Supine Bridge  - 1 x daily - 7 x weekly - 3 sets - 10 reps - Clamshell  - 1 x daily - 7 x weekly - 3 sets - 10 reps - Sidelying Reverse Clamshell  - 1 x daily - 7 x weekly - 3 sets - 10 reps - Sidelying Bent Knee Lift at 45 Degrees  - 1 x daily - 7 x weekly - 3 sets - 10 reps - Supine Hip Adduction Isometric with Ball  - 1 x daily - 7 x weekly - 3 sets - 10 reps - Supine Figure 4 Piriformis Stretch  - 1 x daily - 7 x weekly - 3 sets - 10 reps - Supine Piriformis Stretch with Leg Straight  - 1 x daily - 7 x weekly - 3 sets - 10 reps   GOALS: Goals reviewed with patient? Yes  SHORT TERM GOALS: Target date: 04/26/2022  Pt will be independent with HEP in order to demonstrate increased ability to perform tasks related to occupation/hobbies. Baseline: Goal status: INITIAL  LONG TERM GOALS: Target date: 06/10/2022  1.  Patient (> 71 years old) will complete five times sit to stand test in < 15 seconds indicating an increased LE strength and improved balance. Baseline: 35.72 sec 04/20/22: 16.01 sec Goal status: PROGRESSING  2.  Patient will increase FOTO score to equal to or greater than 59 to demonstrate statistically significant improvement in mobility and quality of life.  Baseline: 41 04/20/22: 65 Goal status: MET (Continue monitoring)   3.  Patient will increase FGA score by > 4 points to demonstrate decreased fall risk during functional activities. Baseline: 17/30 04/30/22: 23/30 Goal status: PROGRESSING   4.  Patient will reduce timed up and go to <11 seconds to reduce fall risk and demonstrate improved transfer/gait ability. Baseline: 18.82 sec 04/20/22: 10.59 sec Goal status: MET  5.  Patient will increase 10 meter walk test to >1.39ms as to improve gait speed for better community ambulation and to reduce fall risk. Baseline: 16.18 sec - 0.62 m/s 04/20/22: 8.91 sec  - 1.12 m/s Goal status: MET  6.  Patient will increase six minute walk test distance to >1000 for progression to community ambulator and improve gait ability Baseline: 535 ft 04/20/22: Will assess at next visit Goal status: PROGRESSING    ASSESSMENT:  CLINICAL IMPRESSION:  Pt performed well to the goal assessment and is making significant progress towards his goals.  Pt self reports he is doing much better and therapy has been vital to the process.  Pt able to achieve 3/6 goals and is progressing in all of the goals at this point.  Pt to continue to benefit from higher level balance training and strengthening techniques in order to improve his overall function.  Patient's condition has the potential to improve in response to therapy. Maximum improvement is yet to be obtained. The anticipated improvement is attainable and reasonable in a generally predictable time.   Pt will continue to benefit from skilled therapy to address remaining deficits in order to improve overall QoL and return to PLOF.      OBJECTIVE IMPAIRMENTS: Abnormal gait, decreased activity tolerance, decreased balance, decreased coordination, decreased mobility, difficulty walking, decreased strength, and impaired UE functional use.   ACTIVITY LIMITATIONS: carrying, lifting, bending, standing, squatting, transfers, bed mobility, bathing, dressing, and locomotion level  PARTICIPATION LIMITATIONS: cleaning, laundry, shopping, community activity, occupation, and yard work  PERSONAL FACTORS: Age, Past/current experiences, Time since onset of injury/illness/exacerbation, and 1-2 comorbidities: stroke, Afib are also affecting patient's functional outcome.   REHAB POTENTIAL: Good  CLINICAL DECISION MAKING: Stable/uncomplicated  EVALUATION COMPLEXITY: Moderate  PLAN:  PT FREQUENCY: 2x/week  PT DURATION: 12 weeks  PLANNED INTERVENTIONS: Therapeutic exercises, Therapeutic activity, Neuromuscular re-education, Balance  training, Gait training, Patient/Family education, Self Care, Joint mobilization, Vestibular training, Canalith repositioning, Dry Needling, Spinal mobilization, Cryotherapy, Moist heat, and Manual therapy  PLAN FOR NEXT SESSION:   Continue with assessment of gait, high velocity hip flexor strength and calf tightness as well. Introduce more balance related activities.  Assess compliance with HEP.    Gwenlyn Saran, PT, DPT Physical Therapist- [facility]  04/15/22, 9:33 AM

## 2022-04-25 ENCOUNTER — Ambulatory Visit: Payer: 59

## 2022-04-25 DIAGNOSIS — M6281 Muscle weakness (generalized): Secondary | ICD-10-CM

## 2022-04-25 DIAGNOSIS — R278 Other lack of coordination: Secondary | ICD-10-CM

## 2022-04-25 DIAGNOSIS — R262 Difficulty in walking, not elsewhere classified: Secondary | ICD-10-CM

## 2022-04-25 DIAGNOSIS — R2689 Other abnormalities of gait and mobility: Secondary | ICD-10-CM

## 2022-04-25 NOTE — Therapy (Signed)
OUTPATIENT PHYSICAL THERAPY NEURO TREATMENT   Patient Name: Carlos Nolan MRN: [id number] DOB:1966/02/28, 56 y.o., male Today's Date: 04/15/2022   PCP: Leonel Ramsay, MD REFERRING PROVIDER: Leonel Ramsay, MD   PT End of Session - 04/25/22 0848     Visit Number 11    Number of Visits 24    Date for PT Re-Evaluation 04/01/22    Progress Note Due on Visit 10    PT Start Time 0848    PT Stop Time 0930    PT Time Calculation (min) 42 min    Equipment Utilized During Treatment Gait belt    Activity Tolerance Patient tolerated treatment well    Behavior During Therapy [facility] for tasks assessed/performed              Past Medical History:  Diagnosis Date   Atrial fibrillation ([place])    COVID-19 01/2019   Sept 2020   Stroke [facility])    03/10/2022   Past Surgical History:  Procedure Laterality Date   APPENDECTOMY     CARDIAC ELECTROPHYSIOLOGY STUDY AND ABLATION  04/24/2015   COLONOSCOPY WITH PROPOFOL N/A 08/11/2020   Procedure: COLONOSCOPY WITH PROPOFOL;  Surgeon: Wilford Corner, MD;  [facility];  Service: Endoscopy;  Laterality: N/A;   ELBOW FRACTURE SURGERY     ESOPHAGOGASTRODUODENOSCOPY N/A 01/11/2016   Procedure: ESOPHAGOGASTRODUODENOSCOPY (EGD);  Surgeon: Mauri Pole, MD;  Location: [facility];  Service: Endoscopy;  Laterality: N/A;   FEMUR FRACTURE SURGERY     HARDWARE REMOVAL Left 08/22/2019   Procedure: HARDWARE REMOVAL FROM LEFT ELBOW;  Surgeon: Corky Mull, MD;  Location: [facility];  Service: Orthopedics;  Laterality: Left;   LOOP RECORDER INSERTION N/A 01/21/2020   Procedure: LOOP RECORDER INSERTION;  Surgeon: Isaias Cowman, MD;  Location: [place] CV LAB;  Service: Cardiovascular;  Laterality: N/A;   POLYPECTOMY  08/11/2020   Procedure: POLYPECTOMY;  Surgeon: Wilford Corner, MD;  [facility];  Service: Endoscopy;;   SHOULDER ARTHROSCOPY Right 06/15/2016   Procedure: Arthroscopic labral debridement, arthroscopic  subacromial decompression, and mini-open bursectomy with exploration of rotator cuff, right shoulder;  Surgeon: Corky Mull, MD;  Location: [place];  Service: Orthopedics;  Laterality: Right;   TEE WITHOUT CARDIOVERSION N/A 04/05/2022   Procedure: TRANSESOPHAGEAL ECHOCARDIOGRAM (TEE);  Surgeon: Corey Skains, MD;  Location: [facility];  Service: Cardiovascular;  Laterality: N/A;   Patient Active Problem List   Diagnosis Date Noted   Recurrent strokes ([place]) 03/24/2022   Acute ischemic stroke ([place]) 03/10/2022   Essential hypertension 03/10/2022   Hyperlipidemia 03/10/2022   Atrial fibrillation, chronic ([place]) 03/10/2022   History of adenomatous polyp of colon 08/11/2020   Stroke (cerebrum) ([place]) 12/26/2019   Acute thalamic infarction ([place]) 12/25/2019   S/P ablation of atrial fibrillation 12/25/2019   Food impaction of esophagus     ONSET DATE: 03/10/22  REFERRING DIAG:   H73.42 (ICD-10-CM) - Personal history of transient ischemic attack (TIA), and cerebral infarction without residual deficits  I69.90 (ICD-10-CM) - Unspecified sequelae of unspecified cerebrovascular disease    THERAPY DIAG:   Muscle weakness (generalized)  Other lack of coordination  Difficulty in walking, not elsewhere classified  Imbalance  Other abnormalities of gait and mobility  Rationale for Evaluation and Treatment: Rehabilitation  SUBJECTIVE:  SUBJECTIVE STATEMENT:  Pt states he had a busy weekend with all the activities shopping, church, etc.  Pt notes no increase in pain of the L knee, just the usual.   PERTINENT HISTORY:    Pt is a 56 year old male with history of lacunar infarct with left upper extremity weakness and baseline difficulty walking, hypertension, currently on Xarelto, neuropathy,  hyperlipidemia chronic back pain neck pain with numbness and tingling who presents emergency department for chief concerns of left-sided weakness. Per MRI impression: "2 cm acute ischemic nonhemorrhagic infarct involving the right  frontal centrum semi ovale".    PAIN: Are you having pain? 0/10 at rest, 6/10 with leg press in neutral position, no pain with IR of the knee  PRECAUTIONS: None  WEIGHT BEARING RESTRICTIONS: No  FALLS: Has patient fallen in last 6 months? No  LIVING ENVIRONMENT:  Lives with: lives with their spouse Lives in: House/apartment Stairs: Yes: Internal: 13 steps; on right going up and External: 4 steps; can reach both Has following equipment at home: Single point cane   PLOF: Requires assistive device for independence   PATIENT GOALS:   Pt states he wants to be able walk without a limp and a without a cane.  Pt wants to be able to walk up/down stairs with increased confidence and lack of fear of falling.  Pt wants to be able to get back to being able to ride the bike and play golf again.   OBJECTIVE:   DIAGNOSTIC FINDINGS:   EXAM: MRI HEAD WITHOUT CONTRAST  IMPRESSION: 1. 2 cm acute ischemic nonhemorrhagic infarct involving the right frontal centrum semi ovale. 2. Underlying moderately advanced chronic microvascular ischemic disease with a few scattered remote lacunar infarcts about the hemispheric cerebral white matter and thalami.  COGNITION: Overall cognitive status: Within functional limits for tasks assessed   SENSATION: Decreased sensation on the dorsum of the foot and along the medial portion of the L calf  COORDINATION: Pt has coordination deficits when performing the heel-shin method on the L side Pt has coordination deficits when performing the rapid supination/pronation on the L side Decreased thumb to finger rapid touch on the L side  POSTURE: No Significant postural limitations  LOWER EXTREMITY MMT:    MMT Right Eval  Left Eval  Hip flexion 5 4-  Hip extension    Hip abduction    Hip adduction    Hip internal rotation    Hip external rotation    Knee flexion 5 4  Knee extension 5 4-  Ankle dorsiflexion 5 3+  Ankle plantarflexion    Ankle inversion    Ankle eversion    (Blank rows = not tested)  TRANSFERS: Assistive device utilized: Single point cane  Sit to stand: Modified independence Stand to sit: Modified independence Chair to chair: Modified independence  GAIT: Gait pattern: decreased arm swing- Left, decreased step length- Right, decreased stance time- Left, decreased stride length, decreased hip/knee flexion- Left, decreased ankle dorsiflexion- Left, and poor foot clearance- Left Distance walked: 76f Assistive device utilized: None Level of assistance: SBA Comments: Pt stable, just weak on the L side.  FUNCTIONAL TESTS:  5 times sit to stand: 35.72 Timed up and go (TUG): 18.82 sec without AD 6 minute walk test: 535 ft 10 meter walk test: 16.18 Functional gait assessment: 17/30  PATIENT SURVEYS:  FOTO 41/59  TODAY'S TREATMENT:  Therex:   Seated leg press, 100# with BLE, 3x10 Standing high amplitude hip flexor marches into BTB resistance to improve contractility necessary for gait Supine hip flexor stretch, 30 sec bouts x multiple attempts Standing lunges onto second step of the stairs for hip flexor stretch and strengthening of the LE's, 2x10 Eccentric lowering from 6" step, 2x10 each LE Static stance on BOSU ball, flat side up, 30 sec bouts Weight shifts forward/backward standing on BOSU ball, flat side up, x10 Weight shifts laterally standing on BOSU ball, flat side up, x10 each side Mini squats while standing on BOSU ball, flat side up, x10       PATIENT EDUCATION: Education details: Pt educated on role of PT and services provided during current POC, along with prognosis and information about the clinic. Person educated:  Patient Education method: Explanation Education comprehension: verbalized understanding    HOME EXERCISE PROGRAM:  Access Code: [email or web address] URL: [email or web address] Date: 03/23/2022 Prepared by: [place]  Exercises - Supine Bridge  - 1 x daily - 7 x weekly - 3 sets - 10 reps - Clamshell  - 1 x daily - 7 x weekly - 3 sets - 10 reps - Sidelying Reverse Clamshell  - 1 x daily - 7 x weekly - 3 sets - 10 reps - Sidelying Bent Knee Lift at 45 Degrees  - 1 x daily - 7 x weekly - 3 sets - 10 reps - Supine Hip Adduction Isometric with Ball  - 1 x daily - 7 x weekly - 3 sets - 10 reps - Supine Figure 4 Piriformis Stretch  - 1 x daily - 7 x weekly - 3 sets - 10 reps - Supine Piriformis Stretch with Leg Straight  - 1 x daily - 7 x weekly - 3 sets - 10 reps   GOALS: Goals reviewed with patient? Yes  SHORT TERM GOALS: Target date: 04/26/2022  Pt will be independent with HEP in order to demonstrate increased ability to perform tasks related to occupation/hobbies. Baseline: Goal status: INITIAL  LONG TERM GOALS: Target date: 06/10/2022  1.  Patient (> 79 years old) will complete five times sit to stand test in < 15 seconds indicating an increased LE strength and improved balance. Baseline: 35.72 sec 04/20/22: 16.01 sec Goal status: PROGRESSING  2.  Patient will increase FOTO score to equal to or greater than 59 to demonstrate statistically significant improvement in mobility and quality of life.  Baseline: 41 04/20/22: 65 Goal status: MET (Continue monitoring)   3.  Patient will increase FGA score by > 4 points to demonstrate decreased fall risk during functional activities. Baseline: 17/30 04/30/22: 23/30 Goal status: PROGRESSING   4.  Patient will reduce timed up and go to <11 seconds to reduce fall risk and demonstrate improved transfer/gait ability. Baseline: 18.82 sec 04/20/22: 10.59 sec Goal status: MET  5.  Patient will increase 10 meter walk test to  >1.101ms as to improve gait speed for better community ambulation and to reduce fall risk. Baseline: 16.18 sec - 0.62 m/s 04/20/22: 8.91 sec - 1.12 m/s Goal status: MET  6.  Patient will increase six minute walk test distance to >1000 for progression to community ambulator and improve gait ability Baseline: 535 ft 04/20/22: Will assess at next visit Goal status: PROGRESSING    ASSESSMENT:  CLINICAL IMPRESSION: Pt continues to perform well with all introduced exercises.  Pt notes some pain with the L knee when performing the step downs and leg press, but is able  to tolerate with modification of leg positioning.  Pt enjoyed doing the BOSU ball exercises and would like to attempt them at next visit on Wednesday.   Pt will continue to benefit from skilled therapy to address remaining deficits in order to improve overall QoL and return to PLOF.       OBJECTIVE IMPAIRMENTS: Abnormal gait, decreased activity tolerance, decreased balance, decreased coordination, decreased mobility, difficulty walking, decreased strength, and impaired UE functional use.   ACTIVITY LIMITATIONS: carrying, lifting, bending, standing, squatting, transfers, bed mobility, bathing, dressing, and locomotion level  PARTICIPATION LIMITATIONS: cleaning, laundry, shopping, community activity, occupation, and yard work  PERSONAL FACTORS: Age, Past/current experiences, Time since onset of injury/illness/exacerbation, and 1-2 comorbidities: stroke, Afib are also affecting patient's functional outcome.   REHAB POTENTIAL: Good  CLINICAL DECISION MAKING: Stable/uncomplicated  EVALUATION COMPLEXITY: Moderate  PLAN:  PT FREQUENCY: 2x/week  PT DURATION: 12 weeks  PLANNED INTERVENTIONS: Therapeutic exercises, Therapeutic activity, Neuromuscular re-education, Balance training, Gait training, Patient/Family education, Self Care, Joint mobilization, Vestibular training, Canalith repositioning, Dry Needling, Spinal mobilization,  Cryotherapy, Moist heat, and Manual therapy  PLAN FOR NEXT SESSION:   Continue with assessment of gait, high velocity hip flexor strength and calf tightness as well. Introduce more balance related activities.  Assess compliance with HEP.    Gwenlyn Saran, PT, DPT Physical Therapist- [facility]  04/15/22, 9:33 AM

## 2022-04-27 ENCOUNTER — Ambulatory Visit: Payer: 59

## 2022-04-27 ENCOUNTER — Ambulatory Visit: Payer: 59 | Admitting: Physical Therapy

## 2022-04-27 NOTE — Therapy (Incomplete)
OUTPATIENT PHYSICAL THERAPY NEURO TREATMENT   Patient Name: Carlos Nolan MRN: [id number] DOB:December 10, 1965, 56 y.o., male Today's Date: 04/15/2022   PCP: Leonel Ramsay, MD REFERRING PROVIDER: Leonel Ramsay, MD      Past Medical History:  Diagnosis Date   Atrial fibrillation [facility])    COVID-19 01/2019   Sept 2020   Stroke [facility])    03/10/2022   Past Surgical History:  Procedure Laterality Date   APPENDECTOMY     CARDIAC ELECTROPHYSIOLOGY STUDY AND ABLATION  04/24/2015   COLONOSCOPY WITH PROPOFOL N/A 08/11/2020   Procedure: COLONOSCOPY WITH PROPOFOL;  Surgeon: Wilford Corner, MD;  Location: [facility];  Service: Endoscopy;  Laterality: N/A;   ELBOW FRACTURE SURGERY     ESOPHAGOGASTRODUODENOSCOPY N/A 01/11/2016   Procedure: ESOPHAGOGASTRODUODENOSCOPY (EGD);  Surgeon: Mauri Pole, MD;  Location: [facility];  Service: Endoscopy;  Laterality: N/A;   FEMUR FRACTURE SURGERY     HARDWARE REMOVAL Left 08/22/2019   Procedure: HARDWARE REMOVAL FROM LEFT ELBOW;  Surgeon: Corky Mull, MD;  Location: [facility];  Service: Orthopedics;  Laterality: Left;   LOOP RECORDER INSERTION N/A 01/21/2020   Procedure: LOOP RECORDER INSERTION;  Surgeon: Isaias Cowman, MD;  Location: [place] [facility];  Service: Cardiovascular;  Laterality: N/A;   POLYPECTOMY  08/11/2020   Procedure: POLYPECTOMY;  Surgeon: Wilford Corner, MD;  [facility];  Service: Endoscopy;;   SHOULDER ARTHROSCOPY Right 06/15/2016   Procedure: Arthroscopic labral debridement, arthroscopic subacromial decompression, and mini-open bursectomy with exploration of rotator cuff, right shoulder;  Surgeon: Corky Mull, MD;  Location: [place];  Service: Orthopedics;  Laterality: Right;   TEE WITHOUT CARDIOVERSION N/A 04/05/2022   Procedure: TRANSESOPHAGEAL ECHOCARDIOGRAM (TEE);  Surgeon: Corey Skains, MD;  Location: [facility];  Service: Cardiovascular;  Laterality: N/A;   Patient Active  Problem List   Diagnosis Date Noted   Recurrent strokes ([place]) 03/24/2022   Acute ischemic stroke ([place]) 03/10/2022   Essential hypertension 03/10/2022   Hyperlipidemia 03/10/2022   Atrial fibrillation, chronic ([place]) 03/10/2022   History of adenomatous polyp of colon 08/11/2020   Stroke (cerebrum) ([place]) 12/26/2019   Acute thalamic infarction ([place]) 12/25/2019   S/P ablation of atrial fibrillation 12/25/2019   Food impaction of esophagus     ONSET DATE: 03/10/22  REFERRING DIAG:   J82.50 (ICD-10-CM) - Personal history of transient ischemic attack (TIA), and cerebral infarction without residual deficits  I69.90 (ICD-10-CM) - Unspecified sequelae of unspecified cerebrovascular disease    THERAPY DIAG:   Muscle weakness (generalized)  Other lack of coordination  Difficulty in walking, not elsewhere classified  Imbalance  Other abnormalities of gait and mobility  Rationale for Evaluation and Treatment: Rehabilitation  SUBJECTIVE:  SUBJECTIVE STATEMENT:  Pt states he had a busy weekend with all the activities shopping, church, etc.  Pt notes no increase in pain of the L knee, just the usual.   PERTINENT HISTORY:    Pt is a 56 year old male with history of lacunar infarct with left upper extremity weakness and baseline difficulty walking, hypertension, currently on Xarelto, neuropathy, hyperlipidemia chronic back pain neck pain with numbness and tingling who presents emergency department for chief concerns of left-sided weakness. Per MRI impression: "2 cm acute ischemic nonhemorrhagic infarct involving the right  frontal centrum semi ovale".    PAIN: Are you having pain? 0/10 at rest, 6/10 with leg press in neutral position, no pain with IR of the knee  PRECAUTIONS: None  WEIGHT BEARING  RESTRICTIONS: No  FALLS: Has patient fallen in last 6 months? No  LIVING ENVIRONMENT:  Lives with: lives with their spouse Lives in: House/apartment Stairs: Yes: Internal: 13 steps; on right going up and External: 4 steps; can reach both Has following equipment at home: Single point cane   PLOF: Requires assistive device for independence   PATIENT GOALS:   Pt states he wants to be able walk without a limp and a without a cane.  Pt wants to be able to walk up/down stairs with increased confidence and lack of fear of falling.  Pt wants to be able to get back to being able to ride the bike and play golf again.   OBJECTIVE:   DIAGNOSTIC FINDINGS:   EXAM: MRI HEAD WITHOUT CONTRAST  IMPRESSION: 1. 2 cm acute ischemic nonhemorrhagic infarct involving the right frontal centrum semi ovale. 2. Underlying moderately advanced chronic microvascular ischemic disease with a few scattered remote lacunar infarcts about the hemispheric cerebral white matter and thalami.  COGNITION: Overall cognitive status: Within functional limits for tasks assessed   SENSATION: Decreased sensation on the dorsum of the foot and along the medial portion of the L calf  COORDINATION: Pt has coordination deficits when performing the heel-shin method on the L side Pt has coordination deficits when performing the rapid supination/pronation on the L side Decreased thumb to finger rapid touch on the L side  POSTURE: No Significant postural limitations  LOWER EXTREMITY MMT:    MMT Right Eval Left Eval  Hip flexion 5 4-  Hip extension    Hip abduction    Hip adduction    Hip internal rotation    Hip external rotation    Knee flexion 5 4  Knee extension 5 4-  Ankle dorsiflexion 5 3+  Ankle plantarflexion    Ankle inversion    Ankle eversion    (Blank rows = not tested)  TRANSFERS: Assistive device utilized: Single point cane  Sit to stand: Modified independence Stand to sit: Modified  independence Chair to chair: Modified independence  GAIT: Gait pattern: decreased arm swing- Left, decreased step length- Right, decreased stance time- Left, decreased stride length, decreased hip/knee flexion- Left, decreased ankle dorsiflexion- Left, and poor foot clearance- Left Distance walked: 36f Assistive device utilized: None Level of assistance: SBA Comments: Pt stable, just weak on the L side.  FUNCTIONAL TESTS:  5 times sit to stand: 35.72 Timed up and go (TUG): 18.82 sec without AD 6 minute walk test: 535 ft 10 meter walk test: 16.18 Functional gait assessment: 17/30  PATIENT SURVEYS:  FOTO 41/59  TODAY'S TREATMENT:  Therex: *** Hip flexor strength, balance,   Seated leg press, 100# with BLE, 3x10 Standing high amplitude hip flexor marches into BTB resistance to improve contractility necessary for gait Standing lunges onto second step of the stairs for hip flexor stretch and strengthening of the LE's, 2x10 Eccentric lowering from 6" step, 2x10 each LE Static stance on BOSU ball, flat side up, 30 sec bouts Weight shifts forward/backward standing on BOSU ball, flat side up, x10 Weight shifts laterally standing on BOSU ball, flat side up, x10 each side Mini squats while standing on BOSU ball, flat side up, x10       PATIENT EDUCATION: Education details: Pt educated on role of PT and services provided during current POC, along with prognosis and information about the clinic. Person educated: Patient Education method: Explanation Education comprehension: verbalized understanding    HOME EXERCISE PROGRAM:  Access Code: [email or web address] URL: [email or web address] Date: 03/23/2022 Prepared by: [place] Nation  Exercises - Supine Bridge  - 1 x daily - 7 x weekly - 3 sets - 10 reps - Clamshell  - 1 x daily - 7 x weekly - 3 sets - 10 reps - Sidelying Reverse Clamshell  - 1 x daily - 7 x weekly - 3 sets - 10 reps - Sidelying  Bent Knee Lift at 45 Degrees  - 1 x daily - 7 x weekly - 3 sets - 10 reps - Supine Hip Adduction Isometric with Ball  - 1 x daily - 7 x weekly - 3 sets - 10 reps - Supine Figure 4 Piriformis Stretch  - 1 x daily - 7 x weekly - 3 sets - 10 reps - Supine Piriformis Stretch with Leg Straight  - 1 x daily - 7 x weekly - 3 sets - 10 reps   GOALS: Goals reviewed with patient? Yes  SHORT TERM GOALS: Target date: 04/26/2022  Pt will be independent with HEP in order to demonstrate increased ability to perform tasks related to occupation/hobbies. Baseline: Goal status: INITIAL  LONG TERM GOALS: Target date: 06/10/2022  1.  Patient (> 76 years old) will complete five times sit to stand test in < 15 seconds indicating an increased LE strength and improved balance. Baseline: 35.72 sec 04/20/22: 16.01 sec Goal status: PROGRESSING  2.  Patient will increase FOTO score to equal to or greater than 59 to demonstrate statistically significant improvement in mobility and quality of life.  Baseline: 41 04/20/22: 65 Goal status: MET (Continue monitoring)   3.  Patient will increase FGA score by > 4 points to demonstrate decreased fall risk during functional activities. Baseline: 17/30 04/30/22: 23/30 Goal status: PROGRESSING   4.  Patient will reduce timed up and go to <11 seconds to reduce fall risk and demonstrate improved transfer/gait ability. Baseline: 18.82 sec 04/20/22: 10.59 sec Goal status: MET  5.  Patient will increase 10 meter walk test to >1.19ms as to improve gait speed for better community ambulation and to reduce fall risk. Baseline: 16.18 sec - 0.62 m/s 04/20/22: 8.91 sec - 1.12 m/s Goal status: MET  6.  Patient will increase six minute walk test distance to >1000 for progression to community ambulator and improve gait ability Baseline: 535 ft 04/20/22: Will assess at next visit Goal status: PROGRESSING    ASSESSMENT:  CLINICAL IMPRESSION: Pt continues to perform well  with all introduced exercises.  Pt notes some pain with the L knee when performing the step downs and leg press, but is able to tolerate with modification of  leg positioning.  Pt enjoyed doing the BOSU ball exercises and would like to attempt them at next visit on Wednesday.   Pt will continue to benefit from skilled therapy to address remaining deficits in order to improve overall QoL and return to PLOF.       OBJECTIVE IMPAIRMENTS: Abnormal gait, decreased activity tolerance, decreased balance, decreased coordination, decreased mobility, difficulty walking, decreased strength, and impaired UE functional use.   ACTIVITY LIMITATIONS: carrying, lifting, bending, standing, squatting, transfers, bed mobility, bathing, dressing, and locomotion level  PARTICIPATION LIMITATIONS: cleaning, laundry, shopping, community activity, occupation, and yard work  PERSONAL FACTORS: Age, Past/current experiences, Time since onset of injury/illness/exacerbation, and 1-2 comorbidities: stroke, Afib are also affecting patient's functional outcome.   REHAB POTENTIAL: Good  CLINICAL DECISION MAKING: Stable/uncomplicated  EVALUATION COMPLEXITY: Moderate  PLAN:  PT FREQUENCY: 2x/week  PT DURATION: 12 weeks  PLANNED INTERVENTIONS: Therapeutic exercises, Therapeutic activity, Neuromuscular re-education, Balance training, Gait training, Patient/Family education, Self Care, Joint mobilization, Vestibular training, Canalith repositioning, Dry Needling, Spinal mobilization, Cryotherapy, Moist heat, and Manual therapy  PLAN FOR NEXT SESSION:   Continue with assessment of gait, high velocity hip flexor strength and calf tightness as well. Introduce more balance related activities.  Assess compliance with HEP.    Gwenlyn Saran, PT, DPT Physical Therapist- [facility]  04/15/22, 9:33 AM

## 2022-05-04 ENCOUNTER — Ambulatory Visit: Payer: 59

## 2022-05-04 DIAGNOSIS — M6281 Muscle weakness (generalized): Secondary | ICD-10-CM

## 2022-05-04 DIAGNOSIS — R262 Difficulty in walking, not elsewhere classified: Secondary | ICD-10-CM

## 2022-05-04 DIAGNOSIS — R278 Other lack of coordination: Secondary | ICD-10-CM

## 2022-05-04 DIAGNOSIS — R2689 Other abnormalities of gait and mobility: Secondary | ICD-10-CM

## 2022-05-04 NOTE — Therapy (Signed)
OUTPATIENT PHYSICAL THERAPY NEURO TREATMENT   Patient Name: Carlos Nolan MRN: [id number] DOB:December 05, 1965, 56 y.o., male Today's Date: 04/15/2022   PCP: Leonel Ramsay, MD REFERRING PROVIDER: Leonel Ramsay, MD   PT End of Session - 05/04/22 0849     Visit Number 12    Number of Visits 24    Date for PT Re-Evaluation 04/01/22    Progress Note Due on Visit 10    PT Start Time 0848    PT Stop Time 0930    PT Time Calculation (min) 42 min    Equipment Utilized During Treatment Gait belt    Activity Tolerance Patient tolerated treatment well    Behavior During Therapy [facility] for tasks assessed/performed               Past Medical History:  Diagnosis Date   Atrial fibrillation ([place])    COVID-19 01/2019   Sept 2020   Stroke [facility])    03/10/2022   Past Surgical History:  Procedure Laterality Date   APPENDECTOMY     CARDIAC ELECTROPHYSIOLOGY STUDY AND ABLATION  04/24/2015   COLONOSCOPY WITH PROPOFOL N/A 08/11/2020   Procedure: COLONOSCOPY WITH PROPOFOL;  Surgeon: Wilford Corner, MD;  Location: [facility];  Service: Endoscopy;  Laterality: N/A;   ELBOW FRACTURE SURGERY     ESOPHAGOGASTRODUODENOSCOPY N/A 01/11/2016   Procedure: ESOPHAGOGASTRODUODENOSCOPY (EGD);  Surgeon: Mauri Pole, MD;  Location: [facility];  Service: Endoscopy;  Laterality: N/A;   FEMUR FRACTURE SURGERY     HARDWARE REMOVAL Left 08/22/2019   Procedure: HARDWARE REMOVAL FROM LEFT ELBOW;  Surgeon: Corky Mull, MD;  Location: [facility];  Service: Orthopedics;  Laterality: Left;   LOOP RECORDER INSERTION N/A 01/21/2020   Procedure: LOOP RECORDER INSERTION;  Surgeon: Isaias Cowman, MD;  Location: [place] [facility];  Service: Cardiovascular;  Laterality: N/A;   POLYPECTOMY  08/11/2020   Procedure: POLYPECTOMY;  Surgeon: Wilford Corner, MD;  [facility];  Service: Endoscopy;;   SHOULDER ARTHROSCOPY Right 06/15/2016   Procedure: Arthroscopic labral debridement,  arthroscopic subacromial decompression, and mini-open bursectomy with exploration of rotator cuff, right shoulder;  Surgeon: Corky Mull, MD;  Location: [place];  Service: Orthopedics;  Laterality: Right;   TEE WITHOUT CARDIOVERSION N/A 04/05/2022   Procedure: TRANSESOPHAGEAL ECHOCARDIOGRAM (TEE);  Surgeon: Corey Skains, MD;  Location: [facility];  Service: Cardiovascular;  Laterality: N/A;   Patient Active Problem List   Diagnosis Date Noted   Recurrent strokes ([place]) 03/24/2022   Acute ischemic stroke ([place]) 03/10/2022   Essential hypertension 03/10/2022   Hyperlipidemia 03/10/2022   Atrial fibrillation, chronic ([place]) 03/10/2022   History of adenomatous polyp of colon 08/11/2020   Stroke (cerebrum) ([place]) 12/26/2019   Acute thalamic infarction ([place]) 12/25/2019   S/P ablation of atrial fibrillation 12/25/2019   Food impaction of esophagus     ONSET DATE: 03/10/22  REFERRING DIAG:   U27.25 (ICD-10-CM) - Personal history of transient ischemic attack (TIA), and cerebral infarction without residual deficits  I69.90 (ICD-10-CM) - Unspecified sequelae of unspecified cerebrovascular disease    THERAPY DIAG:   Muscle weakness (generalized)  Other lack of coordination  Difficulty in walking, not elsewhere classified  Imbalance  Other abnormalities of gait and mobility  Rationale for Evaluation and Treatment: Rehabilitation  SUBJECTIVE:  SUBJECTIVE STATEMENT:  Pt reports he had the flu last week prior to the holidays.  Pt states he started feeling bad on Wednesday and went to the [employer] urgent care and got sick at work.  Pt stayed out of work Thursday/Friday with it.  Pt notes he was down for the count for the next couple of days.  Pt notes he was able to do 10 miles on the stationary  bike, 1 mile on treadmill walking, 10 minutes on the elliptical without too much difficulty.  Pt was able to go up/down the stairs without much difficulty.   PERTINENT HISTORY:    Pt is a 56 year old male with history of lacunar infarct with left upper extremity weakness and baseline difficulty walking, hypertension, currently on Xarelto, neuropathy, hyperlipidemia chronic back pain neck pain with numbness and tingling who presents emergency department for chief concerns of left-sided weakness. Per MRI impression: "2 cm acute ischemic nonhemorrhagic infarct involving the right  frontal centrum semi ovale".    PAIN:  Are you having pain? 3/10 pain   PRECAUTIONS: None  WEIGHT BEARING RESTRICTIONS: No  FALLS: Has patient fallen in last 6 months? No  LIVING ENVIRONMENT:  Lives with: lives with their spouse Lives in: House/apartment Stairs: Yes: Internal: 13 steps; on right going up and External: 4 steps; can reach both Has following equipment at home: Single point cane   PLOF: Requires assistive device for independence   PATIENT GOALS:   Pt states he wants to be able walk without a limp and a without a cane.  Pt wants to be able to walk up/down stairs with increased confidence and lack of fear of falling.  Pt wants to be able to get back to being able to ride the bike and play golf again.   OBJECTIVE:   DIAGNOSTIC FINDINGS:   EXAM: MRI HEAD WITHOUT CONTRAST  IMPRESSION: 1. 2 cm acute ischemic nonhemorrhagic infarct involving the right frontal centrum semi ovale. 2. Underlying moderately advanced chronic microvascular ischemic disease with a few scattered remote lacunar infarcts about the hemispheric cerebral white matter and thalami.  COGNITION: Overall cognitive status: Within functional limits for tasks assessed   SENSATION: Decreased sensation on the dorsum of the foot and along the medial portion of the L calf  COORDINATION: Pt has coordination deficits when  performing the heel-shin method on the L side Pt has coordination deficits when performing the rapid supination/pronation on the L side Decreased thumb to finger rapid touch on the L side  POSTURE: No Significant postural limitations  LOWER EXTREMITY MMT:    MMT Right Eval Left Eval  Hip flexion 5 4-  Hip extension    Hip abduction    Hip adduction    Hip internal rotation    Hip external rotation    Knee flexion 5 4  Knee extension 5 4-  Ankle dorsiflexion 5 3+  Ankle plantarflexion    Ankle inversion    Ankle eversion    (Blank rows = not tested)  TRANSFERS: Assistive device utilized: Single point cane  Sit to stand: Modified independence Stand to sit: Modified independence Chair to chair: Modified independence  GAIT: Gait pattern: decreased arm swing- Left, decreased step length- Right, decreased stance time- Left, decreased stride length, decreased hip/knee flexion- Left, decreased ankle dorsiflexion- Left, and poor foot clearance- Left Distance walked: 7f Assistive device utilized: None Level of assistance: SBA Comments: Pt stable, just weak on the L side.  FUNCTIONAL TESTS:  5 times sit to stand: 35.72  Timed up and go (TUG): 18.82 sec without AD 6 minute walk test: 535 ft 10 meter walk test: 16.18 Functional gait assessment: 17/30  PATIENT SURVEYS:  FOTO 41/59  TODAY'S TREATMENT:                                 Therex:   Seated leg press, 100# with BLE, 3x15 Standing high amplitude hip flexor marches into BTB resistance to improve contractility necessary for gait Supine hip flexor stretch, 30 sec bouts x multiple attempts Standing lunges onto second step of the stairs for hip flexor stretch and strengthening of the LE's, 2x10 Standing isometric terminal knee extension into rainbow physioball, 3 sec holds, 2x15 each LE Static stand on incline board, 30 sec bouts, x2 Static stance on BOSU ball, flat side up, 30 sec bouts Weight shifts forward/backward  standing on BOSU ball, flat side up, x10 Weight shifts laterally on BOSU ball, flat side up, x10 each side Mini squats while standing on BOSU ball, flat side up, x10    PATIENT EDUCATION: Education details: Pt educated on role of PT and services provided during current POC, along with prognosis and information about the clinic. Person educated: Patient Education method: Explanation Education comprehension: verbalized understanding    HOME EXERCISE PROGRAM:  Access Code: [email or web address] URL: [email or web address] Date: 03/23/2022 Prepared by: [place]  Exercises - Supine Bridge  - 1 x daily - 7 x weekly - 3 sets - 10 reps - Clamshell  - 1 x daily - 7 x weekly - 3 sets - 10 reps - Sidelying Reverse Clamshell  - 1 x daily - 7 x weekly - 3 sets - 10 reps - Sidelying Bent Knee Lift at 45 Degrees  - 1 x daily - 7 x weekly - 3 sets - 10 reps - Supine Hip Adduction Isometric with Ball  - 1 x daily - 7 x weekly - 3 sets - 10 reps - Supine Figure 4 Piriformis Stretch  - 1 x daily - 7 x weekly - 3 sets - 10 reps - Supine Piriformis Stretch with Leg Straight  - 1 x daily - 7 x weekly - 3 sets - 10 reps   GOALS:  Goals reviewed with patient? Yes  SHORT TERM GOALS: Target date: 04/26/2022  Pt will be independent with HEP in order to demonstrate increased ability to perform tasks related to occupation/hobbies. Baseline: Goal status: INITIAL  LONG TERM GOALS: Target date: 06/10/2022  1.  Patient (> 19 years old) will complete five times sit to stand test in < 15 seconds indicating an increased LE strength and improved balance. Baseline: 35.72 sec 04/20/22: 16.01 sec Goal status: PROGRESSING  2.  Patient will increase FOTO score to equal to or greater than 59 to demonstrate statistically significant improvement in mobility and quality of life.  Baseline: 41 04/20/22: 65 Goal status: MET (Continue monitoring)   3.  Patient will increase FGA score by > 4 points to  demonstrate decreased fall risk during functional activities. Baseline: 17/30 04/30/22: 23/30 Goal status: PROGRESSING   4.  Patient will reduce timed up and go to <11 seconds to reduce fall risk and demonstrate improved transfer/gait ability. Baseline: 18.82 sec 04/20/22: 10.59 sec Goal status: MET  5.  Patient will increase 10 meter walk test to >1.36ms as to improve gait speed for better community ambulation and to reduce fall risk. Baseline: 16.18 sec - 0.62 m/s 04/20/22: 8.91  sec - 1.12 m/s Goal status: MET  6.  Patient will increase six minute walk test distance to >1000 for progression to community ambulator and improve gait ability Baseline: 535 ft 04/20/22: Will assess at next visit Goal status: PROGRESSING    ASSESSMENT:  CLINICAL IMPRESSION:  Pt making consistent progress towards goals.  Pt targeted hip flexors and calfs today during session with stretches and pt noted good tolerance, but significant tightness in those areas.  Pt to continue with current strengthening program at home and continue with ambulation to improve overall tolerance with activity.   Pt will continue to benefit from skilled therapy to address remaining deficits in order to improve overall QoL and return to PLOF.        OBJECTIVE IMPAIRMENTS: Abnormal gait, decreased activity tolerance, decreased balance, decreased coordination, decreased mobility, difficulty walking, decreased strength, and impaired UE functional use.   ACTIVITY LIMITATIONS: carrying, lifting, bending, standing, squatting, transfers, bed mobility, bathing, dressing, and locomotion level  PARTICIPATION LIMITATIONS: cleaning, laundry, shopping, community activity, occupation, and yard work  PERSONAL FACTORS: Age, Past/current experiences, Time since onset of injury/illness/exacerbation, and 1-2 comorbidities: stroke, Afib are also affecting patient's functional outcome.   REHAB POTENTIAL: Good  CLINICAL DECISION MAKING:  Stable/uncomplicated  EVALUATION COMPLEXITY: Moderate  PLAN:  PT FREQUENCY: 2x/week  PT DURATION: 12 weeks  PLANNED INTERVENTIONS: Therapeutic exercises, Therapeutic activity, Neuromuscular re-education, Balance training, Gait training, Patient/Family education, Self Care, Joint mobilization, Vestibular training, Canalith repositioning, Dry Needling, Spinal mobilization, Cryotherapy, Moist heat, and Manual therapy  PLAN FOR NEXT SESSION:   Continue with assessment of gait, high velocity hip flexor strength and calf tightness as well. Introduce more balance related activities.  Assess compliance with HEP.    Gwenlyn Saran, PT, DPT Physical Therapist- [facility]  04/15/22, 9:33 AM

## 2022-05-04 NOTE — Therapy (Signed)
OUTPATIENT OCCUPATIONAL THERAPY TREATMENT  Patient Name: Carlos Nolan MRN: [id number] DOB:05/18/1965, 56 y.o., male Today's Date: 05/04/2022  PCP: Dr. Leonel Ramsay (PCP) REFERRING PROVIDER: Dr. Gurney Maxin (Neurologist)   OT End of Session - 05/04/22 0957     Visit Number 8    Number of Visits 24    Date for OT Re-Evaluation 06/15/22    OT Start Time 0935    OT Stop Time 1015    OT Time Calculation (min) 40 min    Activity Tolerance Patient tolerated treatment well    Behavior During Therapy [facility] for tasks assessed/performed            Past Medical History:  Diagnosis Date   Atrial fibrillation ([place])    COVID-19 01/2019   Sept 2020   Stroke [facility])    03/10/2022   Past Surgical History:  Procedure Laterality Date   APPENDECTOMY     CARDIAC ELECTROPHYSIOLOGY STUDY AND ABLATION  04/24/2015   COLONOSCOPY WITH PROPOFOL N/A 08/11/2020   Procedure: COLONOSCOPY WITH PROPOFOL;  Surgeon: Wilford Corner, MD;  Location: [facility] ENDOSCOPY;  Service: Endoscopy;  Laterality: N/A;   ELBOW FRACTURE SURGERY     ESOPHAGOGASTRODUODENOSCOPY N/A 01/11/2016   Procedure: ESOPHAGOGASTRODUODENOSCOPY (EGD);  Surgeon: Mauri Pole, MD;  Location: [facility];  Service: Endoscopy;  Laterality: N/A;   FEMUR FRACTURE SURGERY     HARDWARE REMOVAL Left 08/22/2019   Procedure: HARDWARE REMOVAL FROM LEFT ELBOW;  Surgeon: Corky Mull, MD;  Location: [facility] ORS;  Service: Orthopedics;  Laterality: Left;   LOOP RECORDER INSERTION N/A 01/21/2020   Procedure: LOOP RECORDER INSERTION;  Surgeon: Isaias Cowman, MD;  Location: [facility] LAB;  Service: Cardiovascular;  Laterality: N/A;   POLYPECTOMY  08/11/2020   Procedure: POLYPECTOMY;  Surgeon: Wilford Corner, MD;  Location: [facility]OS[facility];  Service: Endoscopy;;   SHOULDER ARTHROSCOPY Right 06/15/2016   Procedure: Arthroscopic labral debridement, arthroscopic subacromial decompression, and mini-open bursectomy with exploration of rotator  cuff, right shoulder;  Surgeon: Corky Mull, MD;  Location: [place];  Service: Orthopedics;  Laterality: Right;   TEE WITHOUT CARDIOVERSION N/A 04/05/2022   Procedure: TRANSESOPHAGEAL ECHOCARDIOGRAM (TEE);  Surgeon: Carlos Skains, MD;  Location: [facility] ORS;  Service: Cardiovascular;  Laterality: N/A;   Patient Active Problem List   Diagnosis Date Noted   Recurrent strokes ([place]) 03/24/2022   Acute ischemic stroke ([place]) 03/10/2022   Essential hypertension 03/10/2022   Hyperlipidemia 03/10/2022   Atrial fibrillation, chronic ([place]) 03/10/2022   History of adenomatous polyp of colon 08/11/2020   Stroke (cerebrum) ([place]) 12/26/2019   Acute thalamic infarction ([place]) 12/25/2019   S/P ablation of atrial fibrillation 12/25/2019   Food impaction of esophagus     ONSET DATE: 03/10/22  REFERRING DIAG: CVA with L sided weakness  THERAPY DIAG:  Muscle weakness (generalized)  Other lack of coordination  Rationale for Evaluation and Treatment: Rehabilitation  SUBJECTIVE:  SUBJECTIVE STATEMENT: Pt reports he tested positive for the flu last Wednesday and today will be his first day back to work.  Pt reports feeling better but still has a residual cough.  Pt wore a mask today.  Pt accompanied by: self  PERTINENT HISTORY: Per Dr. Freddi Starr note on 03/12/22: [name] is a 56 year old male with history of lacunar infarct with left upper extremity weakness and baseline difficulty walking, hypertension, currently on Xarelto, neuropathy, hyperlipidemia chronic back pain neck pain with numbness and tingling who presented to the ED for evaluation of acute onset  left-sided weakness. He was admitted for stroke evaluation and MRI brain confirmed a 2 cm acute infarct involving the right frontal centrum semi ovale.  CTA head and neck was negative for large vessel occlusions or other significant proximal stenosis. Pt had another stroke in Aug of 2021 with residual numbness in the LUE,  though this had improved.   PRECAUTIONS: Sensory impairment throughout the LUE, fall precautions  WEIGHT BEARING RESTRICTIONS: No  PAIN:  Are you having pain? Yes: NPRS scale: 3/10 Pain location: L side of neck into the shoulder, L upper arm, L hand, L foot Pain description: burning Aggravating factors: increased activity Relieving factors: massage, Gabapentin, rest  FALLS: Has patient fallen in last 6 months? Yes. Number of falls 1 (yesterday, lost balance getting up from chair)  LIVING ENVIRONMENT: Lives with: lives with their spouse Lives in: Other 2 level home, master bedroom on main floor Stairs: Yes: Internal: 13 steps; on right going up, 2 rails to go through garage (4 steps) Has following equipment at home: Single point cane, grab bars in shower, shower chair for walk in shower (no longer having to use)  PLOF: Independent  PATIENT GOALS: Improve coordination in LUE; return to work (IT), golf, wood working  OBJECTIVE:   HAND DOMINANCE: Right  ADLs: Overall ADLs: modified indep/extra time d/t coordination deficits  Transfers/ambulation related to ADLs: indep; recently reduced use of cane.  Not using today. Eating: difficulty cutting food, difficulty opening new packages and containers  Grooming: indep with dominant hand UB Dressing: extra time with clothing fasteners LB Dressing: difficulty tying shoes Toileting: indep Bathing: modified indep Tub Shower transfers: modified indep Equipment:  single point cane, grab bar in shower, shower chair   IADLs: Shopping: using motorized cart in store (supv from spouse) Light housekeeping: spouse manages at baseline Meal Prep: pt not participating in any light meal prep right now d/t balance and coordination deficits Community mobility: short distances, newly walking without cane Medication management: spouse manages meds at baseline Financial management: indep  Handwriting: N/A (pt R hand dominant)  MOBILITY STATUS: Hx of  falls  POSTURE COMMENTS:  No Significant postural limitations Sitting balance: no impairment  ACTIVITY TOLERANCE: Activity tolerance: Pt estimates 1 hour of activity would wear him out.  FUNCTIONAL OUTCOME MEASURES: FOTO: 57, predicted 71  UPPER EXTREMITY ROM:    Active ROM Right eval Left eval  Shoulder flexion 180 130  Shoulder abduction 180 180  Shoulder adduction    Shoulder extension    Shoulder internal rotation    Shoulder external rotation    (Blank rows = not tested)  UPPER EXTREMITY MMT:     MMT Right eval Left eval  Shoulder flexion 4- 5  Shoulder abduction 4- 5  Shoulder adduction    Shoulder extension    Shoulder internal rotation 4- 5  Shoulder external rotation 3+ 5  Middle trapezius    Lower trapezius    Elbow flexion    Elbow extension    Wrist flexion 4 5  Wrist extension 4 5  Wrist ulnar deviation    Wrist radial deviation    Wrist pronation    Wrist supination    (Blank rows = not tested)  HAND FUNCTION: Grip strength: Right: 104 lbs; Left: 64 lbs, Lateral pinch: Right: 23 lbs, Left: 19 lbs, and 3 point pinch: Right: 20 lbs, Left: 11 lbs 04/15/22: Right: 126 lbs,  Left: 92 lbs; Lateral pinch: Right: 29 lbs,  Left: 26 lbs; 3 point pinch: Right: 30+ lbs (  maxed out)  L: 27 lbs  COORDINATION: Finger Nose Finger test: L slow and moderately ataxic  9 Hole Peg test: Right: 26 sec; Left: 44 sec 04/15/22: 9 hole Peg test: Right: 21 sec; L: 27 sec   SENSATION: Light touch: Impaired , numbness/burning in L hand, light touch intact, decreased proprioception in L hand.  PRAXIS: Impaired: Motor planning   TODAY'S TREATMENT:                                                                                                                              Therapeutic Activity: Provided pt with L handed and 2 handed typing practice.  Pt was given 10 words which required mostly L handed typing on keyboard, then provided with short sentences to type with both  hands.  Pt participated in timed 2 min typing test.  1st trial noting 96% accuracy at 26 wpm, 2nd trial at 95% accuracy at 29 wpm.  Donned 1# wrist weight to L wrist and practiced using large strokes of the arm to "air write" upper case alphabet, then lower case. 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   Neuro re-ed: Facilitated L hand [place] and dexterity skills working to scoop up Alex stones from dish to palm, move stones individually from palm to fingertip, and discard stones 1 at a time into small dishes.  Intermittent dropping of stones and min vc for scooping technique.  PATIENT EDUCATION: Education details: LUE GMC/FMC tasks to progress HEP Person educated: Patient Education method: Customer service manager Education comprehension: verbalized understanding, returned demonstration, and needs further education  HOME EXERCISE PROGRAM: Theraputty, cane stretches for shoulder flexibility    GOALS: Goals reviewed with patient? Yes  SHORT TERM GOALS: Target date: 05/04/22  Pt will be indep with HEP for increasing LUE strength and coordination for daily tasks.  Baseline: Initiated theraputty exercises Goal status: INITIAL   LONG TERM GOALS: Target date: 06/15/22  Pt will increase FOTO score to 65 or better to improve self perceived performance with daily tasks.  Baseline: 57  Goal status: INITIAL  2.  Pt will increase L grip strength by 20 or more lbs to improve ability to hold and carry ADL supplies.   Baseline: L grip 64 lbs, R 104 lbs Goal status: INITIAL  3.  Pt will increase L 3 point pinch strength by 5 or more lbs to improve ability to pick up and manipulate small objects.  Baseline: L 11 lbs, R 20 lbs Goal status: INITIAL  4.  Pt will improve L hand FMC (9 hole by 10 sec or more) to improve  efficiency with picking up pills and small ADL supplies from table top.   Baseline: L 9 hole 44 s, R 26 Goal status:  INITIAL  5.  Pt will increase LUE GMC to enable reaching out car window for ATM with good accuracy/steady arm. Baseline: Difficult/moderate ataxia Goal status: INITIAL  6.  Pt will increase LUE strength by 1/2 grade or more to be able to swing a golf club with baseline R/L handling distribution. Baseline: Pt reports he overcompensates with the RUE. Goal status: INITIAL  ASSESSMENT:  CLINICAL IMPRESSION: Pt reports he tested positive for the flu last Wednesday and today will be his first day back to work.  Pt reports feeling better but still has a residual cough.  Pt wore a mask today. Pt demonstrating improvements in hand function but still has difficulty with higher level manipulation and hand skills especially with translatory skills of the hand and using the hand for storage.  Pt continues to experience intermittent dropping of [place] stones when working on storage and translatory skills. Typing skills continue to improve.  Pt participated in 2 trials of a 2 min timed typing test; 1st trial noting 96% accuracy at 26 wpm, 2nd trial at 95% accuracy at 29 wpm.  Pt did well with L wrist weight donned to L wrist when practicing conducting, with good ability to keep in time with a moderate tempo.  Pt will continue to work towards goals in plan of care to improve hand function for necessary daily tasks at work, home and in the community.    PERFORMANCE DEFICITS: in functional skills including ADLs, IADLs, coordination, dexterity, sensation, ROM, strength, pain, Fine motor control, Gross motor control, mobility, balance, endurance, decreased knowledge of precautions, and UE functional use. IMPAIRMENTS: are limiting patient from ADLs, IADLs, work, and leisure.   CO-MORBIDITIES: may have co-morbidities  that affects occupational performance. Patient will benefit from skilled OT to  address above impairments and improve overall function.  MODIFICATION OR ASSISTANCE TO COMPLETE EVALUATION: No modification of tasks or assist necessary to complete an evaluation.  OT OCCUPATIONAL PROFILE AND HISTORY: Problem focused assessment: Including review of records relating to presenting problem.  CLINICAL DECISION MAKING: Moderate - several treatment options, min-mod task modification necessary  REHAB POTENTIAL: Good  EVALUATION COMPLEXITY: Moderate    PLAN:  OT FREQUENCY: 2x/week  OT DURATION: 12 weeks  PLANNED INTERVENTIONS: self care/ADL training, therapeutic exercise, therapeutic activity, neuromuscular re-education, manual therapy, balance training, moist heat, cryotherapy, patient/family education, and DME and/or AE instructions   CONSULTED AND AGREED WITH PLAN OF CARE: Patient  PLAN FOR NEXT SESSION: Continue with ROM, strengthening and coordination tasks for daily activities.   Leta Speller, MS, OTR/L

## 2022-05-06 ENCOUNTER — Ambulatory Visit: Payer: 59 | Admitting: Occupational Therapy

## 2022-05-10 NOTE — Therapy (Unsigned)
[place] [facility] SERVICES [address] [place], [place], [address] Phone: [phone]   Fax:  [phone]  Patient Details  Name: Carlos Nolan MRN: [id number] Date of Birth: 04-15-1966 Referring Provider:  Leonel Ramsay, MD  Encounter Date: 04/15/2022  Patients appt was cancelled in error, and front desk had to schedule another appt. Documentation went to new scheduled appointment.  Christie Nottingham, PT 05/10/2022, 4:08 PM  [place] [facility] SERVICES [address] [place], [place], [address] Phone: [phone]   Fax:  [phone]

## 2022-05-11 ENCOUNTER — Ambulatory Visit: Payer: 59 | Admitting: Occupational Therapy

## 2022-05-11 ENCOUNTER — Ambulatory Visit: Payer: 59

## 2022-05-13 ENCOUNTER — Ambulatory Visit: Payer: 59 | Admitting: Occupational Therapy

## 2022-05-16 ENCOUNTER — Ambulatory Visit: Payer: 59

## 2022-05-18 ENCOUNTER — Ambulatory Visit: Payer: 59

## 2022-05-23 ENCOUNTER — Ambulatory Visit: Payer: 59

## 2022-05-25 ENCOUNTER — Ambulatory Visit: Payer: 59

## 2022-05-30 ENCOUNTER — Ambulatory Visit: Payer: 59

## 2022-06-01 ENCOUNTER — Ambulatory Visit: Payer: 59

## 2022-06-06 ENCOUNTER — Ambulatory Visit: Payer: 59

## 2022-06-08 ENCOUNTER — Ambulatory Visit: Payer: 59

## 2022-06-13 ENCOUNTER — Ambulatory Visit: Payer: 59

## 2022-06-15 ENCOUNTER — Ambulatory Visit: Payer: 59

## 2022-06-15 ENCOUNTER — Encounter: Payer: 59 | Admitting: Occupational Therapy

## 2022-06-20 ENCOUNTER — Ambulatory Visit: Payer: 59

## 2022-06-22 ENCOUNTER — Encounter: Payer: 59 | Admitting: Occupational Therapy

## 2022-06-22 ENCOUNTER — Ambulatory Visit: Payer: 59

## 2022-06-24 ENCOUNTER — Inpatient Hospital Stay: Payer: Self-pay | Attending: Oncology

## 2022-06-27 ENCOUNTER — Ambulatory Visit: Payer: 59

## 2022-06-29 ENCOUNTER — Ambulatory Visit: Payer: 59

## 2022-06-29 ENCOUNTER — Encounter: Payer: 59 | Admitting: Occupational Therapy

## 2022-07-01 ENCOUNTER — Inpatient Hospital Stay: Payer: Self-pay | Admitting: Oncology

## 2022-07-01 NOTE — Assessment & Plan Note (Deleted)
Recurrent stroke while on anticoagulation. It is possible that Xarelto does not provide great pharmacodynamic coverages for him to prevent stroke. Eliquis 5 mg twice daily may be superior.  I agree with switching to Eliquis. Recommend hypercoagulable work-up. Check factor V Leiden mutation, prothrombin gene mutation, ANA, JAK2 V6 13F mutation with reflex to other mutations, vitamin B12, I will hold off checking protein C&S, APS in the acute setting, will check in 3 months.

## 2022-07-04 ENCOUNTER — Ambulatory Visit: Payer: 59

## 2022-07-06 ENCOUNTER — Ambulatory Visit: Payer: 59

## 2022-07-06 ENCOUNTER — Encounter: Payer: 59 | Admitting: Occupational Therapy

## 2022-07-11 ENCOUNTER — Ambulatory Visit: Payer: 59

## 2022-07-13 ENCOUNTER — Ambulatory Visit: Payer: 59

## 2022-07-13 ENCOUNTER — Encounter: Payer: 59 | Admitting: Occupational Therapy

## 2022-07-18 ENCOUNTER — Ambulatory Visit: Payer: 59

## 2022-07-20 ENCOUNTER — Encounter: Payer: 59 | Admitting: Occupational Therapy

## 2022-07-20 ENCOUNTER — Ambulatory Visit: Payer: 59

## 2022-07-25 ENCOUNTER — Ambulatory Visit: Payer: 59

## 2022-07-27 ENCOUNTER — Encounter: Payer: 59 | Admitting: Occupational Therapy

## 2022-07-27 ENCOUNTER — Ambulatory Visit: Payer: 59

## 2022-08-01 ENCOUNTER — Ambulatory Visit: Payer: 59

## 2022-08-03 ENCOUNTER — Ambulatory Visit: Payer: 59

## 2022-08-15 DIAGNOSIS — R251 Tremor, unspecified: Secondary | ICD-10-CM | POA: Diagnosis not present

## 2022-08-15 DIAGNOSIS — R200 Anesthesia of skin: Secondary | ICD-10-CM | POA: Diagnosis not present

## 2022-08-15 DIAGNOSIS — M542 Cervicalgia: Secondary | ICD-10-CM | POA: Diagnosis not present

## 2022-08-15 DIAGNOSIS — R278 Other lack of coordination: Secondary | ICD-10-CM | POA: Diagnosis not present

## 2022-08-15 DIAGNOSIS — G8929 Other chronic pain: Secondary | ICD-10-CM | POA: Diagnosis not present

## 2022-08-15 DIAGNOSIS — M545 Low back pain, unspecified: Secondary | ICD-10-CM | POA: Diagnosis not present

## 2022-08-15 DIAGNOSIS — R270 Ataxia, unspecified: Secondary | ICD-10-CM | POA: Diagnosis not present

## 2022-08-15 DIAGNOSIS — R262 Difficulty in walking, not elsewhere classified: Secondary | ICD-10-CM | POA: Diagnosis not present

## 2022-08-15 DIAGNOSIS — Z8673 Personal history of transient ischemic attack (TIA), and cerebral infarction without residual deficits: Secondary | ICD-10-CM | POA: Diagnosis not present

## 2022-09-14 DIAGNOSIS — R7303 Prediabetes: Secondary | ICD-10-CM | POA: Diagnosis not present

## 2022-09-14 DIAGNOSIS — M545 Low back pain, unspecified: Secondary | ICD-10-CM | POA: Diagnosis not present

## 2022-09-14 DIAGNOSIS — Z8673 Personal history of transient ischemic attack (TIA), and cerebral infarction without residual deficits: Secondary | ICD-10-CM | POA: Diagnosis not present

## 2022-09-14 DIAGNOSIS — Z6831 Body mass index (BMI) 31.0-31.9, adult: Secondary | ICD-10-CM | POA: Diagnosis not present

## 2022-09-14 DIAGNOSIS — G8929 Other chronic pain: Secondary | ICD-10-CM | POA: Diagnosis not present

## 2022-09-14 DIAGNOSIS — I6990 Unspecified sequelae of unspecified cerebrovascular disease: Secondary | ICD-10-CM | POA: Diagnosis not present

## 2022-09-14 DIAGNOSIS — I10 Essential (primary) hypertension: Secondary | ICD-10-CM | POA: Diagnosis not present

## 2022-09-14 DIAGNOSIS — I4892 Unspecified atrial flutter: Secondary | ICD-10-CM | POA: Diagnosis not present

## 2022-09-14 DIAGNOSIS — E6609 Other obesity due to excess calories: Secondary | ICD-10-CM | POA: Diagnosis not present

## 2022-09-21 DIAGNOSIS — R7303 Prediabetes: Secondary | ICD-10-CM | POA: Diagnosis not present

## 2022-09-21 DIAGNOSIS — I6990 Unspecified sequelae of unspecified cerebrovascular disease: Secondary | ICD-10-CM | POA: Diagnosis not present

## 2022-09-21 DIAGNOSIS — M545 Low back pain, unspecified: Secondary | ICD-10-CM | POA: Diagnosis not present

## 2022-09-21 DIAGNOSIS — Z6831 Body mass index (BMI) 31.0-31.9, adult: Secondary | ICD-10-CM | POA: Diagnosis not present

## 2022-09-21 DIAGNOSIS — Z8673 Personal history of transient ischemic attack (TIA), and cerebral infarction without residual deficits: Secondary | ICD-10-CM | POA: Diagnosis not present

## 2022-09-21 DIAGNOSIS — G8929 Other chronic pain: Secondary | ICD-10-CM | POA: Diagnosis not present

## 2022-09-21 DIAGNOSIS — I4892 Unspecified atrial flutter: Secondary | ICD-10-CM | POA: Diagnosis not present

## 2022-09-21 DIAGNOSIS — E6609 Other obesity due to excess calories: Secondary | ICD-10-CM | POA: Diagnosis not present

## 2022-09-21 DIAGNOSIS — I10 Essential (primary) hypertension: Secondary | ICD-10-CM | POA: Diagnosis not present

## 2022-09-21 DIAGNOSIS — Z0000 Encounter for general adult medical examination without abnormal findings: Secondary | ICD-10-CM | POA: Diagnosis not present

## 2023-01-31 DIAGNOSIS — W11XXXA Fall on and from ladder, initial encounter: Secondary | ICD-10-CM | POA: Diagnosis not present

## 2023-01-31 DIAGNOSIS — R0781 Pleurodynia: Secondary | ICD-10-CM | POA: Diagnosis not present

## 2023-01-31 DIAGNOSIS — M25522 Pain in left elbow: Secondary | ICD-10-CM | POA: Diagnosis not present

## 2023-02-15 DIAGNOSIS — L237 Allergic contact dermatitis due to plants, except food: Secondary | ICD-10-CM | POA: Diagnosis not present

## 2023-03-14 DIAGNOSIS — Z6831 Body mass index (BMI) 31.0-31.9, adult: Secondary | ICD-10-CM | POA: Diagnosis not present

## 2023-03-14 DIAGNOSIS — M545 Low back pain, unspecified: Secondary | ICD-10-CM | POA: Diagnosis not present

## 2023-03-14 DIAGNOSIS — E6609 Other obesity due to excess calories: Secondary | ICD-10-CM | POA: Diagnosis not present

## 2023-03-14 DIAGNOSIS — G8929 Other chronic pain: Secondary | ICD-10-CM | POA: Diagnosis not present

## 2023-03-14 DIAGNOSIS — E66811 Obesity, class 1: Secondary | ICD-10-CM | POA: Diagnosis not present

## 2023-03-14 DIAGNOSIS — I6990 Unspecified sequelae of unspecified cerebrovascular disease: Secondary | ICD-10-CM | POA: Diagnosis not present

## 2023-03-14 DIAGNOSIS — I10 Essential (primary) hypertension: Secondary | ICD-10-CM | POA: Diagnosis not present

## 2023-03-14 DIAGNOSIS — Z0000 Encounter for general adult medical examination without abnormal findings: Secondary | ICD-10-CM | POA: Diagnosis not present

## 2023-03-14 DIAGNOSIS — Z8673 Personal history of transient ischemic attack (TIA), and cerebral infarction without residual deficits: Secondary | ICD-10-CM | POA: Diagnosis not present

## 2023-03-14 DIAGNOSIS — I4892 Unspecified atrial flutter: Secondary | ICD-10-CM | POA: Diagnosis not present

## 2023-03-14 DIAGNOSIS — R7303 Prediabetes: Secondary | ICD-10-CM | POA: Diagnosis not present

## 2023-03-23 DIAGNOSIS — Z23 Encounter for immunization: Secondary | ICD-10-CM | POA: Diagnosis not present

## 2023-03-23 DIAGNOSIS — E6609 Other obesity due to excess calories: Secondary | ICD-10-CM | POA: Diagnosis not present

## 2023-03-23 DIAGNOSIS — R7303 Prediabetes: Secondary | ICD-10-CM | POA: Diagnosis not present

## 2023-03-23 DIAGNOSIS — E782 Mixed hyperlipidemia: Secondary | ICD-10-CM | POA: Diagnosis not present

## 2023-03-23 DIAGNOSIS — E66811 Obesity, class 1: Secondary | ICD-10-CM | POA: Diagnosis not present

## 2023-03-23 DIAGNOSIS — I10 Essential (primary) hypertension: Secondary | ICD-10-CM | POA: Diagnosis not present

## 2023-03-23 DIAGNOSIS — Z8673 Personal history of transient ischemic attack (TIA), and cerebral infarction without residual deficits: Secondary | ICD-10-CM | POA: Diagnosis not present

## 2023-03-23 DIAGNOSIS — I4892 Unspecified atrial flutter: Secondary | ICD-10-CM | POA: Diagnosis not present

## 2023-03-23 DIAGNOSIS — Z125 Encounter for screening for malignant neoplasm of prostate: Secondary | ICD-10-CM | POA: Diagnosis not present

## 2023-03-23 DIAGNOSIS — Z6831 Body mass index (BMI) 31.0-31.9, adult: Secondary | ICD-10-CM | POA: Diagnosis not present

## 2023-04-05 DIAGNOSIS — M545 Low back pain, unspecified: Secondary | ICD-10-CM | POA: Diagnosis not present

## 2023-04-05 DIAGNOSIS — M9903 Segmental and somatic dysfunction of lumbar region: Secondary | ICD-10-CM | POA: Diagnosis not present

## 2023-04-05 DIAGNOSIS — M25551 Pain in right hip: Secondary | ICD-10-CM | POA: Diagnosis not present

## 2023-04-05 DIAGNOSIS — M9901 Segmental and somatic dysfunction of cervical region: Secondary | ICD-10-CM | POA: Diagnosis not present

## 2023-04-05 DIAGNOSIS — M546 Pain in thoracic spine: Secondary | ICD-10-CM | POA: Diagnosis not present

## 2023-04-05 DIAGNOSIS — M9902 Segmental and somatic dysfunction of thoracic region: Secondary | ICD-10-CM | POA: Diagnosis not present

## 2023-04-05 DIAGNOSIS — M542 Cervicalgia: Secondary | ICD-10-CM | POA: Diagnosis not present

## 2023-04-10 DIAGNOSIS — M542 Cervicalgia: Secondary | ICD-10-CM | POA: Diagnosis not present

## 2023-04-10 DIAGNOSIS — M9903 Segmental and somatic dysfunction of lumbar region: Secondary | ICD-10-CM | POA: Diagnosis not present

## 2023-04-10 DIAGNOSIS — M9901 Segmental and somatic dysfunction of cervical region: Secondary | ICD-10-CM | POA: Diagnosis not present

## 2023-04-10 DIAGNOSIS — M9902 Segmental and somatic dysfunction of thoracic region: Secondary | ICD-10-CM | POA: Diagnosis not present

## 2023-04-10 DIAGNOSIS — M546 Pain in thoracic spine: Secondary | ICD-10-CM | POA: Diagnosis not present

## 2023-04-12 DIAGNOSIS — M9902 Segmental and somatic dysfunction of thoracic region: Secondary | ICD-10-CM | POA: Diagnosis not present

## 2023-04-12 DIAGNOSIS — M546 Pain in thoracic spine: Secondary | ICD-10-CM | POA: Diagnosis not present

## 2023-04-12 DIAGNOSIS — M9903 Segmental and somatic dysfunction of lumbar region: Secondary | ICD-10-CM | POA: Diagnosis not present

## 2023-04-12 DIAGNOSIS — M542 Cervicalgia: Secondary | ICD-10-CM | POA: Diagnosis not present

## 2023-04-12 DIAGNOSIS — M9901 Segmental and somatic dysfunction of cervical region: Secondary | ICD-10-CM | POA: Diagnosis not present

## 2023-04-13 DIAGNOSIS — I4892 Unspecified atrial flutter: Secondary | ICD-10-CM | POA: Diagnosis not present

## 2023-04-13 DIAGNOSIS — E782 Mixed hyperlipidemia: Secondary | ICD-10-CM | POA: Diagnosis not present

## 2023-04-13 DIAGNOSIS — I10 Essential (primary) hypertension: Secondary | ICD-10-CM | POA: Diagnosis not present

## 2023-04-13 DIAGNOSIS — Z8679 Personal history of other diseases of the circulatory system: Secondary | ICD-10-CM | POA: Diagnosis not present

## 2023-04-13 DIAGNOSIS — Z9889 Other specified postprocedural states: Secondary | ICD-10-CM | POA: Diagnosis not present

## 2023-04-13 DIAGNOSIS — Z8673 Personal history of transient ischemic attack (TIA), and cerebral infarction without residual deficits: Secondary | ICD-10-CM | POA: Diagnosis not present

## 2023-04-13 DIAGNOSIS — Z95818 Presence of other cardiac implants and grafts: Secondary | ICD-10-CM | POA: Diagnosis not present

## 2023-04-14 DIAGNOSIS — M9903 Segmental and somatic dysfunction of lumbar region: Secondary | ICD-10-CM | POA: Diagnosis not present

## 2023-04-14 DIAGNOSIS — M9902 Segmental and somatic dysfunction of thoracic region: Secondary | ICD-10-CM | POA: Diagnosis not present

## 2023-04-14 DIAGNOSIS — M9901 Segmental and somatic dysfunction of cervical region: Secondary | ICD-10-CM | POA: Diagnosis not present

## 2023-04-14 DIAGNOSIS — M546 Pain in thoracic spine: Secondary | ICD-10-CM | POA: Diagnosis not present

## 2023-04-14 DIAGNOSIS — M542 Cervicalgia: Secondary | ICD-10-CM | POA: Diagnosis not present

## 2023-04-17 DIAGNOSIS — M9901 Segmental and somatic dysfunction of cervical region: Secondary | ICD-10-CM | POA: Diagnosis not present

## 2023-04-17 DIAGNOSIS — M9902 Segmental and somatic dysfunction of thoracic region: Secondary | ICD-10-CM | POA: Diagnosis not present

## 2023-04-17 DIAGNOSIS — M542 Cervicalgia: Secondary | ICD-10-CM | POA: Diagnosis not present

## 2023-04-17 DIAGNOSIS — M9903 Segmental and somatic dysfunction of lumbar region: Secondary | ICD-10-CM | POA: Diagnosis not present

## 2023-04-17 DIAGNOSIS — M546 Pain in thoracic spine: Secondary | ICD-10-CM | POA: Diagnosis not present

## 2023-04-19 DIAGNOSIS — E669 Obesity, unspecified: Secondary | ICD-10-CM | POA: Diagnosis not present

## 2023-04-19 DIAGNOSIS — Z791 Long term (current) use of non-steroidal anti-inflammatories (NSAID): Secondary | ICD-10-CM | POA: Diagnosis not present

## 2023-04-19 DIAGNOSIS — M9903 Segmental and somatic dysfunction of lumbar region: Secondary | ICD-10-CM | POA: Diagnosis not present

## 2023-04-19 DIAGNOSIS — G629 Polyneuropathy, unspecified: Secondary | ICD-10-CM | POA: Diagnosis not present

## 2023-04-19 DIAGNOSIS — E785 Hyperlipidemia, unspecified: Secondary | ICD-10-CM | POA: Diagnosis not present

## 2023-04-19 DIAGNOSIS — I10 Essential (primary) hypertension: Secondary | ICD-10-CM | POA: Diagnosis not present

## 2023-04-19 DIAGNOSIS — Z8249 Family history of ischemic heart disease and other diseases of the circulatory system: Secondary | ICD-10-CM | POA: Diagnosis not present

## 2023-04-19 DIAGNOSIS — I4891 Unspecified atrial fibrillation: Secondary | ICD-10-CM | POA: Diagnosis not present

## 2023-04-19 DIAGNOSIS — M542 Cervicalgia: Secondary | ICD-10-CM | POA: Diagnosis not present

## 2023-04-19 DIAGNOSIS — Z880 Allergy status to penicillin: Secondary | ICD-10-CM | POA: Diagnosis not present

## 2023-04-19 DIAGNOSIS — Z7982 Long term (current) use of aspirin: Secondary | ICD-10-CM | POA: Diagnosis not present

## 2023-04-19 DIAGNOSIS — Z7901 Long term (current) use of anticoagulants: Secondary | ICD-10-CM | POA: Diagnosis not present

## 2023-04-19 DIAGNOSIS — M546 Pain in thoracic spine: Secondary | ICD-10-CM | POA: Diagnosis not present

## 2023-04-19 DIAGNOSIS — M9901 Segmental and somatic dysfunction of cervical region: Secondary | ICD-10-CM | POA: Diagnosis not present

## 2023-04-19 DIAGNOSIS — M9902 Segmental and somatic dysfunction of thoracic region: Secondary | ICD-10-CM | POA: Diagnosis not present

## 2023-04-19 DIAGNOSIS — M1990 Unspecified osteoarthritis, unspecified site: Secondary | ICD-10-CM | POA: Diagnosis not present

## 2023-04-19 DIAGNOSIS — D6869 Other thrombophilia: Secondary | ICD-10-CM | POA: Diagnosis not present

## 2023-04-21 DIAGNOSIS — M9901 Segmental and somatic dysfunction of cervical region: Secondary | ICD-10-CM | POA: Diagnosis not present

## 2023-04-21 DIAGNOSIS — M546 Pain in thoracic spine: Secondary | ICD-10-CM | POA: Diagnosis not present

## 2023-04-21 DIAGNOSIS — M542 Cervicalgia: Secondary | ICD-10-CM | POA: Diagnosis not present

## 2023-04-21 DIAGNOSIS — M9902 Segmental and somatic dysfunction of thoracic region: Secondary | ICD-10-CM | POA: Diagnosis not present

## 2023-04-21 DIAGNOSIS — M9903 Segmental and somatic dysfunction of lumbar region: Secondary | ICD-10-CM | POA: Diagnosis not present

## 2023-04-24 DIAGNOSIS — M542 Cervicalgia: Secondary | ICD-10-CM | POA: Diagnosis not present

## 2023-04-24 DIAGNOSIS — M546 Pain in thoracic spine: Secondary | ICD-10-CM | POA: Diagnosis not present

## 2023-04-24 DIAGNOSIS — M9901 Segmental and somatic dysfunction of cervical region: Secondary | ICD-10-CM | POA: Diagnosis not present

## 2023-04-24 DIAGNOSIS — M9902 Segmental and somatic dysfunction of thoracic region: Secondary | ICD-10-CM | POA: Diagnosis not present

## 2023-04-24 DIAGNOSIS — M9903 Segmental and somatic dysfunction of lumbar region: Secondary | ICD-10-CM | POA: Diagnosis not present

## 2023-04-26 DIAGNOSIS — M9903 Segmental and somatic dysfunction of lumbar region: Secondary | ICD-10-CM | POA: Diagnosis not present

## 2023-04-26 DIAGNOSIS — M9901 Segmental and somatic dysfunction of cervical region: Secondary | ICD-10-CM | POA: Diagnosis not present

## 2023-04-26 DIAGNOSIS — M546 Pain in thoracic spine: Secondary | ICD-10-CM | POA: Diagnosis not present

## 2023-04-26 DIAGNOSIS — M542 Cervicalgia: Secondary | ICD-10-CM | POA: Diagnosis not present

## 2023-04-26 DIAGNOSIS — M9902 Segmental and somatic dysfunction of thoracic region: Secondary | ICD-10-CM | POA: Diagnosis not present

## 2023-04-28 DIAGNOSIS — M9902 Segmental and somatic dysfunction of thoracic region: Secondary | ICD-10-CM | POA: Diagnosis not present

## 2023-04-28 DIAGNOSIS — M9901 Segmental and somatic dysfunction of cervical region: Secondary | ICD-10-CM | POA: Diagnosis not present

## 2023-04-28 DIAGNOSIS — M542 Cervicalgia: Secondary | ICD-10-CM | POA: Diagnosis not present

## 2023-04-28 DIAGNOSIS — M9903 Segmental and somatic dysfunction of lumbar region: Secondary | ICD-10-CM | POA: Diagnosis not present

## 2023-04-28 DIAGNOSIS — M546 Pain in thoracic spine: Secondary | ICD-10-CM | POA: Diagnosis not present

## 2023-05-01 DIAGNOSIS — M9903 Segmental and somatic dysfunction of lumbar region: Secondary | ICD-10-CM | POA: Diagnosis not present

## 2023-05-01 DIAGNOSIS — M546 Pain in thoracic spine: Secondary | ICD-10-CM | POA: Diagnosis not present

## 2023-05-01 DIAGNOSIS — M9901 Segmental and somatic dysfunction of cervical region: Secondary | ICD-10-CM | POA: Diagnosis not present

## 2023-05-01 DIAGNOSIS — M542 Cervicalgia: Secondary | ICD-10-CM | POA: Diagnosis not present

## 2023-05-01 DIAGNOSIS — M9902 Segmental and somatic dysfunction of thoracic region: Secondary | ICD-10-CM | POA: Diagnosis not present

## 2023-05-04 DIAGNOSIS — M542 Cervicalgia: Secondary | ICD-10-CM | POA: Diagnosis not present

## 2023-05-04 DIAGNOSIS — M546 Pain in thoracic spine: Secondary | ICD-10-CM | POA: Diagnosis not present

## 2023-05-04 DIAGNOSIS — M9901 Segmental and somatic dysfunction of cervical region: Secondary | ICD-10-CM | POA: Diagnosis not present

## 2023-05-04 DIAGNOSIS — M9902 Segmental and somatic dysfunction of thoracic region: Secondary | ICD-10-CM | POA: Diagnosis not present

## 2023-05-04 DIAGNOSIS — M9903 Segmental and somatic dysfunction of lumbar region: Secondary | ICD-10-CM | POA: Diagnosis not present

## 2023-05-05 DIAGNOSIS — M546 Pain in thoracic spine: Secondary | ICD-10-CM | POA: Diagnosis not present

## 2023-05-05 DIAGNOSIS — M542 Cervicalgia: Secondary | ICD-10-CM | POA: Diagnosis not present

## 2023-05-05 DIAGNOSIS — M9901 Segmental and somatic dysfunction of cervical region: Secondary | ICD-10-CM | POA: Diagnosis not present

## 2023-05-05 DIAGNOSIS — M9903 Segmental and somatic dysfunction of lumbar region: Secondary | ICD-10-CM | POA: Diagnosis not present

## 2023-05-05 DIAGNOSIS — M9902 Segmental and somatic dysfunction of thoracic region: Secondary | ICD-10-CM | POA: Diagnosis not present

## 2023-05-08 DIAGNOSIS — M9903 Segmental and somatic dysfunction of lumbar region: Secondary | ICD-10-CM | POA: Diagnosis not present

## 2023-05-08 DIAGNOSIS — M9902 Segmental and somatic dysfunction of thoracic region: Secondary | ICD-10-CM | POA: Diagnosis not present

## 2023-05-08 DIAGNOSIS — M546 Pain in thoracic spine: Secondary | ICD-10-CM | POA: Diagnosis not present

## 2023-05-08 DIAGNOSIS — M542 Cervicalgia: Secondary | ICD-10-CM | POA: Diagnosis not present

## 2023-05-08 DIAGNOSIS — M9901 Segmental and somatic dysfunction of cervical region: Secondary | ICD-10-CM | POA: Diagnosis not present

## 2023-05-12 DIAGNOSIS — M9902 Segmental and somatic dysfunction of thoracic region: Secondary | ICD-10-CM | POA: Diagnosis not present

## 2023-05-12 DIAGNOSIS — M9903 Segmental and somatic dysfunction of lumbar region: Secondary | ICD-10-CM | POA: Diagnosis not present

## 2023-05-12 DIAGNOSIS — M546 Pain in thoracic spine: Secondary | ICD-10-CM | POA: Diagnosis not present

## 2023-05-12 DIAGNOSIS — M9901 Segmental and somatic dysfunction of cervical region: Secondary | ICD-10-CM | POA: Diagnosis not present

## 2023-05-12 DIAGNOSIS — M542 Cervicalgia: Secondary | ICD-10-CM | POA: Diagnosis not present

## 2023-05-15 DIAGNOSIS — M9901 Segmental and somatic dysfunction of cervical region: Secondary | ICD-10-CM | POA: Diagnosis not present

## 2023-05-15 DIAGNOSIS — M9903 Segmental and somatic dysfunction of lumbar region: Secondary | ICD-10-CM | POA: Diagnosis not present

## 2023-05-15 DIAGNOSIS — M542 Cervicalgia: Secondary | ICD-10-CM | POA: Diagnosis not present

## 2023-05-15 DIAGNOSIS — M546 Pain in thoracic spine: Secondary | ICD-10-CM | POA: Diagnosis not present

## 2023-05-15 DIAGNOSIS — M9902 Segmental and somatic dysfunction of thoracic region: Secondary | ICD-10-CM | POA: Diagnosis not present

## 2023-05-17 DIAGNOSIS — M542 Cervicalgia: Secondary | ICD-10-CM | POA: Diagnosis not present

## 2023-05-17 DIAGNOSIS — M9901 Segmental and somatic dysfunction of cervical region: Secondary | ICD-10-CM | POA: Diagnosis not present

## 2023-05-17 DIAGNOSIS — M9902 Segmental and somatic dysfunction of thoracic region: Secondary | ICD-10-CM | POA: Diagnosis not present

## 2023-05-17 DIAGNOSIS — M546 Pain in thoracic spine: Secondary | ICD-10-CM | POA: Diagnosis not present

## 2023-05-17 DIAGNOSIS — M9903 Segmental and somatic dysfunction of lumbar region: Secondary | ICD-10-CM | POA: Diagnosis not present

## 2023-06-06 ENCOUNTER — Ambulatory Visit: Payer: Self-pay | Admitting: Dietician

## 2023-06-13 ENCOUNTER — Encounter: Payer: 59 | Attending: Infectious Diseases | Admitting: Dietician

## 2023-06-13 VITALS — Ht 73.0 in | Wt 249.5 lb

## 2023-06-13 DIAGNOSIS — Z713 Dietary counseling and surveillance: Secondary | ICD-10-CM | POA: Diagnosis not present

## 2023-06-13 DIAGNOSIS — E66811 Obesity, class 1: Secondary | ICD-10-CM | POA: Insufficient documentation

## 2023-06-13 DIAGNOSIS — E6609 Other obesity due to excess calories: Secondary | ICD-10-CM | POA: Diagnosis present

## 2023-06-13 DIAGNOSIS — Z6832 Body mass index (BMI) 32.0-32.9, adult: Secondary | ICD-10-CM | POA: Insufficient documentation

## 2023-06-13 DIAGNOSIS — E669 Obesity, unspecified: Secondary | ICD-10-CM

## 2023-06-13 DIAGNOSIS — R7303 Prediabetes: Secondary | ICD-10-CM | POA: Diagnosis present

## 2023-06-13 NOTE — Progress Notes (Signed)
 Medical Nutrition Therapy  Appointment Start time:  6  Appointment End time:  86999  Primary concerns today: Prediabetes, Weight loss  Referral diagnosis: R73.03 - Prediabetes , E66.09 - Obesity Preferred learning style: No preference indicated Learning readiness: Contemplating   NUTRITION ASSESSMENT   Anthropometrics: Ht: 73 Wt: 249.5 lbs BMI: 32.92 kg/m2  Clinical Medical Hx: Prediabetes, HTN, HLD, CVA, Afib, Food impaction of esophagus Medications: Eliquis , Gabapentin , Metoprolol, Losartan, Nortriptyline  Labs: A1c - 6.5% Notable Signs/Symptoms: N/A  Lifestyle & Dietary Hx Pt reports concern over rising A1c, does not want to advance from diagnosis of prediabetes to DM. Pt reports having a glucometer at home, but doesn't use it, states they will begin to check glucose if recommended by PCP. Pt reports desire to exercise more, states they have elliptical/stationary bike at home but can't find time/motivation to use them. Pt reports being impatient with progress, states this can interfere with long term success Pt reports tracking food on MyFitnessPal, trying to take in ~2,000 kcal per day, but states a lot of their calories are from refined carbs Pt states they do well with portion control because their wife helps portion meals/snacks for them. Pt reports snacking on sweets nightly (ice cream, cookies, candies) as their biggest hurdle for dietary change.   Estimated daily fluid intake: >64 oz Supplements: O3 Fas, MVI, Vit C, MagOx Sleep: Mild Stress / self-care: Mild Current average weekly physical activity: ADLs, occasionally active   24-Hr Dietary Recall First Meal: Kell[employer]'s strawberry wafer bars, 5-6 strawberries, OJ Snack:  Second Meal: None (Usually PB&J) Snack: Sweet Tea Third Meal: Tilapia w/ a little butter, Asparagus, Sweet Tea Snack: Ice cream, cookies, Beverages: Strawberry Milk, Sweet Tea,    NUTRITION DIAGNOSIS  NB-1.1 Food and nutrition-related  knowledge deficit As related to elevated A1c.  As evidenced by A1c of 6.5%, evening snacking on high carb foods/sweets, very limited physical activity.   NUTRITION INTERVENTION  Nutrition education (E-1) on the following topics:  Educated patient on the pathophysiology of diabetes. This includes why our bodies need circulating blood sugar, the relationship between insulin and blood sugar, and the results of insulin resistance and/or pancreatic insufficiency on the development of diabetes. Educated patient on factors that contribute to elevation of blood sugars, such as stress, illness, injury,and food choices. Discussed the role that physical activity plays in lowering blood sugar. Educate patient on the three main macronutrients. Protein, fats, and carbohydrates. Discussed how each of these macronutrients affect blood sugar levels, especially carbohydrate, and the importance of eating a consistent amount of carbohydrate throughout the day.    Handouts Provided Include  Balanced Plate  Learning Style & Readiness for Change Teaching method utilized: Visual & Auditory  Demonstrated degree of understanding via: Teach Back  Barriers to learning/adherence to lifestyle change: N/A  Goals Established by Pt Work towards eating three meals a day, about 5-6 hours apart! Begin to recognize carbohydrates, proteins, and non-starchy vegetables in your food choices! Begin to build your meals using the proportions of the Balanced Plate. First, select your carb choice(s) for the meal. Make this 25% of your meal. Next, select your source of protein to pair with your carb choice(s). Make this another 25% of your meal. Finally, complete your meal with a variety of non-starchy vegetables. Make this the remaining 50% of your meal. Begin to look into sugar-free desserts and beverages. Choosing low-fat options as often as possible.  If snacking on fruit in the evening, have a cup of low fat greek yogurt  to add for a  source of protein.    MONITORING & EVALUATION Dietary intake, weekly physical activity, and labs in 2 months.  Next Steps  Patient is to follow up with RD.

## 2023-06-13 NOTE — Patient Instructions (Addendum)
 Work towards eating three meals a day, about 5-6 hours apart!  Begin to recognize carbohydrates, proteins, and non-starchy vegetables in your food choices!  Begin to build your meals using the proportions of the Balanced Plate. First, select your carb choice(s) for the meal. Make this 25% of your meal. Next, select your source of protein to pair with your carb choice(s). Make this another 25% of your meal. Finally, complete your meal with a variety of non-starchy vegetables. Make this the remaining 50% of your meal.  Begin to look into sugar-free desserts and beverages. Choosing low-fat options as often as possible.   If snacking on fruit in the evening, have a cup of low fat greek yogurt to add for a source of protein.

## 2023-06-14 ENCOUNTER — Encounter: Payer: Self-pay | Admitting: Dietician

## 2023-07-10 DIAGNOSIS — M542 Cervicalgia: Secondary | ICD-10-CM | POA: Diagnosis not present

## 2023-07-10 DIAGNOSIS — R262 Difficulty in walking, not elsewhere classified: Secondary | ICD-10-CM | POA: Diagnosis not present

## 2023-07-10 DIAGNOSIS — F4321 Adjustment disorder with depressed mood: Secondary | ICD-10-CM | POA: Diagnosis not present

## 2023-07-10 DIAGNOSIS — R278 Other lack of coordination: Secondary | ICD-10-CM | POA: Diagnosis not present

## 2023-07-10 DIAGNOSIS — Z8673 Personal history of transient ischemic attack (TIA), and cerebral infarction without residual deficits: Secondary | ICD-10-CM | POA: Diagnosis not present

## 2023-07-10 DIAGNOSIS — M545 Low back pain, unspecified: Secondary | ICD-10-CM | POA: Diagnosis not present

## 2023-07-10 DIAGNOSIS — R200 Anesthesia of skin: Secondary | ICD-10-CM | POA: Diagnosis not present

## 2023-07-10 DIAGNOSIS — G8929 Other chronic pain: Secondary | ICD-10-CM | POA: Diagnosis not present

## 2023-07-10 DIAGNOSIS — R270 Ataxia, unspecified: Secondary | ICD-10-CM | POA: Diagnosis not present

## 2023-07-27 DIAGNOSIS — I10 Essential (primary) hypertension: Secondary | ICD-10-CM | POA: Diagnosis not present

## 2023-07-27 DIAGNOSIS — Z125 Encounter for screening for malignant neoplasm of prostate: Secondary | ICD-10-CM | POA: Diagnosis not present

## 2023-07-27 DIAGNOSIS — E6609 Other obesity due to excess calories: Secondary | ICD-10-CM | POA: Diagnosis not present

## 2023-07-27 DIAGNOSIS — E782 Mixed hyperlipidemia: Secondary | ICD-10-CM | POA: Diagnosis not present

## 2023-07-27 DIAGNOSIS — E66811 Obesity, class 1: Secondary | ICD-10-CM | POA: Diagnosis not present

## 2023-07-27 DIAGNOSIS — R7303 Prediabetes: Secondary | ICD-10-CM | POA: Diagnosis not present

## 2023-07-27 DIAGNOSIS — I4892 Unspecified atrial flutter: Secondary | ICD-10-CM | POA: Diagnosis not present

## 2023-07-27 DIAGNOSIS — Z6831 Body mass index (BMI) 31.0-31.9, adult: Secondary | ICD-10-CM | POA: Diagnosis not present

## 2023-08-03 DIAGNOSIS — R400 Somnolence: Secondary | ICD-10-CM | POA: Diagnosis not present

## 2023-08-03 DIAGNOSIS — R7303 Prediabetes: Secondary | ICD-10-CM | POA: Diagnosis not present

## 2023-08-03 DIAGNOSIS — E66811 Obesity, class 1: Secondary | ICD-10-CM | POA: Diagnosis not present

## 2023-08-03 DIAGNOSIS — E782 Mixed hyperlipidemia: Secondary | ICD-10-CM | POA: Diagnosis not present

## 2023-08-03 DIAGNOSIS — R5383 Other fatigue: Secondary | ICD-10-CM | POA: Diagnosis not present

## 2023-08-03 DIAGNOSIS — I10 Essential (primary) hypertension: Secondary | ICD-10-CM | POA: Diagnosis not present

## 2023-08-03 DIAGNOSIS — Z8673 Personal history of transient ischemic attack (TIA), and cerebral infarction without residual deficits: Secondary | ICD-10-CM | POA: Diagnosis not present

## 2023-08-03 DIAGNOSIS — Z6831 Body mass index (BMI) 31.0-31.9, adult: Secondary | ICD-10-CM | POA: Diagnosis not present

## 2023-08-03 DIAGNOSIS — I4892 Unspecified atrial flutter: Secondary | ICD-10-CM | POA: Diagnosis not present

## 2023-08-03 DIAGNOSIS — E6609 Other obesity due to excess calories: Secondary | ICD-10-CM | POA: Diagnosis not present

## 2023-08-04 DIAGNOSIS — R5383 Other fatigue: Secondary | ICD-10-CM | POA: Diagnosis not present

## 2023-08-04 DIAGNOSIS — R400 Somnolence: Secondary | ICD-10-CM | POA: Diagnosis not present

## 2023-08-17 ENCOUNTER — Ambulatory Visit: Payer: 59 | Admitting: Dietician

## 2023-10-12 DIAGNOSIS — M1990 Unspecified osteoarthritis, unspecified site: Secondary | ICD-10-CM | POA: Diagnosis not present

## 2023-10-12 DIAGNOSIS — F329 Major depressive disorder, single episode, unspecified: Secondary | ICD-10-CM | POA: Diagnosis not present

## 2023-10-12 DIAGNOSIS — E785 Hyperlipidemia, unspecified: Secondary | ICD-10-CM | POA: Diagnosis not present

## 2023-10-12 DIAGNOSIS — Z7982 Long term (current) use of aspirin: Secondary | ICD-10-CM | POA: Diagnosis not present

## 2023-10-12 DIAGNOSIS — Z823 Family history of stroke: Secondary | ICD-10-CM | POA: Diagnosis not present

## 2023-10-12 DIAGNOSIS — Z880 Allergy status to penicillin: Secondary | ICD-10-CM | POA: Diagnosis not present

## 2023-10-12 DIAGNOSIS — Z8249 Family history of ischemic heart disease and other diseases of the circulatory system: Secondary | ICD-10-CM | POA: Diagnosis not present

## 2023-10-12 DIAGNOSIS — M545 Low back pain, unspecified: Secondary | ICD-10-CM | POA: Diagnosis not present

## 2023-10-12 DIAGNOSIS — I10 Essential (primary) hypertension: Secondary | ICD-10-CM | POA: Diagnosis not present

## 2023-10-12 DIAGNOSIS — G629 Polyneuropathy, unspecified: Secondary | ICD-10-CM | POA: Diagnosis not present

## 2023-10-12 DIAGNOSIS — Z9181 History of falling: Secondary | ICD-10-CM | POA: Diagnosis not present

## 2023-10-12 DIAGNOSIS — E669 Obesity, unspecified: Secondary | ICD-10-CM | POA: Diagnosis not present

## 2023-10-16 DIAGNOSIS — Z95818 Presence of other cardiac implants and grafts: Secondary | ICD-10-CM | POA: Diagnosis not present

## 2023-10-16 DIAGNOSIS — Z79899 Other long term (current) drug therapy: Secondary | ICD-10-CM | POA: Diagnosis not present

## 2023-10-16 DIAGNOSIS — I455 Other specified heart block: Secondary | ICD-10-CM | POA: Diagnosis not present

## 2023-10-16 DIAGNOSIS — Z8673 Personal history of transient ischemic attack (TIA), and cerebral infarction without residual deficits: Secondary | ICD-10-CM | POA: Diagnosis not present

## 2023-10-16 DIAGNOSIS — E782 Mixed hyperlipidemia: Secondary | ICD-10-CM | POA: Diagnosis not present

## 2023-10-16 DIAGNOSIS — I4892 Unspecified atrial flutter: Secondary | ICD-10-CM | POA: Diagnosis not present

## 2023-10-16 DIAGNOSIS — I10 Essential (primary) hypertension: Secondary | ICD-10-CM | POA: Diagnosis not present

## 2023-10-16 DIAGNOSIS — Z5181 Encounter for therapeutic drug level monitoring: Secondary | ICD-10-CM | POA: Diagnosis not present

## 2023-10-16 DIAGNOSIS — Z8679 Personal history of other diseases of the circulatory system: Secondary | ICD-10-CM | POA: Diagnosis not present

## 2023-10-16 DIAGNOSIS — Z9889 Other specified postprocedural states: Secondary | ICD-10-CM | POA: Diagnosis not present

## 2023-10-19 DIAGNOSIS — G4733 Obstructive sleep apnea (adult) (pediatric): Secondary | ICD-10-CM | POA: Diagnosis not present

## 2023-11-14 DIAGNOSIS — G4733 Obstructive sleep apnea (adult) (pediatric): Secondary | ICD-10-CM | POA: Diagnosis not present

## 2023-12-01 DIAGNOSIS — G4733 Obstructive sleep apnea (adult) (pediatric): Secondary | ICD-10-CM | POA: Diagnosis not present

## 2023-12-14 DIAGNOSIS — E6609 Other obesity due to excess calories: Secondary | ICD-10-CM | POA: Diagnosis not present

## 2023-12-14 DIAGNOSIS — R5383 Other fatigue: Secondary | ICD-10-CM | POA: Diagnosis not present

## 2023-12-14 DIAGNOSIS — I10 Essential (primary) hypertension: Secondary | ICD-10-CM | POA: Diagnosis not present

## 2023-12-14 DIAGNOSIS — Z8673 Personal history of transient ischemic attack (TIA), and cerebral infarction without residual deficits: Secondary | ICD-10-CM | POA: Diagnosis not present

## 2023-12-14 DIAGNOSIS — E782 Mixed hyperlipidemia: Secondary | ICD-10-CM | POA: Diagnosis not present

## 2023-12-14 DIAGNOSIS — I4892 Unspecified atrial flutter: Secondary | ICD-10-CM | POA: Diagnosis not present

## 2023-12-14 DIAGNOSIS — R400 Somnolence: Secondary | ICD-10-CM | POA: Diagnosis not present

## 2023-12-14 DIAGNOSIS — R7303 Prediabetes: Secondary | ICD-10-CM | POA: Diagnosis not present

## 2023-12-14 DIAGNOSIS — E66811 Obesity, class 1: Secondary | ICD-10-CM | POA: Diagnosis not present

## 2023-12-14 DIAGNOSIS — Z6831 Body mass index (BMI) 31.0-31.9, adult: Secondary | ICD-10-CM | POA: Diagnosis not present

## 2023-12-15 DIAGNOSIS — G4733 Obstructive sleep apnea (adult) (pediatric): Secondary | ICD-10-CM | POA: Diagnosis not present

## 2023-12-18 DIAGNOSIS — R278 Other lack of coordination: Secondary | ICD-10-CM | POA: Diagnosis not present

## 2023-12-18 DIAGNOSIS — M542 Cervicalgia: Secondary | ICD-10-CM | POA: Diagnosis not present

## 2023-12-18 DIAGNOSIS — F4321 Adjustment disorder with depressed mood: Secondary | ICD-10-CM | POA: Diagnosis not present

## 2023-12-18 DIAGNOSIS — R200 Anesthesia of skin: Secondary | ICD-10-CM | POA: Diagnosis not present

## 2023-12-18 DIAGNOSIS — M545 Low back pain, unspecified: Secondary | ICD-10-CM | POA: Diagnosis not present

## 2023-12-18 DIAGNOSIS — R262 Difficulty in walking, not elsewhere classified: Secondary | ICD-10-CM | POA: Diagnosis not present

## 2023-12-18 DIAGNOSIS — Z8673 Personal history of transient ischemic attack (TIA), and cerebral infarction without residual deficits: Secondary | ICD-10-CM | POA: Diagnosis not present

## 2023-12-18 DIAGNOSIS — R270 Ataxia, unspecified: Secondary | ICD-10-CM | POA: Diagnosis not present

## 2023-12-18 DIAGNOSIS — G8929 Other chronic pain: Secondary | ICD-10-CM | POA: Diagnosis not present

## 2023-12-21 DIAGNOSIS — G4733 Obstructive sleep apnea (adult) (pediatric): Secondary | ICD-10-CM | POA: Diagnosis not present

## 2023-12-21 DIAGNOSIS — R7303 Prediabetes: Secondary | ICD-10-CM | POA: Diagnosis not present

## 2023-12-21 DIAGNOSIS — D721 Eosinophilia, unspecified: Secondary | ICD-10-CM | POA: Diagnosis not present

## 2023-12-21 DIAGNOSIS — I4892 Unspecified atrial flutter: Secondary | ICD-10-CM | POA: Diagnosis not present

## 2023-12-21 DIAGNOSIS — Z8673 Personal history of transient ischemic attack (TIA), and cerebral infarction without residual deficits: Secondary | ICD-10-CM | POA: Diagnosis not present

## 2023-12-21 DIAGNOSIS — I10 Essential (primary) hypertension: Secondary | ICD-10-CM | POA: Diagnosis not present

## 2023-12-21 DIAGNOSIS — E6609 Other obesity due to excess calories: Secondary | ICD-10-CM | POA: Diagnosis not present

## 2023-12-21 DIAGNOSIS — Z6831 Body mass index (BMI) 31.0-31.9, adult: Secondary | ICD-10-CM | POA: Diagnosis not present

## 2023-12-21 DIAGNOSIS — R053 Chronic cough: Secondary | ICD-10-CM | POA: Diagnosis not present

## 2023-12-21 DIAGNOSIS — E782 Mixed hyperlipidemia: Secondary | ICD-10-CM | POA: Diagnosis not present

## 2023-12-21 DIAGNOSIS — E66811 Obesity, class 1: Secondary | ICD-10-CM | POA: Diagnosis not present

## 2023-12-26 DIAGNOSIS — J310 Chronic rhinitis: Secondary | ICD-10-CM | POA: Diagnosis not present

## 2023-12-26 DIAGNOSIS — K219 Gastro-esophageal reflux disease without esophagitis: Secondary | ICD-10-CM | POA: Diagnosis not present

## 2023-12-26 DIAGNOSIS — G4733 Obstructive sleep apnea (adult) (pediatric): Secondary | ICD-10-CM | POA: Diagnosis not present

## 2024-01-15 DIAGNOSIS — G4733 Obstructive sleep apnea (adult) (pediatric): Secondary | ICD-10-CM | POA: Diagnosis not present

## 2024-01-22 DIAGNOSIS — Z5181 Encounter for therapeutic drug level monitoring: Secondary | ICD-10-CM | POA: Diagnosis not present

## 2024-01-22 DIAGNOSIS — Z8679 Personal history of other diseases of the circulatory system: Secondary | ICD-10-CM | POA: Diagnosis not present

## 2024-01-22 DIAGNOSIS — E782 Mixed hyperlipidemia: Secondary | ICD-10-CM | POA: Diagnosis not present

## 2024-01-22 DIAGNOSIS — Z79899 Other long term (current) drug therapy: Secondary | ICD-10-CM | POA: Diagnosis not present

## 2024-01-22 DIAGNOSIS — Z95818 Presence of other cardiac implants and grafts: Secondary | ICD-10-CM | POA: Diagnosis not present

## 2024-01-22 DIAGNOSIS — Z8673 Personal history of transient ischemic attack (TIA), and cerebral infarction without residual deficits: Secondary | ICD-10-CM | POA: Diagnosis not present

## 2024-01-22 DIAGNOSIS — I455 Other specified heart block: Secondary | ICD-10-CM | POA: Diagnosis not present

## 2024-01-22 DIAGNOSIS — I4892 Unspecified atrial flutter: Secondary | ICD-10-CM | POA: Diagnosis not present

## 2024-01-22 DIAGNOSIS — G4733 Obstructive sleep apnea (adult) (pediatric): Secondary | ICD-10-CM | POA: Diagnosis not present

## 2024-01-22 DIAGNOSIS — I480 Paroxysmal atrial fibrillation: Secondary | ICD-10-CM | POA: Diagnosis not present

## 2024-01-22 DIAGNOSIS — Z9889 Other specified postprocedural states: Secondary | ICD-10-CM | POA: Diagnosis not present

## 2024-01-22 DIAGNOSIS — I10 Essential (primary) hypertension: Secondary | ICD-10-CM | POA: Diagnosis not present

## 2024-01-24 DIAGNOSIS — J342 Deviated nasal septum: Secondary | ICD-10-CM | POA: Diagnosis not present

## 2024-01-24 DIAGNOSIS — K219 Gastro-esophageal reflux disease without esophagitis: Secondary | ICD-10-CM | POA: Diagnosis not present

## 2024-01-24 DIAGNOSIS — J301 Allergic rhinitis due to pollen: Secondary | ICD-10-CM | POA: Diagnosis not present

## 2024-02-06 DIAGNOSIS — M1712 Unilateral primary osteoarthritis, left knee: Secondary | ICD-10-CM | POA: Diagnosis not present

## 2024-02-06 DIAGNOSIS — M25562 Pain in left knee: Secondary | ICD-10-CM | POA: Diagnosis not present

## 2024-02-12 DIAGNOSIS — I4892 Unspecified atrial flutter: Secondary | ICD-10-CM | POA: Diagnosis not present

## 2024-02-13 DIAGNOSIS — I480 Paroxysmal atrial fibrillation: Secondary | ICD-10-CM | POA: Diagnosis not present

## 2024-02-23 DIAGNOSIS — M1712 Unilateral primary osteoarthritis, left knee: Secondary | ICD-10-CM | POA: Diagnosis not present

## 2024-02-29 DIAGNOSIS — Z95818 Presence of other cardiac implants and grafts: Secondary | ICD-10-CM | POA: Diagnosis not present

## 2024-02-29 DIAGNOSIS — I4892 Unspecified atrial flutter: Secondary | ICD-10-CM | POA: Diagnosis not present

## 2024-02-29 DIAGNOSIS — I10 Essential (primary) hypertension: Secondary | ICD-10-CM | POA: Diagnosis not present

## 2024-02-29 DIAGNOSIS — I491 Atrial premature depolarization: Secondary | ICD-10-CM | POA: Diagnosis not present

## 2024-02-29 DIAGNOSIS — Z9889 Other specified postprocedural states: Secondary | ICD-10-CM | POA: Diagnosis not present

## 2024-02-29 DIAGNOSIS — Z8679 Personal history of other diseases of the circulatory system: Secondary | ICD-10-CM | POA: Diagnosis not present

## 2024-02-29 DIAGNOSIS — I493 Ventricular premature depolarization: Secondary | ICD-10-CM | POA: Diagnosis not present

## 2024-02-29 DIAGNOSIS — I4729 Other ventricular tachycardia: Secondary | ICD-10-CM | POA: Diagnosis not present

## 2024-02-29 DIAGNOSIS — E782 Mixed hyperlipidemia: Secondary | ICD-10-CM | POA: Diagnosis not present

## 2024-02-29 DIAGNOSIS — Z8673 Personal history of transient ischemic attack (TIA), and cerebral infarction without residual deficits: Secondary | ICD-10-CM | POA: Diagnosis not present

## 2024-03-15 DIAGNOSIS — G4733 Obstructive sleep apnea (adult) (pediatric): Secondary | ICD-10-CM | POA: Diagnosis not present

## 2024-03-15 DIAGNOSIS — Z6831 Body mass index (BMI) 31.0-31.9, adult: Secondary | ICD-10-CM | POA: Diagnosis not present

## 2024-03-15 DIAGNOSIS — I10 Essential (primary) hypertension: Secondary | ICD-10-CM | POA: Diagnosis not present

## 2024-03-15 DIAGNOSIS — D721 Eosinophilia, unspecified: Secondary | ICD-10-CM | POA: Diagnosis not present

## 2024-03-15 DIAGNOSIS — E6609 Other obesity due to excess calories: Secondary | ICD-10-CM | POA: Diagnosis not present

## 2024-03-15 DIAGNOSIS — I4892 Unspecified atrial flutter: Secondary | ICD-10-CM | POA: Diagnosis not present

## 2024-03-15 DIAGNOSIS — E66811 Obesity, class 1: Secondary | ICD-10-CM | POA: Diagnosis not present

## 2024-03-15 DIAGNOSIS — R7303 Prediabetes: Secondary | ICD-10-CM | POA: Diagnosis not present

## 2024-03-15 DIAGNOSIS — R053 Chronic cough: Secondary | ICD-10-CM | POA: Diagnosis not present

## 2024-03-15 DIAGNOSIS — Z8673 Personal history of transient ischemic attack (TIA), and cerebral infarction without residual deficits: Secondary | ICD-10-CM | POA: Diagnosis not present

## 2024-03-15 DIAGNOSIS — E782 Mixed hyperlipidemia: Secondary | ICD-10-CM | POA: Diagnosis not present

## 2024-03-22 DIAGNOSIS — E6609 Other obesity due to excess calories: Secondary | ICD-10-CM | POA: Diagnosis not present

## 2024-03-22 DIAGNOSIS — I10 Essential (primary) hypertension: Secondary | ICD-10-CM | POA: Diagnosis not present

## 2024-03-22 DIAGNOSIS — Z125 Encounter for screening for malignant neoplasm of prostate: Secondary | ICD-10-CM | POA: Diagnosis not present

## 2024-03-22 DIAGNOSIS — G4733 Obstructive sleep apnea (adult) (pediatric): Secondary | ICD-10-CM | POA: Diagnosis not present

## 2024-03-22 DIAGNOSIS — I4892 Unspecified atrial flutter: Secondary | ICD-10-CM | POA: Diagnosis not present

## 2024-03-22 DIAGNOSIS — R7303 Prediabetes: Secondary | ICD-10-CM | POA: Diagnosis not present

## 2024-03-22 DIAGNOSIS — E782 Mixed hyperlipidemia: Secondary | ICD-10-CM | POA: Diagnosis not present

## 2024-03-22 DIAGNOSIS — Z8673 Personal history of transient ischemic attack (TIA), and cerebral infarction without residual deficits: Secondary | ICD-10-CM | POA: Diagnosis not present

## 2024-03-22 DIAGNOSIS — Z23 Encounter for immunization: Secondary | ICD-10-CM | POA: Diagnosis not present

## 2024-03-22 DIAGNOSIS — E66811 Obesity, class 1: Secondary | ICD-10-CM | POA: Diagnosis not present

## 2024-03-22 DIAGNOSIS — D721 Eosinophilia, unspecified: Secondary | ICD-10-CM | POA: Diagnosis not present

## 2024-03-22 DIAGNOSIS — R32 Unspecified urinary incontinence: Secondary | ICD-10-CM | POA: Diagnosis not present

## 2024-03-22 DIAGNOSIS — Z0000 Encounter for general adult medical examination without abnormal findings: Secondary | ICD-10-CM | POA: Diagnosis not present

## 2024-04-15 DIAGNOSIS — G4733 Obstructive sleep apnea (adult) (pediatric): Secondary | ICD-10-CM | POA: Diagnosis not present
